# Patient Record
Sex: Female | Born: 1995
Health system: Southern US, Community
[De-identification: ages and names within clinical notes are randomized; demographics above are authoritative.]

## PROBLEM LIST (undated history)

## (undated) DIAGNOSIS — IMO0002 Reserved for concepts with insufficient information to code with codable children: Secondary | ICD-10-CM

## (undated) DIAGNOSIS — D649 Anemia, unspecified: Secondary | ICD-10-CM

## (undated) DIAGNOSIS — E669 Obesity, unspecified: Secondary | ICD-10-CM

## (undated) DIAGNOSIS — R569 Unspecified convulsions: Secondary | ICD-10-CM

## (undated) DIAGNOSIS — F909 Attention-deficit hyperactivity disorder, unspecified type: Secondary | ICD-10-CM

## (undated) DIAGNOSIS — R55 Syncope and collapse: Secondary | ICD-10-CM

## (undated) DIAGNOSIS — F79 Unspecified intellectual disabilities: Secondary | ICD-10-CM

## (undated) DIAGNOSIS — I10 Essential (primary) hypertension: Secondary | ICD-10-CM

## (undated) DIAGNOSIS — T7840XA Allergy, unspecified, initial encounter: Secondary | ICD-10-CM

## (undated) DIAGNOSIS — E119 Type 2 diabetes mellitus without complications: Secondary | ICD-10-CM

## (undated) HISTORY — DX: Obesity, unspecified: E66.9

## (undated) HISTORY — DX: Syncope and collapse: R55

## (undated) HISTORY — PX: NO PAST SURGERIES: SHX2092

## (undated) HISTORY — DX: Attention-deficit hyperactivity disorder, unspecified type: F90.9

## (undated) HISTORY — DX: Reserved for concepts with insufficient information to code with codable children: IMO0002

## (undated) HISTORY — DX: Anemia, unspecified: D64.9

## (undated) HISTORY — DX: Allergy, unspecified, initial encounter: T78.40XA

## (undated) HISTORY — DX: Type 2 diabetes mellitus without complications: E11.9

## (undated) HISTORY — DX: Unspecified convulsions: R56.9

## (undated) HISTORY — DX: Essential (primary) hypertension: I10

---
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
## 2013-01-11 ENCOUNTER — Telehealth: Payer: Self-pay | Admitting: Family Medicine

## 2013-01-11 NOTE — Telephone Encounter (Signed)
Received Request for prior approval of incontinence supplies. Form includes assessment requiring office visit. Please call and have appointment scheduled. Thank you - RBG

## 2013-01-11 NOTE — Telephone Encounter (Deleted)
Can you please call to schedule an appointment so I can accurately

## 2013-02-11 ENCOUNTER — Other Ambulatory Visit: Payer: Self-pay

## 2013-05-28 ENCOUNTER — Encounter: Payer: Self-pay | Admitting: Pediatrics

## 2013-05-28 ENCOUNTER — Ambulatory Visit (INDEPENDENT_AMBULATORY_CARE_PROVIDER_SITE_OTHER): Payer: Medicaid Other | Admitting: Pediatrics

## 2013-05-28 VITALS — Ht 61.0 in | Wt 252.0 lb

## 2013-05-28 DIAGNOSIS — E669 Obesity, unspecified: Secondary | ICD-10-CM

## 2013-05-28 DIAGNOSIS — F72 Severe intellectual disabilities: Secondary | ICD-10-CM

## 2013-05-28 DIAGNOSIS — B070 Plantar wart: Secondary | ICD-10-CM

## 2013-05-28 DIAGNOSIS — Z68.41 Body mass index (BMI) pediatric, greater than or equal to 95th percentile for age: Secondary | ICD-10-CM

## 2013-05-28 DIAGNOSIS — N912 Amenorrhea, unspecified: Secondary | ICD-10-CM

## 2013-05-28 DIAGNOSIS — Z00129 Encounter for routine child health examination without abnormal findings: Secondary | ICD-10-CM

## 2013-05-28 DIAGNOSIS — L83 Acanthosis nigricans: Secondary | ICD-10-CM

## 2013-05-28 DIAGNOSIS — E1169 Type 2 diabetes mellitus with other specified complication: Secondary | ICD-10-CM | POA: Insufficient documentation

## 2013-05-28 DIAGNOSIS — E119 Type 2 diabetes mellitus without complications: Secondary | ICD-10-CM | POA: Insufficient documentation

## 2013-05-28 LAB — HEMOGLOBIN A1C
HEMOGLOBIN A1C: 6.2 % — AB (ref <5.7)
Mean Plasma Glucose: 131 mg/dL — ABNORMAL HIGH (ref <117)

## 2013-05-28 MED ORDER — METFORMIN HCL 500 MG PO TABS: 500.0000 mg | ORAL_TABLET | Freq: Two times a day (BID) | ORAL | Status: DC

## 2013-05-28 MED ORDER — CETIRIZINE HCL 10 MG PO TABS: 10.0000 mg | ORAL_TABLET | Freq: Every day | ORAL | Status: DC

## 2013-05-28 NOTE — Progress Notes (Signed)
Routine Well-Adolescent Visit   History was provided by the mother.  Amanda Snyder is a 18 y.o. female who is here for Adolescent PE, New patient.  Past Medical History  Diagnosis Date  . Mental retardation 1997    Severe MR. did not walk until age 58-3 years, received Early Intervention.  . Seizures From age 39 month to 6 months    took Phenobarbital for a few years. weaned off after moving from [place] to [place]. Unknown etiology.  . Premature birth with gestation of 35-36 weeks     [redacted] weeks GA  . ADHD (attention deficit hyperactivity disorder) 2000    mom opted not to give medications (failed Adderall - 'zombielike', failed Strattera - 'no difference')  . Obesity 12  . Allergy     suspected allergy to pollen or dog(s)  . Syncope 2006    witnessed by sister, no workup   Current concerns:  (1) sore on bottom of right foot - For about a month, bottom of foot has had a 'sore' on it, as if she stepped on something, but it is not resolving. Not painful, not changing over time.  (2) dark skin on neck  - and "buffalo hump" increasing in size. This has worsened over the past 4-5 years, as patient's weight has "ballooned". She reportedly eats constantly, voracious appetite.  (3) no menstrual period x 6 months - Wears adult diapers at night, when out in public, and during menstrual periods. Mom did do home pregnancy test when patient missed her period (negative). No concerns for sexual abuse, but mom is not with Amanda Snyder 100% of the time (has [employer], attends school).  (4) needs DME ordered (adult diapers, new bed, etc.)  Alvis Lemmings is the CAP provider. Dezyrae has a [employer] 3:30-9:15 Mon-Fri. During school year (more during summer) plus 500-600 hours respite per year.  (5) Patient had a "swollen throat" once which improved with OTC oral benadryl. Since then, Mom treats with Benadryl once a day for suspected 'allergy' to dogs or pollen.   Family history:  Family History  Problem Relation Age of  Onset  . Stroke Mother 65  . Cancer Maternal Grandmother 48    breast ca  . Psoriasis Mother     Adolescent Assessment:  Confidentiality was discussed with the patient and if applicable, with caregiver as well.  Home and Environment:  Lives with: mother and younger sister. Parental relations: Mom is now retired Friends/Peers: limited Nutrition/Eating Behaviors: voracious appetite Sports/Exercise:  Relatively sedentary  Education and Employment:  School Status: Attends [employer] school on [employer] - will age out at 17 years. 5 students in classroom with one [facility] teacher and 2 aides. Has IEP.  Activities: requires assistance with all ADLs.  Sexuality:  -Menarche: post menarchal, onset ? Years ago - females:  last menses: > 6 months ago - Menstrual History: irregular now, previously regular, mom thinks  - Sexually active? no  - sexual partners in last year: 0 - contraception use: no method - Last STI Screening: never  - Violence/Abuse: mom denies  Screenings: PHQ-9 - patient unable to complete due to MR/developmental delays.  Review of Systems:  Constitutional:   Denies fever  Vision: Denies concerns about vision  HENT: Denies concerns about hearing, snoring  Lungs:   Denies difficulty breathing  Heart:   Denies chest pain  Gastrointestinal:   Denies abdominal pain, constipation, diarrhea  Genitourinary:   Denies dysuria  Neurologic:   Denies headaches  Physical Exam:    Filed Vitals:   05/28/13 1454  Height: 5' 1"  (1.549 m)  Weight: 252 lb (114.306 kg)    General Appearance:   alert, oriented, no acute distress, obese and pleasant demeanor, relatively cooperative  HENT: Normocephalic, no obvious abnormality, PERRL, EOM's intact, conjunctiva clear  Mouth:   Normal appearing teeth, no obvious discoloration, dental caries, or dental caps  Neck:   Supple; thyroid: no enlargement, symmetric, no tenderness/mass/nodules  Lungs:    Clear to auscultation bilaterally, normal work of breathing  Heart:   Regular rate and rhythm, S1 and S2 normal, no murmurs;   Abdomen:   Soft, non-tender, significant centripedal obesity  GU genitalia not examined     Lymphatic:   No cervical adenopathy  Skin/Hair/Nails:   Skin warm, dry and intact, no rashes, no bruises or petechiae, central right sole with <1 cm hyperpigmented, scaly round lesion with hard underlying layers of skin  Neurologic:   Strength, gait, and coordination normal and age-appropriate    Assessment/Plan:  1. Well adolescent visit - new patient, establishing care (desires Dr. Tamala Julian at [facility] until ages out of practice at 18 years of age). - GC/chlamydia probe amp, urine  2. Obesity 3. Acanthosis nigricans 4. Amenorrhea - Pregnancy, urine - start Metformin 535m BID - evaluate for possible PCOS - CONSIDER Cushing syndrome for 'buffalo hump', moon facies, weight gain  - labs ordered today: - Luteinizing hormone - DHEA-sulfate - Follicle stimulating hormone - Hemoglobin A1c - Prolactin - Testosterone - TSH - Lipid Profile  5. Plantar wart of right foot - recommended home treatment with duct tape/pumice stone and/or 'compound W'. - handout/information given; no signs of inflammation, low suspicion for foreign body  6. Mental Retardation, Severe - per mom, patient NOT Autistic - Mother maintains guardianship (beyond 160years of age).  Weight management:  The patient was counseled regarding nutrition and physical activity. [facility]  Immunizations today: unable to give recommended vaccines due to age [age]years. [facility], etc[facility] - counseled re: recommended shots.  - Follow-up visit in 3 months for next visit, or sooner as needed.

## 2013-05-28 NOTE — Patient Instructions (Signed)
Metformin tablets What is this medicine? METFORMIN (met FOR min) is used to treat type 2 diabetes. It helps to control blood sugar. Treatment is combined with diet and exercise. This medicine can be used alone or with other medicines for diabetes. This medicine may be used for other purposes; ask your health care provider or pharmacist if you have questions. COMMON BRAND NAME(S): Glucophage What should I tell my health care provider before I take this medicine? They need to know if you have any of these conditions: -anemia -frequently drink alcohol-containing beverages -become easily dehydrated -heart attack -heart failure that is treated with medications -kidney disease -liver disease -polycystic ovary syndrome -serious infection or injury -vomiting -an unusual or allergic reaction to metformin, other medicines, foods, dyes, or preservatives -pregnant or trying to get pregnant -breast-feeding How should I use this medicine? Take this medicine by mouth. Take it with meals. Swallow the tablets with a drink of water. Follow the directions on the prescription label. Take your medicine at regular intervals. Do not take your medicine more often than directed. Talk to your pediatrician regarding the use of this medicine in children. While this drug may be prescribed for children as young as 76 years of age for selected conditions, precautions do apply. Overdosage: If you think you have taken too much of this medicine contact a poison control center or emergency room at once. NOTE: This medicine is only for you. Do not share this medicine with others. What if I miss a dose? If you miss a dose, take it as soon as you can. If it is almost time for your next dose, take only that dose. Do not take double or extra doses. What should I watch for while using this medicine? Visit your doctor or health care professional for regular checks on your progress. A test called the HbA1C (A1C) will be  monitored. This is a simple blood test. It measures your blood sugar control over the last 2 to 3 months. You will receive this test every 3 to 6 months. Learn how to check your blood sugar. Learn the symptoms of low and high blood sugar and how to manage them. Always carry a quick-source of sugar with you in case you have symptoms of low blood sugar. Examples include hard sugar candy or glucose tablets. Make sure others know that you can choke if you eat or drink when you develop serious symptoms of low blood sugar, such as seizures or unconsciousness. They must get medical help at once. Tell your doctor or health care professional if you have high blood sugar. You might need to change the dose of your medicine. If you are sick or exercising more than usual, you might need to change the dose of your medicine. Do not skip meals. Ask your doctor or health care professional if you should avoid alcohol. Many nonprescription cough and cold products contain sugar or alcohol. These can affect blood sugar. This medicine may cause ovulation in premenopausal women who do not have regular monthly periods. This may increase your chances of becoming pregnant. You should not take this medicine if you become pregnant or think you may be pregnant. Talk with your doctor or health care professional about your birth control options while taking this medicine. Contact your doctor or health care professional right away if think you are pregnant. If you are going to need surgery, a MRI, CT scan, or other procedure, tell your doctor that you are taking this medicine. You may need  to stop taking this medicine before the procedure. Wear a medical ID bracelet or chain, and carry a card that describes your disease and details of your medicine and dosage times. What side effects may I notice from receiving this medicine? Side effects that you should report to your doctor or health care professional as soon as possible: -allergic  reactions like skin rash, itching or hives, swelling of the face, lips, or tongue -breathing problems -feeling faint or lightheaded, falls -muscle aches or pains -signs and symptoms of low blood sugar such as feeling anxious, confusion, dizziness, increased hunger, unusually weak or tired, sweating, shakiness, cold, irritable, headache, blurred vision, fast heartbeat, loss of consciousness -slow or irregular heartbeat -unusual stomach pain or discomfort -unusually tired or weak Side effects that usually do not require medical attention (report to your doctor or health care professional if they continue or are bothersome): -diarrhea -headache -heartburn -metallic taste in mouth -nausea -stomach gas, upset This list may not describe all possible side effects. Call your doctor for medical advice about side effects. You may report side effects to FDA at 1-800-FDA-1088. Where should I keep my medicine? Keep out of the reach of children. Store at room temperature between 15 and 30 degrees C (59 and 86 degrees F). Protect from moisture and light. Throw away any unused medicine after the expiration date. NOTE: This sheet is a summary. It may not cover all possible information. If you have questions about this medicine, talk to your doctor, pharmacist, or health care provider.  2014, Elsevier/Gold Standard. (2012-07-07 16:03:44) Plantar Wart Warts are benign (noncancerous) growths of the outer skin layer. They can occur at any time in life but are most common during childhood and the teen years. Warts can occur on many skin surfaces of the body. When they occur on the underside (sole) of your foot they are called plantar warts. They often emerge in groups with several small warts encircling a larger growth. CAUSES  Human papillomavirus (HPV) is the cause of plantar warts. HPV attacks a break in the skin of the foot. Walking barefoot can lead to exposure to the wart virus. Plantar warts tend to  develop over areas of pressure such as the heel and ball of the foot. Plantar warts often grow into the deeper layers of skin. They may spread to other areas of the sole but cannot spread to other areas of the body. SYMPTOMS  You may also notice a growth on the undersurface of your foot. The wart may grow directly into the sole of the foot, or rise above the surface of the skin on the sole of the foot, or both. They are most often flat from pressure. Warts generally do not cause itching but may cause pain in the area of the wart when you put weight on your foot. DIAGNOSIS  Diagnosis is made by physical examination. This means your caregiver discovers it while examining your foot.  TREATMENT  There are many ways to treat plantar warts. However, warts are very tough. Sometimes it is difficult to treat them so that they go away completely and do not grow back. Any treatment must be done regularly to work. If left untreated, most plantar warts will eventually disappear over a period of one to two years. Treatments you can do at home include:  Putting duct tape over the top of the wart (occlusion), has been found to be effective over several months. The duct tape should be removed each night and reapplied until the  wart has disappeared.  Placing over-the-counter medications on top of the wart to help kill the wart virus and remove the wart tissue (salicylic acid, cantharidin, and dichloroacetic acid ) are useful. These are called keratolytic agents. These medications make the skin soft and gradually layers will shed away. Theses compounds are usually placed on the wart each night and then covered with a band-aid. They are also available in pre-medicated band-aid form. Avoid surrounding skin when applying these liquids as these medications can burn healthy skin. The treatment may take several months of nightly use to be effective.  Cryotherapy to freeze the wart has recently become available over-the-counter  for children 4 years and older. This system makes use of a soft narrow applicator connected to a bottle of compressed cold liquid that is applied directly to the wart. This medication can burn health skin and should be used with caution.  As with all over-the-counter medications, read the directions carefully before use. Treatments generally done in your caregiver's office include:  Some aggressive treatments may cause discomfort, discoloration and scaring of the surrounding skin. The risks and benefits of treatment should be discussed with your caregiver.  Freezing the wart with liquid nitrogen (cryotherapy, see above).  Burning the wart with use of very high heat (cautery).  Injecting medication into the wart.  Surgically removing or laser treatment of the wart.  Your caregiver may refer you to a dermatologist for difficult to treat, large sized or large numbers of warts. HOME CARE INSTRUCTIONS   Soak the affected area in warm water. Dry the area completely when you are done. Remove the top layer of softened skin, then apply the chosen topical medication and reapply a bandage.  Remove the bandage daily and file excess wart tissue (pumice stone works well for this purpose). Repeat the entire process daily or every other day for weeks until the plantar wart disappears.  Several brands of salicylic acid pads are available as over-the-counter remedies.  Pain can be relieved by wearing a doughnut bandage. This is a bandage with a hole in it. The bandage is put on with the hole over the wart. This helps take the pressure off the wart and gives pain relief. To help prevent plantar warts:  Wear shoes and socks and change them daily.  Keep feet clean and dry.  Check your feet and your children's feet regularly.  Avoid direct contact with warts on other people.  Have growths, or changes on your skin checked by your caregiver. Document Released: 06/15/2003 Document Revised: 06/17/2011  Document Reviewed: 11/23/2008 [facility] Patient Information 2014 [place].

## 2013-05-28 NOTE — Progress Notes (Signed)
Concerns: Bottom of right foot has a knot, BS checked, menstrual cycle stopped 6 months ago. Unable to obtain BP due to patient moving.

## 2013-05-29 LAB — LIPID PANEL
CHOL/HDL RATIO: 3.7 ratio
CHOLESTEROL: 172 mg/dL (ref 0–200)
HDL: 47 mg/dL (ref >39)
LDL Cholesterol: 108 mg/dL — ABNORMAL HIGH (ref 0–99)
Triglycerides: 85 mg/dL (ref <150)
VLDL: 17 mg/dL (ref 0–40)

## 2013-05-29 LAB — FOLLICLE STIMULATING HORMONE: FSH: 8.3 m[IU]/mL

## 2013-05-29 LAB — LUTEINIZING HORMONE: LH: 15.6 m[IU]/mL

## 2013-05-29 LAB — PROLACTIN: Prolactin: 9.4 ng/mL

## 2013-05-29 LAB — TSH: TSH: 1.811 u[IU]/mL (ref 0.350–4.500)

## 2013-05-29 LAB — TESTOSTERONE: Testosterone: 94 ng/dL — ABNORMAL HIGH (ref 15–40)

## 2013-05-31 LAB — DHEA-SULFATE: DHEA SO4: 230 ug/dL (ref 35–430)

## 2013-06-18 ENCOUNTER — Telehealth: Payer: Self-pay | Admitting: Pediatrics

## 2013-06-18 NOTE — Telephone Encounter (Signed)
[facility] called and stated that they received PT eval and incontinence supply order but there was no amounts for supplies. Medicaid approves 200 pull ups monthly, 150 chux monthly, and 150 wipes every 6 months. Per Dr.McCormick the Medicaid approved amounts were given to [facility] for patient to receive.

## 2013-06-21 NOTE — Telephone Encounter (Signed)
I called this morning and they were going to have the correct person call me back.

## 2013-06-25 NOTE — Telephone Encounter (Signed)
An incontinence supply order was faxed 06/24/2013

## 2013-07-28 ENCOUNTER — Ambulatory Visit: Payer: Medicaid Other | Admitting: Dietician

## 2013-08-05 ENCOUNTER — Encounter: Payer: Self-pay | Admitting: Dietician

## 2013-08-05 ENCOUNTER — Encounter: Payer: Medicaid Other | Attending: Pediatrics | Admitting: Dietician

## 2013-08-05 VITALS — Ht 61.0 in | Wt 261.5 lb

## 2013-08-05 DIAGNOSIS — E669 Obesity, unspecified: Secondary | ICD-10-CM | POA: Insufficient documentation

## 2013-08-05 DIAGNOSIS — F79 Unspecified intellectual disabilities: Secondary | ICD-10-CM | POA: Insufficient documentation

## 2013-08-05 DIAGNOSIS — Z713 Dietary counseling and surveillance: Secondary | ICD-10-CM | POA: Insufficient documentation

## 2013-08-05 DIAGNOSIS — R4701 Aphasia: Secondary | ICD-10-CM | POA: Insufficient documentation

## 2013-08-05 DIAGNOSIS — R7309 Other abnormal glucose: Secondary | ICD-10-CM | POA: Insufficient documentation

## 2013-08-05 NOTE — Progress Notes (Signed)
  Medical Nutrition Therapy:  Appt start time: 1600 end time:  1630.   Assessment:  Primary concerns today: [place] is here today with her mom. She is mentally challenged and I spent most of our visit communicating with her mom. Her mom is concerned because [place] is obese and was recently diagnosed with prediabetes. Mom states that thinks she is getting too much food overall. [place] attends, [employer], a school for the [employer]. [place] receives food at school and canned food to take home.   Preferred Learning Style:   Patient has special needs and unable to communicate  Learning Readiness:   Ready  MEDICATIONS: see list; recently started Metformin   DIETARY INTAKE:  24-hr recall:  B ( AM): School breakfast  Snk ( AM):   L ( PM): school lunch  Snk ( PM): Oodles and Noodles D ( PM): regular family dinner Snk ( PM):   Beverages: soda but trying to drink less, fruit juice, sweet tea  Usual physical activity: none  Estimated energy needs: 2000 calories 225 g carbohydrates 150 g protein 56 g fat  Progress Towards Goal(s):  Some progress.   Nutritional Diagnosis:  [place]-2.2 Altered nutrition-related laboratory As related to obesity, excess energy and carbohydrate intake, and physical inactivity.  As evidenced by diagnosis of prediabetes.    Intervention:  Nutrition counseling provided.  Teaching Method Utilized:  Visual Auditory  Handouts given during visit include:  15g CHO + protein snacks  Barriers to learning/adherence to lifestyle change: mentally challenged  Demonstrated degree of understanding via:  Teach Back   Monitoring/Evaluation:  Dietary intake, exercise, and body weight in 3 month(s).

## 2013-08-05 NOTE — Patient Instructions (Addendum)
-  Reduce sugar in drinks  -Sparkling water, water with lemon or lime, sugarfree flavoring  -Have a small snack afterschool  -Increase physical activity as tolerated

## 2013-08-25 ENCOUNTER — Ambulatory Visit (INDEPENDENT_AMBULATORY_CARE_PROVIDER_SITE_OTHER): Payer: Medicaid Other | Admitting: Pediatrics

## 2013-08-25 ENCOUNTER — Encounter: Payer: Self-pay | Admitting: Pediatrics

## 2013-08-25 VITALS — BP 128/98 | Ht 61.5 in | Wt 259.6 lb

## 2013-08-25 DIAGNOSIS — R030 Elevated blood-pressure reading, without diagnosis of hypertension: Secondary | ICD-10-CM

## 2013-08-25 DIAGNOSIS — N912 Amenorrhea, unspecified: Secondary | ICD-10-CM

## 2013-08-25 DIAGNOSIS — F72 Severe intellectual disabilities: Secondary | ICD-10-CM

## 2013-08-25 DIAGNOSIS — L83 Acanthosis nigricans: Secondary | ICD-10-CM

## 2013-08-25 DIAGNOSIS — E669 Obesity, unspecified: Secondary | ICD-10-CM

## 2013-08-25 DIAGNOSIS — I10 Essential (primary) hypertension: Secondary | ICD-10-CM | POA: Insufficient documentation

## 2013-08-25 NOTE — Progress Notes (Signed)
History was provided by the patient and Paediatric nurse.  Amanda Snyder is a 18 y.o. female who is here for follow up of her Obesity, Acanthosis Nigricans, and Lab results (drawn last visit), amenorrhea.  HPI:  Amanda Snyder was last seen 3 months ago. Since then, she did start Metformin 547m, but mom has only been giving once daily instead of BID. She continues to not have a menstrual period (last one ~ a year ago?). She never collected a urine sample as requested at last visit. She has gained 7 lbs since last visit (over 3 months), but mom does report trying to eat more green leafy vegetables, and they have attended one Nutrition Counseling session, with plan to return again.  Reviewed lab results from February with mother. Answered questions, explained elevated testosterone, increasing HgbA1C compared to last lab value.  Patient Active Problem List   Diagnosis Date Noted  . Acanthosis nigricans 05/28/2013  . Amenorrhea 05/28/2013  . Hyperglycemia 12/26/2011  . Incontinence 09/06/2011  . Paronychia 08/22/2010  . Obesity 08/08/2010  . Kyphosis 08/08/2010  . ADHD 12/18/2006  . RETARDATION, MENTAL, SEVERE 12/18/2006    Current Outpatient Prescriptions on File Prior to Visit  Medication Sig Dispense Refill  . cetirizine (ZYRTEC) 10 MG tablet Take 1 tablet (10 mg total) by mouth daily.  30 tablet  2  . metFORMIN (GLUCOPHAGE) 500 MG tablet Take 1 tablet (500 mg total) by mouth 2 (two) times daily with a meal.  60 tablet  3   No current facility-administered medications on file prior to visit.    The following portions of the patient's history were reviewed and updated as appropriate: allergies, current medications, past medical history, past social history and problem list.  Physical Exam:    Filed Vitals:   08/25/13 1653  BP: 128/98  Height: 5' 1.5" (1.562 m)  Weight: 259 lb 9.6 oz (117.754 kg)   Growth parameters are noted and are not appropriate for age. Facility age limit for  growth percentiles is 20 years. No LMP recorded.    General:   cooperative, morbidly obese and slowed mentation     Skin:   hyperpigmented, thickened skin on posterior neck              Lungs:  clear to auscultation bilaterally  Heart:   regular rate and rhythm, S1, S2 normal, no murmur, click, rub or gallop                Assessment/Plan:  Amanda Snyder was seen today for follow-up.  Diagnoses and associated orders for this visit:  Obesity - Ambulatory referral to Adolescent Medicine  Amenorrhea - Ambulatory referral to Adolescent Medicine  Acanthosis nigricans - Ambulatory referral to Adolescent Medicine  Elevated blood pressure reading without diagnosis of hypertension - Ambulatory referral to Adolescent Medicine  RETARDATION, MENTAL, SEVERE - Ambulatory referral to Adolescent Medicine   - Immunizations today: none  - Follow-up visit in 3 months for recheck Weight, or sooner as needed.   Time spent: >35 minutes face to face, with >50% counseling on medication recommendations, explanations of lab results including medical significance of each, weight loss counseling including dietary goal-setting and exercise goal-setting, importance of blood pressure monitoring, and discussion around adolescent medicine services referral.

## 2013-08-25 NOTE — Patient Instructions (Signed)
Polycystic Ovarian Syndrome Polycystic ovarian syndrome (PCOS) is a common hormonal disorder among women of reproductive age. Most women with PCOS grow many small cysts on their ovaries. PCOS can cause problems with your periods and make it difficult to get pregnant. It can also cause an increased risk of miscarriage with pregnancy. If left untreated, PCOS can lead to serious health problems, such as diabetes and heart disease. CAUSES The cause of PCOS is not fully understood, but genetics may be a factor. SIGNS AND SYMPTOMS   Infrequent or no menstrual periods.   Inability to get pregnant (infertility) because of not ovulating.   Increased growth of hair on the face, chest, stomach, back, thumbs, thighs, or toes.   Acne, oily skin, or dandruff.   Pelvic pain.   Weight gain or obesity, usually carrying extra weight around the waist.   Type 2 diabetes.   High cholesterol.   High blood pressure.   Female-pattern baldness or thinning hair.   Patches of thickened and dark brown or black skin on the neck, arms, breasts, or thighs.   Tiny excess flaps of skin (skin tags) in the armpits or neck area.   Excessive snoring and having breathing stop at times while asleep (sleep apnea).   Deepening of the voice.   Gestational diabetes when pregnant.  DIAGNOSIS  There is no single test to diagnose PCOS.   Your health care provider will:   Take a medical history.   Perform a pelvic exam.   Have ultrasonography done.   Check your female and female hormone levels.   Measure glucose or sugar levels in the blood.   Do other blood tests.   If you are producing too many female hormones, your health care provider will make sure it is from PCOS. At the physical exam, your health care provider will want to evaluate the areas of increased hair growth. Try to allow natural hair growth for a few days before the visit.   During a pelvic exam, the ovaries may be enlarged  or swollen because of the increased number of small cysts. This can be seen more easily by using vaginal ultrasonography or screening to examine the ovaries and lining of the uterus (endometrium) for cysts. The uterine lining may become thicker if you have not been having a regular period.  TREATMENT  Because there is no cure for PCOS, it needs to be managed to prevent problems. Treatments are based on your symptoms. Treatment is also based on whether you want to have a baby or whether you need contraception.  Treatment may include:   Progesterone hormone to start a menstrual period.   Birth control pills to make you have regular menstrual periods.   Medicines to make you ovulate, if you want to get pregnant.   Medicines to control your insulin.   Medicine to control your blood pressure.   Medicine and diet to control your high cholesterol and triglycerides in your blood.  Medicine to reduce excessive hair growth.  Surgery, making small holes in the ovary, to decrease the amount of female hormone production. This is done through a long, lighted tube (laparoscope) placed into the pelvis through a tiny incision in the lower abdomen.  HOME CARE INSTRUCTIONS  Only take over-the-counter or prescription medicine as directed by your health care provider.  Pay attention to the foods you eat and your activity levels. This can help reduce the effects of PCOS.  Keep your weight under control.  Eat foods that are  low in carbohydrate and high in fiber.  Exercise regularly. SEEK MEDICAL CARE IF:  Your symptoms do not get better with medicine.  You have new symptoms. Document Released: 07/19/2004 Document Revised: 01/13/2013 Document Reviewed: 09/10/2012 [facility] Patient Information 2014 [place], [place]. Cholesterol Cholesterol is a type of fat. Your body needs a small amount of cholesterol, but too much can cause health problems. Certain problems include heart attacks, strokes, and not  enough blood flow to your heart, brain, kidneys, or feet. You get cholesterol in 2 ways:  Naturally.  By eating certain foods. HOME CARE  Eat a low-fat diet:  Eat less eggs, whole dairy products (whole milk, cheese, and butter), fatty meats, and fried foods.  Eat more fruits, vegetables, whole-wheat breads, lean chicken, and fish.  Follow your exercise program as told by your doctor.  Keep your weight at a healthy level. Talk to your doctor about what is right for you.  Only take medicine as told by your doctor.  Get your cholesterol checked once a year or as told by your doctor. MAKE SURE YOU:  Understand these instructions.  Will watch your condition.  Will get help right away if you are not doing well or get worse. Document Released: 06/21/2008 Document Revised: 06/17/2011 Document Reviewed: 01/06/2013 [facility] Patient Information 2014 [place], [place].

## 2013-09-21 ENCOUNTER — Other Ambulatory Visit: Payer: Self-pay | Admitting: Pediatrics

## 2013-09-21 DIAGNOSIS — T782XXA Anaphylactic shock, unspecified, initial encounter: Secondary | ICD-10-CM

## 2013-09-21 MED ORDER — EPINEPHRINE 0.3 MG/0.3ML IJ SOAJ
0.3000 mg | Freq: Once | INTRAMUSCULAR | Status: DC | PRN
Start: 2013-09-21 — End: 2014-11-02

## 2013-09-21 NOTE — Progress Notes (Signed)
Mom informed that last week patient had abrupt onset of shortness of breath, vomiting, and diarrhea immediately after ingestion of meal with spinache and crab meat.  Mom gave benadryl which partially relieved symptoms.   -Will make referral to allergy. -Rx provided for Epi Pen.       Janit Bern, MD [facility], PGY-2 09/21/2013 5:29 PM

## 2013-10-29 ENCOUNTER — Other Ambulatory Visit: Payer: Self-pay | Admitting: Pediatrics

## 2013-11-11 ENCOUNTER — Encounter: Payer: Self-pay | Admitting: Pediatrics

## 2013-11-11 ENCOUNTER — Ambulatory Visit (INDEPENDENT_AMBULATORY_CARE_PROVIDER_SITE_OTHER): Payer: Medicaid Other | Admitting: Pediatrics

## 2013-11-11 VITALS — BP 106/74 | Ht 62.56 in | Wt 253.0 lb

## 2013-11-11 DIAGNOSIS — Z789 Other specified health status: Secondary | ICD-10-CM | POA: Insufficient documentation

## 2013-11-11 DIAGNOSIS — E282 Polycystic ovarian syndrome: Secondary | ICD-10-CM

## 2013-11-11 DIAGNOSIS — T7800XA Anaphylactic reaction due to unspecified food, initial encounter: Secondary | ICD-10-CM | POA: Insufficient documentation

## 2013-11-11 DIAGNOSIS — F909 Attention-deficit hyperactivity disorder, unspecified type: Secondary | ICD-10-CM

## 2013-11-11 DIAGNOSIS — F72 Severe intellectual disabilities: Secondary | ICD-10-CM

## 2013-11-11 DIAGNOSIS — L83 Acanthosis nigricans: Secondary | ICD-10-CM

## 2013-11-11 LAB — HEMOGLOBIN A1C
Hgb A1c MFr Bld: 5.8 % — ABNORMAL HIGH (ref <5.7)
Mean Plasma Glucose: 120 mg/dL — ABNORMAL HIGH (ref <117)

## 2013-11-11 LAB — COMPREHENSIVE METABOLIC PANEL
ALK PHOS: 73 U/L (ref 39–117)
ALT: 80 U/L — ABNORMAL HIGH (ref 0–35)
AST: 39 U/L — ABNORMAL HIGH (ref 0–37)
Albumin: 4.3 g/dL (ref 3.5–5.2)
BILIRUBIN TOTAL: 0.3 mg/dL (ref 0.2–1.2)
BUN: 8 mg/dL (ref 6–23)
CO2: 27 mEq/L (ref 19–32)
Calcium: 10.3 mg/dL (ref 8.4–10.5)
Chloride: 100 mEq/L (ref 96–112)
Creat: 0.62 mg/dL (ref 0.50–1.10)
GLUCOSE: 93 mg/dL (ref 70–99)
Potassium: 4.1 mEq/L (ref 3.5–5.3)
SODIUM: 136 meq/L (ref 135–145)
Total Protein: 7.2 g/dL (ref 6.0–8.3)

## 2013-11-11 LAB — LIPID PANEL
CHOL/HDL RATIO: 3.7 ratio
Cholesterol: 154 mg/dL (ref 0–200)
HDL: 42 mg/dL (ref >39)
LDL CALC: 84 mg/dL (ref 0–99)
TRIGLYCERIDES: 140 mg/dL (ref <150)
VLDL: 28 mg/dL (ref 0–40)

## 2013-11-11 MED ORDER — METFORMIN HCL ER 500 MG PO TB24: 500.0000 mg | ORAL_TABLET | Freq: Every day | ORAL | Status: DC

## 2013-11-11 NOTE — Patient Instructions (Signed)
Take a multivitamin every day when you are on Metformin. Also take metformin with food.  Avoid starchy foods when you are taking Metformin.  Take Metformin XR 500 mg 1 pill at dinner once daily for 2 weeks  Then, take Metformin XR 500 mg 2 pills at dinner once daily for 2 weeks  Then, take Metformin XR 500 mg 3 pills at dinner once daily until you see the doctor for your next visit.  If you have too much nausea or diarrhea, decrease your dose for 2 weeks and then try to go back up again.

## 2013-11-11 NOTE — Progress Notes (Signed)
Adolescent Medicine Consultation Initial Visit Amanda Snyder  is a 18 y.o. female referred by Dr. Tamala Julian here today for evaluation of PCOS and to establish care.      PCP Confirmed?  yes  Omara Alcon, Victorino December, MD   History was provided by the patient, mother and CAP worker.  Chart review:  Seen for [facility] with Dr. Tamala Julian 05/28/2013.  She was start on metformin at that visit and evaluated for PCOS.  Labs are reviewed below.  She was seen by Dr. Drue Flirt on 08/25/13 and referred to [facility] for further evaluation.    Last STI screen: Not previously performed, difficulty with obtaining specimen. Pertinent Labs:  Component     Latest Ref Rng 05/28/2013  Cholesterol     0 - 200 mg/dL 172  Triglycerides     <150 mg/dL 85  HDL     >39 mg/dL 47  Total CHOL/HDL Ratio      3.7  VLDL     0 - 40 mg/dL 17  LDL (calc)     0 - 99 mg/dL 108 (H)  Hemoglobin A1C     <5.7 % 6.2 (H)  Mean Plasma Glucose     <117 mg/dL 131 (H)  LH      15.6  DHEA-SO4     35 - 430 ug/dL 230  FSH      8.3  Prolactin      9.4  Testosterone     15 - 40 ng/dL 94 (H)  TSH     0.350 - 4.500 uIU/mL 1.811   Previous Psych Screenings:  Not applicable due to severe intellectual disability Immunizations: Further review needed  Psych screenings completed for today's visit: None  HPI:  Pt reports she is here with concerned related to PCOS.  Report patient has mild acne.  No hirsutism.  Had a period recently, but had been amenorrheic prior to that for a long time. Had been giving once daily Metformin and then increased to twice daily but then recently stopped.  Mother felt unsure about giving it.  Noted a lot of GI upset with it.  They are working on dietary changes also.  They have decreased frequency of food intake.  Likes chik-filet and ice cream.  They are trying to limit that.  They are also thinking about joining a gym and having CAP workers take her there regularly.  Born with seizures, previously saw Dr.  Gaynell Face. Has previous diagnosis of ADHD, intellectual disability.  Tried Adderal and tried strattera.  Not on any medication for behavioral issues recently.  They are generally able to control her behaviors.  They are not sure whether she has autism.  Wt Readings from Last 3 Encounters:  11/11/13 253 lb (114.76 kg)  08/25/13 259 lb 9.6 oz (117.754 kg)  08/05/13 261 lb 8 oz (118.616 kg)   Patient's last menstrual period was 10/26/2013.  ROS per HPI  The following portions of the patient's history were reviewed and updated as appropriate: allergies, current medications, past family history, past medical history, past social history, past surgical history and problem list.  No Known Allergies  Physical Exam:  Filed Vitals:   11/11/13 1344  BP: 106/74  Height: 5' 2.56" (1.589 m)  Weight: 253 lb (114.76 kg)   BP 106/74  Ht 5' 2.56" (1.589 m)  Wt 253 lb (114.76 kg)  BMI 45.45 kg/m2  LMP 10/26/2013 Body mass index: body mass index is 45.45 kg/(m^2). Facility age limit for growth percentiles is 20  years.  Wt Readings from Last 3 Encounters:  11/11/13 253 lb (114.76 kg)  08/25/13 259 lb 9.6 oz (117.754 kg)  08/05/13 261 lb 8 oz (118.616 kg)   Physical Exam  Constitutional:  Alert, very active, some echolalia and intermittent agitation associated with uncertainty about the medical visit process  Neck: No thyromegaly present.  Cardiovascular: Normal rate and regular rhythm.   No murmur heard. Pulmonary/Chest: Breath sounds normal.  Abdominal: Soft. There is no tenderness. There is no guarding.  obese  Genitourinary:  External genital normal, brief view of clitorus showed mild enlargement  Musculoskeletal: She exhibits no edema.  ?buffalo hump vs. kyphosis  Lymphadenopathy:    She has no cervical adenopathy.  Skin: No rash noted.   Assessment/Plan: 1. PCOS (polycystic ovarian syndrome) 2. Acanthosis nigricans 3. Morbid obesity Labs are consistent with PCOS.  Dietary  changes already occuring and reinforced the importance of continued attention to diet and exercise.  Consider nutrition evaluation in future.  Advised that metformin is indicated given elevated hgba1c and PCOS.  Advised long-acting metformin may create fewer GI side effects.  Will start at 500 and increase slowly.  Advised to take with MVI.  Will evaluate further for comorbidities of PCOS as well as rule out any adrenal source of excess androgens. - Comprehensive metabolic panel - Vit D  25 hydroxy (rtn osteoporosis monitoring); Future - Lipid panel - Hemoglobin A1c - Cortisol - metFORMIN (GLUCOPHAGE XR) 500 MG 24 hr tablet; Take 1 tablet (500 mg total) by mouth daily with breakfast. And then increase as instructed by your doctor.  Dispense: 90 tablet; Refill: 1  4. RETARDATION, MENTAL, SEVERE 5.  ADHD Diagnosis unclear.  ?whether genetics eval indicated.  May not be at this point if not going to change any management decisions.  Behavior is intermittently challenging and want to ensure that family has clear understanding of diagnosis and long-term treatment needs. - Ambulatory referral to Development Ped  6. Anaphylaxis due to food, initial encounter Mother reported anaphylactic reaction to food.  Given limited verbal abilities of the patient, further testing and evaluation is indicated.  Family and caregivers have an epi-pen. - Ambulatory referral to Allergy  REQUESTS NO RESIDENTS IN FUTURE  Follow-up:  1 month  Medical decision-making:  > 45 minutes spent, more than 50% of appointment was spent discussing diagnosis and management of symptoms

## 2013-11-12 LAB — CORTISOL: Cortisol, Plasma: 13.2 ug/dL

## 2013-11-19 ENCOUNTER — Telehealth: Payer: Self-pay | Admitting: Pediatrics

## 2013-11-19 NOTE — Telephone Encounter (Signed)
Mom would like to know if you can please write her a letter stating that this pt is handicap so she can mail it to the city of [place]. Mom stated that she need this letter because they are going to accommodate her bathroom for the pt with a bigger toilet & bathtub, but they need the letter first.

## 2013-11-22 ENCOUNTER — Encounter: Payer: Medicare Other | Attending: Pediatrics | Admitting: Dietician

## 2013-11-22 DIAGNOSIS — Z713 Dietary counseling and surveillance: Secondary | ICD-10-CM | POA: Diagnosis not present

## 2013-11-22 NOTE — Progress Notes (Signed)
  Medical Nutrition Therapy:  Appt start time: 200 end time:  230.   Follow-up: Amanda Snyder returns with her sister, mom, and CAP worker. Has fluctuated in weight a little but overall has lost 1 pound since last visit in April. Her caregivers report feeding her 3 meals a day instead of 5. CAP worker is doing things to cause her to walk more, like parking father away from entrances. Menstrual cycle has come back and blood pressure has improved. Drinking more green tea and less soda. Having fruit snacks and chickfila ice cream occasionally afterschool.   Preferred Learning Style:   Patient has special needs and unable to communicate  Learning Readiness:   Ready  MEDICATIONS: see list; recently started Metformin   DIETARY INTAKE:  24-hr recall:  B ( AM): School breakfast  Snk ( AM):   L ( PM): school lunch  Snk ( PM): Oodles and Noodles D ( PM): regular family dinner Snk ( PM):   Beverages: soda but trying to drink less, fruit juice, sweet tea  Usual physical activity: none  Estimated energy needs: 2000 calories 225 g carbohydrates 150 g protein 56 g fat  Progress Towards Goal(s):  Some progress.   Nutritional Diagnosis:  [place]-2.2 Altered nutrition-related laboratory As related to obesity, excess energy and carbohydrate intake, and physical inactivity.  As evidenced by diagnosis of prediabetes.    Intervention:  Nutrition counseling provided.  Teaching Method Utilized:  Visual Auditory   Barriers to learning/adherence to lifestyle change: mentally challenged  Demonstrated degree of understanding via:  Teach Back   Monitoring/Evaluation:  Dietary intake, exercise, and body weight in 3 month(s).

## 2013-11-22 NOTE — Patient Instructions (Addendum)
-  Reduce sugar in drinks  -Sparkling water, water with lemon or lime, sugarfree flavoring  -Have a small snack afterschool  -Increase physical activity as tolerated  -Try some other forms of physical activity  -Water aerobics!  -Increase vegetable intake  -Unlimited amounts of non-starchy vegetables

## 2013-11-26 NOTE — Telephone Encounter (Signed)
Called and left a VM for mom that the letter is ready for pick up or to fax/mail.  Asked mom to call with clear directions on where and how to send.

## 2013-11-30 DIAGNOSIS — T7800XA Anaphylactic reaction due to unspecified food, initial encounter: Secondary | ICD-10-CM | POA: Diagnosis not present

## 2013-11-30 DIAGNOSIS — T7840XA Allergy, unspecified, initial encounter: Secondary | ICD-10-CM | POA: Diagnosis not present

## 2013-11-30 DIAGNOSIS — J309 Allergic rhinitis, unspecified: Secondary | ICD-10-CM | POA: Diagnosis not present

## 2013-12-16 ENCOUNTER — Encounter: Payer: Self-pay | Admitting: Pediatrics

## 2013-12-16 ENCOUNTER — Ambulatory Visit (INDEPENDENT_AMBULATORY_CARE_PROVIDER_SITE_OTHER): Payer: Medicaid Other | Admitting: Pediatrics

## 2013-12-16 VITALS — BP 124/86 | Wt 257.0 lb

## 2013-12-16 DIAGNOSIS — T7800XA Anaphylactic reaction due to unspecified food, initial encounter: Secondary | ICD-10-CM

## 2013-12-16 DIAGNOSIS — E282 Polycystic ovarian syndrome: Secondary | ICD-10-CM

## 2013-12-16 DIAGNOSIS — T7800XD Anaphylactic reaction due to unspecified food, subsequent encounter: Secondary | ICD-10-CM

## 2013-12-16 DIAGNOSIS — Z5189 Encounter for other specified aftercare: Secondary | ICD-10-CM

## 2013-12-16 DIAGNOSIS — R7309 Other abnormal glucose: Secondary | ICD-10-CM

## 2013-12-16 DIAGNOSIS — R7303 Prediabetes: Secondary | ICD-10-CM

## 2013-12-16 MED ORDER — METFORMIN HCL ER 500 MG PO TB24: 1500.0000 mg | ORAL_TABLET | Freq: Every day | ORAL | Status: DC

## 2013-12-16 NOTE — Progress Notes (Signed)
Adolescent Medicine Consultation Follow-Up Visit Amanda Snyder  is a 18 y.o. female here today for follow-up of PCOS.   PCP Confirmed?  yes  PERRY, Amanda December, MD   History was provided by the mother.  Chart review:  Last seen by Dr. Henrene Snyder on 11/11/13.  Treatment plan at last visit included checking labs, start Metformin, referral for Dev-Beh eval, referral for allergy testing.   Last STI screen: Needs to be collected Pertinent Labs:  Component     Latest Ref Rng 11/11/2013  Sodium     135 - 145 mEq/L 136  Potassium     3.5 - 5.3 mEq/L 4.1  Chloride     96 - 112 mEq/L 100  CO2     19 - 32 mEq/L 27  Glucose     70 - 99 mg/dL 93  BUN     6 - 23 mg/dL 8  Creatinine     0.50 - 1.10 mg/dL 0.62  Total Bilirubin     0.2 - 1.2 mg/dL 0.3  Alkaline Phosphatase     39 - 117 U/L 73  AST     0 - 37 U/L 39 (H)  ALT     0 - 35 U/L 80 (H)  Total Protein     6.0 - 8.3 g/dL 7.2  Albumin     3.5 - 5.2 g/dL 4.3  Calcium     8.4 - 10.5 mg/dL 10.3  Cholesterol     0 - 200 mg/dL 154  Triglycerides     <150 mg/dL 140  HDL     >39 mg/dL 42  Total CHOL/HDL Ratio      3.7  VLDL     0 - 40 mg/dL 28  LDL (calc)     0 - 99 mg/dL 84  Hemoglobin A1C     <5.7 % 5.8 (H)  Mean Plasma Glucose     <117 mg/dL 120 (H)  Cortisol, Plasma      13.2   Previous Pysch Screenings: None Immunizations: Record incomplete  Psych Screenings completed for today's visit: None  HPI:  Pt has limited verbal ability.  Mother reports patient has been doing well.  They are working on exercise and eating changes.  They feel they have sufficient information from nutrition and are not planning a f/u visit.  They say allergist and patient has allergy to clams and shrimp.  They have an epi-pen.  They do not need any follow-up with allergy unless new symptoms occur.  Is up to 3 tabs per day of metformin.  When giving at night she has worse bowel control.  They will try giving with breakfast daily.  They  have an appt with Dr. Quentin Snyder for diagnosis review and long-term planning.  [name] does not have a specific diagnosis and thus future needs cannot be as easily identified.  Mother refers to her as "special needs."  Would like to ensure patient has clear diagnoses established for future interventions as an adult.  Patient's last menstrual period was 11/26/2013.  ROS per HPI  The following portions of the patient's history were reviewed and updated as appropriate: allergies, current medications and problem list.  No Known Allergies  Physical Exam:  Filed Vitals:   12/16/13 1652  BP: 124/86  Weight: 257 lb (116.574 kg)   BP 124/86  Wt 257 lb (116.574 kg)  LMP 11/26/2013 Body mass index: body mass index is 46.23 kg/(m^2). Facility age limit for growth percentiles is 20 years.  Wt Readings from Last 3 Encounters:  12/16/13 257 lb (116.574 kg)  11/22/13 252 lb 6.4 oz (114.488 kg)  11/11/13 253 lb (114.76 kg)   Physical Exam  Constitutional: No distress.  Neck: No thyromegaly present.  Cardiovascular: Normal rate and regular rhythm.   No murmur heard. Pulmonary/Chest: Breath sounds normal.  Abdominal: Soft. There is no tenderness.  Lymphadenopathy:    She has no cervical adenopathy.    Assessment/Plan: 1. PCOS (polycystic ovarian syndrome) 2. Morbid obesity 3. Pre-diabetes Continue lifestyle changes.  Reviewed labs and significance of pre-diabetes - metFORMIN (GLUCOPHAGE XR) 500 MG 24 hr tablet; Take 3 tablets (1,500 mg total) by mouth daily with breakfast.  Dispense: 90 tablet; Refill: 1  PCOS Labs & Referrals:   - CMP annually:  Due next visit because of elevated liver enzymes - CBC annually if normal, as needed if abnormal:  Due next visit - Vit D once and then as needed if abnormal:  Due next visit as was not drawn by [facility] the first time it was ordered - Lipid annually if abnormal, every 2 years if normal:  Due 11/2015 - Hgba1c every 3 months if abnormal, every year if  normal:  Due next visit - Nutrition referral: completed and patient declined further referral  4. Anaphylaxis due to food, subsequent encounter Identified clams and shrimp as specific allergy, has epi-pen  Follow-up:  3 months after visit with Dr. Quentin Snyder, will review Dr. Fara Snyder findings and will recheck labs  Medical decision-making:  > 25 minutes spent, more than 50% of appointment was spent discussing diagnosis and management of symptoms

## 2014-01-21 ENCOUNTER — Other Ambulatory Visit: Payer: Self-pay

## 2014-02-04 ENCOUNTER — Encounter: Payer: Self-pay | Admitting: Licensed Clinical Social Worker

## 2014-03-07 ENCOUNTER — Other Ambulatory Visit: Payer: Self-pay | Admitting: Pediatrics

## 2014-03-17 ENCOUNTER — Encounter: Payer: Self-pay | Admitting: Developmental - Behavioral Pediatrics

## 2014-03-17 ENCOUNTER — Ambulatory Visit (INDEPENDENT_AMBULATORY_CARE_PROVIDER_SITE_OTHER): Payer: Self-pay | Admitting: Developmental - Behavioral Pediatrics

## 2014-03-17 VITALS — Ht 61.0 in | Wt 255.8 lb

## 2014-03-17 DIAGNOSIS — H5010 Unspecified exotropia: Secondary | ICD-10-CM

## 2014-03-17 DIAGNOSIS — IMO0001 Reserved for inherently not codable concepts without codable children: Secondary | ICD-10-CM

## 2014-03-17 DIAGNOSIS — F79 Unspecified intellectual disabilities: Secondary | ICD-10-CM

## 2014-03-17 DIAGNOSIS — F72 Severe intellectual disabilities: Secondary | ICD-10-CM

## 2014-03-17 DIAGNOSIS — E663 Overweight: Secondary | ICD-10-CM

## 2014-03-17 NOTE — Patient Instructions (Addendum)
Please ask [employer] for a copy of the most recent psychoeducational evaluation and language testing  Ask teacher about cussing- how to decrease behavior  Call dentist to ask about dental procedure  Ask teacher to complete vanderbilt rating scale and fax back to Dr. Quentin Cornwall  Will talk to Dr. Henrene Pastor about reflux

## 2014-03-17 NOTE — Progress Notes (Addendum)
Amanda Snyder was referred by Dr. Tamala Snyder for evaluation of Intellectual Disability  She likes to be called Amanda Snyder. She came to the appointment with her mother.  Primary language at home is English  The primary problem is intellectual Disability/ History of seizures in newborn Notes on problem:  Amanda Snyder has been in a self contained classroom for school since preK.  She does well in the classroom and at home according to her mother.  Her mother is concerned because she cusses when she is upset and cannot get her way.  She does not do this at school.  Occasionally she will have an outburst and become stubborn when told to do something.  She has a Teacher, English as a foreign language that stays with her daily after school until 9pm.  Mon-Fri. And  6 hours Saturday and Sunday.  Her mother wonders about the cause of her intellectual disability.  She stated that Amanda Snyder has not been diagnosed with Autism.  She has been a good sleeper until this past year when she wakes at 4am very agitated.  Her mother wonders if she is in pain.  She also has a habit of pushing her finger in the out [place] part of right eye and squint.  She has not had eye exam.  She has not had seizures since 69 months of age.  She has started recently using more words in daily living to communicate her needs and occasionally answers or responds in complete sentences.  She does use the toilet, but she is unable to dress herself or do other self care.    Rating scales Have not been completed  Medications and therapies She is on metformin Therapies tried include none  Academics She is in school self contained class IEP in place? yes  Family history -no info on dad's family Family mental illness: none known Family school failure:  None know  History Now living with mom, 42yo  patient This living situation has not changed Main caregiver is mother and is not employed. Mother is on disability Main caregiver's health status - stroke in 2011 and now has  HTN  Early history Mother's age at pregnancy was 57 years old. Father's age at time of mother's pregnancy was 47 years old. Exposures:  Mom had pneumonia and had xrays and erythromycin Prenatal care:  Yes, had multiple u/s preterm labor at 20 months had cervix closed and took demerol for pain Gestational age at birth: 81 weeks Delivery: vaginal, no problems Home from hospital with mother?   yes 59 eating pattern was poor, she did not maintain weight.  She was having seizures constantly but her mom did not know until 34 month old Early language development was delayed Motor development was delayed Early interventions and services - yes Hospitalized? No, seizures realized at 19 month old and she continued to have seizures after initial treatment Surgery(ies)? no Seizures? Birth to 6 month.  Continued taking phenobarbital until 18yo. - EEG negative Saw Dr. Gaynell Snyder later who discontinued the phenobarbital Head injury? no Loss of consciousness? no  Sleep  Bedtime is usually at 10pm    She falls asleep quickly and wakes at 4am many nights She is using nothing  to help sleep. OSA is not a concern. Caffeine intake: no  Nightmares? yes Night terrors? no Sleepwalking? No  Eating Eating sufficient protein? yes Pica? no Current BMI percentile: greater than 99th Is caregiver content with current weight? no  Toileting Toilet trained? no Constipation? Occasionally--uses apple sauce Enuresis? no Nocturnal Any UTIs?  no Any concerns about abuse? no  Discipline Method of discipline: stern voice Is discipline consistent? yes  Mood What is general mood? good Happy? yes Sad? no Irritable? no  Self-injury Self-injury? no  Anxiety  Anxiety or fears? no  Other history During the day, the child is at school  And then is with [employer] worker Last PE: 05-28-13 Hearing screen was passed at audiology in the past Vision screen was not done Cardiac evaluation: no Headaches: not  sure Stomach aches: not sure Tic(s): no  Review of systems Constitutional  Denies:  fever, abnormal weight change Eyes concerns about vision  Denies: HENT, snoring, withdrawing when someone brushes back teeth on left side--will be scheduled for sedated dental exam  Denies: concerns about hearing Cardiovascular  Denies:   irregular heart beats, rapid heart rate, syncope Gastrointestinal--possible GI reflux ? abdominal pain  Denies: , loss of appetite, constipation Genitourinary  Occasional bedwetting  Denies:   Integument  Denies:  changes in existing skin lesions or moles Neurologic speech difficulties  Denies:  seizures, tremors, headaches, loss of balance, staring spells Psychiatric  Denies:  poor social interaction,compulsive behaviors, obsessions Allergic-Immunologic  Denies:  seasonal allergies  Physical Examination BP   Ht 5' 1"  (1.549 m)  Wt 255 lb 12.8 oz (116.03 kg)  BMI 48.36 kg/m2  LMP  (LMP Unknown)   Constitutional  Appearance:  Overweight, happy, with occasional vocal outbursts and speech Head  Inspection/palpation:  normocephalic, symmetric  Stability:  cervical stability normal Ears, nose, mouth and throat  Eyes- intermittent exotropia of left eye   Ears        External ears:  auricles symmetric and normal size, external auditory canals normal appearance        Hearing:   intact both ears to conversational voice  Nose/sinuses        External nose:  symmetric appearance and normal size        Intranasal exam:   no nasal discharge Neurologic  Mental status exam        Orientation: oriented to time, place and person        Speech/language:  speech development abnormal for age, says occasional  short sentences and will use single words for self help        Attention:  attention span and concentration inappropriate        Naming/repeating:   Follows some commands    Assessment Intellectual disability - Plan: Ambulatory referral to Genetics, Amb  referral to Pediatric Ophthalmology  Overweight, pediatric, BMI (body mass index) > 99% for age  Exotropia of left eye  Plan Instructions  -  Call the clinic at [phone] with any further questions or concerns. -  Follow up with Dr. Quentin Cornwall in 3 weeks. -  Help your child to exercise more every day and to eat healthy snacks between meals. -  Ensure parental well-being with therapy, self-care, and medication as needed for HTN. -  Communicate regularly with teachers to monitor school progress. -  Reviewed old records and/or current chart. -  >50% of visit spent on counseling/coordination of care: 70 minutes out of total 80 minutes -  Please ask [facility] for a copy of the most recent psychoeducational evaluation and language testing  For Dr. Quentin Cornwall to review.  Consent sent with pt's mother. -  Ask teacher about cussing-suggestions on how to decrease behavior -  Call dentist to ask about dental procedure -  Ask teacher and parent to complete vanderbilt rating scale and fax  back to Dr. Quentin Cornwall -  Will talk to Dr. Henrene Pastor about possible GI reflux -  Referral to Genetics--mother has many questions about causation of ID -  Referral to ophthalmology -check vision and exotropia -  IEP meeting this month to discuss planning for end of [place] 2016 -  Discuss birth control further with Dr. Henrene Pastor since [facility] has many PMS symptoms that make her irritable   Psychoeducational Evaluation results [facility] 01-2003  Stanford Binet Intelligence Scale  Verbal Reasoning:  36   Abstract/visual Reasoning:  36  Quantitative Reasoning:  40   Test composite:  74 [place] concept:  Less than 2 yr 28 month age 4  (age at testing 48 yr 97 month) [place] composite SW at school:  [place] [address] [place], [place] [address]  [phone]  Office [phone]   Fax  [email or web address]

## 2014-03-24 ENCOUNTER — Encounter: Payer: Self-pay | Admitting: Pediatrics

## 2014-03-24 ENCOUNTER — Ambulatory Visit (INDEPENDENT_AMBULATORY_CARE_PROVIDER_SITE_OTHER): Payer: Self-pay | Admitting: Pediatrics

## 2014-03-24 VITALS — BP 122/80 | Wt 253.6 lb

## 2014-03-24 DIAGNOSIS — R7303 Prediabetes: Secondary | ICD-10-CM

## 2014-03-24 DIAGNOSIS — R7309 Other abnormal glucose: Secondary | ICD-10-CM

## 2014-03-24 DIAGNOSIS — F72 Severe intellectual disabilities: Secondary | ICD-10-CM

## 2014-03-24 DIAGNOSIS — H5010 Unspecified exotropia: Secondary | ICD-10-CM

## 2014-03-24 DIAGNOSIS — F514 Sleep terrors [night terrors]: Secondary | ICD-10-CM

## 2014-03-24 DIAGNOSIS — E282 Polycystic ovarian syndrome: Secondary | ICD-10-CM

## 2014-03-24 MED ORDER — OMEPRAZOLE 40 MG PO CPDR
40.0000 mg | DELAYED_RELEASE_CAPSULE | Freq: Every day | ORAL | Status: DC
Start: 2014-03-24 — End: 2014-08-03

## 2014-03-24 MED ORDER — METFORMIN HCL ER 500 MG PO TB24: ORAL_TABLET | ORAL | Status: DC

## 2014-03-24 NOTE — Progress Notes (Signed)
Pre-Visit Planning  Previous Psych Screenings:  NA Psych Screenings Due: NA  Review of previous notes:  Last seen in [facility] on 12/16/13.  Treatment plan at last visit included continue metformin 1500 mg, work on lifestyle changes, allergy referral.  Pt was seen by Dr. Quentin Cornwall 03/17/14 and the following recommendations were made:  - Follow up with Dr. Quentin Cornwall in 3 weeks. - Help your child to exercise more every day and to eat healthy snacks between meals. - Ensure parental well-being with therapy, self-care, and medication as needed for HTN. - Communicate regularly with teachers to monitor school progress. - Please ask [facility] for a copy of the most recent psychoeducational evaluation and language testing For Dr. Quentin Cornwall to review. Consent sent with pt's mother. - Ask teacher about cussing-suggestions on how to decrease behavior - Call dentist to ask about dental procedure - Ask teacher and parent to complete vanderbilt rating scale and fax back to Dr. Quentin Cornwall - Will talk to Dr. Henrene Pastor about possible GI reflux - Referral to Genetics--mother has many questions about causation of ID - Referral to ophthalmology -check vision and exotropia - IEP meeting this month to discuss planning for end of [place] 2016 - Discuss birth control further with Dr. Henrene Pastor since Chauncey Fischer has many PMS symptoms that make her irritable   Last CPE: 05/28/13  Last STI screen: Due Pertinent Labs: None  Immunizations Due: ?records incomplete  To Do at visit:   - Urine GC/CT - Review recommendations from Dr. Quentin Cornwall - Discuss possible birth control to control PMS - Discuss possible reflux treatment   PCOS Labs & Referrals:  - CMP annually: Due - CBC annually if normal, as needed if abnormal: Due - Vit D once and then as needed if abnormal: Due - Lipid annually if abnormal, every 2 years if normal: Due 11/2015 - Hgba1c every 3 months if abnormal, every year if normal: Due -  Nutrition referral: completed and patient declined further referral

## 2014-03-24 NOTE — Progress Notes (Signed)
Pre-Visit Planning  Previous Psych Screenings:  NA Psych Screenings Due: NA  Review of previous notes:  Last seen in [facility] on 12/16/13.  Treatment plan at last visit included continue metformin 1500 mg, work on lifestyle changes, allergy referral.  Pt was seen by Dr. Quentin Cornwall 03/17/14 and the following recommendations were made:  - Follow up with Dr. Quentin Cornwall in 3 weeks. - Help your child to exercise more every day and to eat healthy snacks between meals. - Ensure parental well-being with therapy, self-care, and medication as needed for HTN. - Communicate regularly with teachers to monitor school progress. - Please ask [facility] for a copy of the most recent psychoeducational evaluation and language testing For Dr. Quentin Cornwall to review. Consent sent with pt's mother. - Ask teacher about cussing-suggestions on how to decrease behavior - Call dentist to ask about dental procedure - Ask teacher and parent to complete vanderbilt rating scale and fax back to Dr. Quentin Cornwall - Will talk to Dr. Henrene Pastor about possible GI reflux - Referral to Genetics--mother has many questions about causation of ID - Referral to ophthalmology -check vision and exotropia - IEP meeting this month to discuss planning for end of [place] 2016 - Discuss birth control further with Dr. Henrene Pastor since Chauncey Fischer has many PMS symptoms that make her irritable   Last CPE: 05/28/13  Last STI screen: Due Pertinent Labs: None  Immunizations Due: ?records incomplete  To Do at visit:   - Urine GC/CT - Review recommendations from Dr. Quentin Cornwall - Discuss possible birth control to control PMS - Discuss possible reflux treatment   PCOS Labs & Referrals:  - CMP annually: Due - CBC annually if normal, as needed if abnormal: Due - Vit D once and then as needed if abnormal: Due - Lipid annually if abnormal, every 2 years if normal: Due 11/2015 - Hgba1c every 3 months if abnormal, every year if normal: Due -  Nutrition referral: completed and patient declined further referral  Adolescent Medicine Consultation Follow-Up Visit Amanda Snyder  is a 18 y.o. female  here today for follow-up of ID and PCOS.   PCP Confirmed?  Need to transition to adult care  No primary care provider on file.   History was provided by the mother and patient has limited verbal skills.  Growth Chart Viewed? not applicable  HPI:  Mother reports they went to ophthalmology and the patient does need glasses, they are working on getting those for her.  Mother provided testing and IEP to Dr. Quentin Cornwall who will review.    Mom declines birth control  Gets up early morning and is cussing around 4 am.  Mom is wondering if she is in pain. Present about 1 year.  Mom with reflux.  Careful about what might irritate her stomach.    Wt Readings from Last 3 Encounters:  03/24/14 253 lb 9.6 oz (115.032 kg)  03/17/14 255 lb 12.8 oz (116.03 kg)  12/16/13 257 lb (116.574 kg)    Patient's last menstrual period was 03/02/2014.  The following portions of the patient's history were reviewed and updated as appropriate: allergies, current medications and problem list.  Allergies  Allergen Reactions  . Clams [Shellfish Allergy]     And shrimp   Physical Exam:  Filed Vitals:   03/24/14 1647  BP: 122/80  Weight: 253 lb 9.6 oz (115.032 kg)   BP 122/80 mmHg  Wt 253 lb 9.6 oz (115.032 kg)  LMP 03/02/2014 Body mass index: body mass index is 47.94  kg/(m^2). Facility age limit for growth percentiles is 20 years.  Physical Exam Pt was alert, somewhat agitated.  No exam necessary today  Assessment/Plan: 1. PCOS (polycystic ovarian syndrome) 2. Morbid obesity 3. Pre-diabetes - metFORMIN (GLUCOPHAGE-XR) 500 MG 24 hr tablet; TAKE 3 TABLETS (1,500 MG TOTAL) BY MOUTH DAILY WITH BREAKFAST.  Dispense: 90 tablet; Refill: 1 - CBC with Differential - Comprehensive metabolic panel - Vit D  25 hydroxy (rtn osteoporosis monitoring) - Hemoglobin  A1c  4. Exotropia of left eye F/u with ophthalmology for glasses  5. Severe intellectual disabilities F/u with Dr. Quentin Cornwall for further recommendations for future plans.  6. Night terror Discussed may be nightmares due to variety of issues such as anxiety but will consider possible reflux and treat to see if that helps decrease these difficult night wakings. - omeprazole (PRILOSEC) 40 MG capsule; Take 1 capsule (40 mg total) by mouth daily.  Dispense: 30 capsule; Refill: 2   Follow-up:  TBD based on lab results  Medical decision-making:  > 15 minutes spent, more than 50% of appointment was spent discussing diagnosis and management of symptoms

## 2014-03-29 ENCOUNTER — Telehealth: Payer: Self-pay | Admitting: *Deleted

## 2014-03-29 NOTE — Telephone Encounter (Addendum)
[facility] Vanderbilt Assessment Scale, Teacher Informant Completed by: Deidre Ala McEachern/ 12th Grade/Self-contained/ 8:05-2:25/ No MEDS/ Date Completed: [id number]  Results Total number of questions score 2 or 3 in questions #1-9 (Inattention):  9 Total number of questions score 2 or 3 in questions #10-18 (Hyperactive/Impulsive): 0 Total Symptom Score for questions #1-18: 9 Total number of questions scored 2 or 3 in questions #19-28 (Oppositional/Conduct):   1 Total number of questions scored 2 or 3 in questions #29-31 (Anxiety Symptoms):  0 Total number of questions scored 2 or 3 in questions #32-35 (Depressive Symptoms): 0  Academics (1 is excellent, 2 is above average, 3 is average, 4 is somewhat of a problem, 5 is problematic) Reading: 5 Mathematics:  5 Written Expression: 5  Classroom Behavioral Performance (1 is excellent, 2 is above average, 3 is average, 4 is somewhat of a problem, 5 is problematic) Relationship with peers:  3 Following directions:  5 Disrupting class:  3 Assignment completion:  5 Organizational skills:  5  **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

## 2014-04-11 ENCOUNTER — Ambulatory Visit: Payer: Medicaid Other | Admitting: Developmental - Behavioral Pediatrics

## 2014-04-13 ENCOUNTER — Telehealth: Payer: Self-pay | Admitting: Pediatrics

## 2014-04-13 DIAGNOSIS — F514 Sleep terrors [night terrors]: Secondary | ICD-10-CM | POA: Insufficient documentation

## 2014-04-13 NOTE — Telephone Encounter (Signed)
Please call mother to remind her that Loray needs her labs drawn and that we are waiting for those results to determine when her next appt will be.

## 2014-04-14 ENCOUNTER — Telehealth: Payer: Self-pay

## 2014-04-14 NOTE — Telephone Encounter (Signed)
Called and spoke to mom.  She has been having car trouble and the contractors are in the home remodeling the bathroom.  She has not forgot and plans to have the labs drawn tomorrow or early next week.  I advised her that [facility] is open on Saturday mornings if that is more convenient.

## 2014-04-14 NOTE — Telephone Encounter (Signed)
done

## 2014-04-16 LAB — CBC WITH DIFFERENTIAL/PLATELET
BASOS ABS: 0 10*3/uL (ref 0.0–0.1)
BASOS PCT: 0 % (ref 0–1)
EOS ABS: 0.3 10*3/uL (ref 0.0–0.7)
Eosinophils Relative: 3 % (ref 0–5)
HEMATOCRIT: 36.7 % (ref 36.0–46.0)
Hemoglobin: 11.1 g/dL — ABNORMAL LOW (ref 12.0–15.0)
LYMPHS ABS: 3.4 10*3/uL (ref 0.7–4.0)
Lymphocytes Relative: 40 % (ref 12–46)
MCH: 21.7 pg — AB (ref 26.0–34.0)
MCHC: 30.2 g/dL (ref 30.0–36.0)
MCV: 71.7 fL — AB (ref 78.0–100.0)
MPV: 8.6 fL — AB (ref 9.4–12.4)
Monocytes Absolute: 0.6 10*3/uL (ref 0.1–1.0)
Monocytes Relative: 7 % (ref 3–12)
NEUTROS ABS: 4.2 10*3/uL (ref 1.7–7.7)
Neutrophils Relative %: 50 % (ref 43–77)
PLATELETS: 436 10*3/uL — AB (ref 150–400)
RBC: 5.12 MIL/uL — ABNORMAL HIGH (ref 3.87–5.11)
RDW: 16.5 % — ABNORMAL HIGH (ref 11.5–15.5)
WBC: 8.4 10*3/uL (ref 4.0–10.5)

## 2014-04-16 LAB — HEMOGLOBIN A1C
Hgb A1c MFr Bld: 5.6 %
Mean Plasma Glucose: 114 mg/dL

## 2014-04-16 LAB — COMPREHENSIVE METABOLIC PANEL WITH GFR
ALT: 65 U/L — ABNORMAL HIGH (ref 0–35)
AST: 40 U/L — ABNORMAL HIGH (ref 0–37)
Albumin: 4.1 g/dL (ref 3.5–5.2)
Alkaline Phosphatase: 63 U/L (ref 39–117)
BUN: 10 mg/dL (ref 6–23)
CO2: 25 meq/L (ref 19–32)
Calcium: 9.8 mg/dL (ref 8.4–10.5)
Chloride: 103 meq/L (ref 96–112)
Creat: 0.5 mg/dL (ref 0.50–1.10)
Glucose, Bld: 88 mg/dL (ref 70–99)
Potassium: 4 meq/L (ref 3.5–5.3)
Sodium: 139 meq/L (ref 135–145)
Total Bilirubin: 0.2 mg/dL (ref 0.2–1.2)
Total Protein: 7.1 g/dL (ref 6.0–8.3)

## 2014-04-16 LAB — VITAMIN D 25 HYDROXY (VIT D DEFICIENCY, FRACTURES): Vit D, 25-Hydroxy: 13 ng/mL — ABNORMAL LOW (ref 30–100)

## 2014-04-23 NOTE — Addendum Note (Signed)
Addended by: Gwynne Edinger on: 04/23/2014 09:44 PM   Modules accepted: Level of Service

## 2014-04-25 ENCOUNTER — Telehealth: Payer: Self-pay | Admitting: Licensed Clinical Social Worker

## 2014-04-25 NOTE — Telephone Encounter (Signed)
Left VM for mother to call back re: rating scale & appointment.

## 2014-04-25 NOTE — Telephone Encounter (Signed)
-----   Message from Gwynne Edinger, MD sent at 04/24/2014 12:26 PM EST ----- Please call this parent and tell her that teacher is reporting significant inattention on the rating scale that she completed on [name].  She will need to re-set her appt with me if she wants to discuss treatment.  Thanks.

## 2014-05-03 ENCOUNTER — Telehealth: Payer: Self-pay | Admitting: Pediatrics

## 2014-05-03 MED ORDER — FERROUS SULFATE 325 (65 FE) MG PO TABS: 325.0000 mg | ORAL_TABLET | Freq: Every day | ORAL | Status: DC

## 2014-05-03 MED ORDER — VITAMIN D (ERGOCALCIFEROL) 1.25 MG (50000 UNIT) PO CAPS: 50000.0000 [IU] | ORAL_CAPSULE | ORAL | Status: DC

## 2014-05-03 NOTE — Telephone Encounter (Signed)
Spoke with mother and reviewed recent labs.  Still with slightly elevated LFTs, concern for NASH. Advised to continue to focus on healthy lifestyles.  Reviewed low hgb and recommended once daily iron supps as ordered.  Reviewed low vitamin D and recommended once weekly high dose replacement as ordered.  Advised that hgba1c is better since starting metformin and again reinforced healthy lifestyle.  Plan to f/u in 2-3 months.  Advised we will call to schedule that appt.

## 2014-05-04 NOTE — Telephone Encounter (Signed)
Pt's mom called and appt scheduled for 4/19 at 3:00pm.

## 2014-05-20 ENCOUNTER — Encounter: Payer: Self-pay | Admitting: Pediatrics

## 2014-05-20 DIAGNOSIS — E559 Vitamin D deficiency, unspecified: Secondary | ICD-10-CM | POA: Insufficient documentation

## 2014-06-18 ENCOUNTER — Other Ambulatory Visit: Payer: Self-pay | Admitting: Pediatrics

## 2014-06-18 NOTE — Telephone Encounter (Signed)
Sent to me by mistake

## 2014-07-26 ENCOUNTER — Ambulatory Visit: Payer: Self-pay | Admitting: Pediatrics

## 2014-07-27 ENCOUNTER — Ambulatory Visit: Payer: Self-pay | Admitting: Pediatrics

## 2014-08-03 ENCOUNTER — Ambulatory Visit (INDEPENDENT_AMBULATORY_CARE_PROVIDER_SITE_OTHER): Payer: Medicaid Other | Admitting: Pediatrics

## 2014-08-03 ENCOUNTER — Encounter: Payer: Self-pay | Admitting: Pediatrics

## 2014-08-03 VITALS — BP 124/80 | HR 88 | Ht 62.21 in | Wt 253.0 lb

## 2014-08-03 DIAGNOSIS — E559 Vitamin D deficiency, unspecified: Secondary | ICD-10-CM

## 2014-08-03 DIAGNOSIS — R7309 Other abnormal glucose: Secondary | ICD-10-CM

## 2014-08-03 DIAGNOSIS — R7303 Prediabetes: Secondary | ICD-10-CM

## 2014-08-03 DIAGNOSIS — D509 Iron deficiency anemia, unspecified: Secondary | ICD-10-CM | POA: Insufficient documentation

## 2014-08-03 DIAGNOSIS — E282 Polycystic ovarian syndrome: Secondary | ICD-10-CM

## 2014-08-03 MED ORDER — METFORMIN HCL ER 500 MG PO TB24: ORAL_TABLET | ORAL | Status: DC

## 2014-08-03 NOTE — Progress Notes (Signed)
Adolescent Medicine Consultation Follow-Up Visit Amanda Snyder  is a 19 y.o. female referred by No ref. provider found here today for follow-up of PCOS.   PCP Confirmed?  Patient does not currently have a PCP due to Medicare coverage difficulty finding an PCP accepting new patients.  Previsit planning completed:  no  Growth Chart Viewed? not applicable   History was provided by the mother.  HPI:   Last visit discussed taking Metformin XR 1500 mg daily Getting more skin tags on her neck, mother wondering if that is related to PCOS Referral to ophthalmology - saw ophthalmologist, needs glasses but they are not covered by Medicaid - has Medicare as well do encouraged mother to look into whether there could be coverage through Medicare Night-time difficulties - not yelling as much at night.  She has been sleeping much better.  Trouble getting her up in the morning.  Component     Latest Ref Rng 04/15/2014  WBC     4.0 - 10.5 K/uL 8.4  RBC     3.87 - 5.11 MIL/uL 5.12 (H)  Hemoglobin     12.0 - 15.0 g/dL 11.1 (L)  HCT     36.0 - 46.0 % 36.7  MCV     78.0 - 100.0 fL 71.7 (L)  MCH     26.0 - 34.0 pg 21.7 (L)  MCHC     30.0 - 36.0 g/dL 30.2  RDW     11.5 - 15.5 % 16.5 (H)  Platelets     150 - 400 K/uL 436 (H)  MPV     9.4 - 12.4 fL 8.6 (L)  Neutrophils     43 - 77 % 50  NEUT#     1.7 - 7.7 K/uL 4.2  Lymphocytes     12 - 46 % 40  Lymphocyte #     0.7 - 4.0 K/uL 3.4  Monocytes Relative     3 - 12 % 7  Monocyte #     0.1 - 1.0 K/uL 0.6  Eosinophil     0 - 5 % 3  Eosinophils Absolute     0.0 - 0.7 K/uL 0.3  Basophil     0 - 1 % 0  Basophils Absolute     0.0 - 0.1 K/uL 0.0  Smear Review      Criteria for review not met  Sodium     135 - 145 mEq/L 139  Potassium     3.5 - 5.3 mEq/L 4.0  Chloride     96 - 112 mEq/L 103  CO2     19 - 32 mEq/L 25  Glucose     70 - 99 mg/dL 88  BUN     6 - 23 mg/dL 10  Creatinine     0.50 - 1.10 mg/dL 0.50  Total Bilirubin  0.2 - 1.2 mg/dL 0.2  Alkaline Phosphatase     39 - 117 U/L 63  AST     0 - 37 U/L 40 (H)  ALT     0 - 35 U/L 65 (H)  Total Protein     6.0 - 8.3 g/dL 7.1  Albumin     3.5 - 5.2 g/dL 4.1  Calcium     8.4 - 10.5 mg/dL 9.8  Hemoglobin A1C     <5.7 % 5.6  Mean Plasma Glucose     <117 mg/dL 114  Vit D, 25-Hydroxy     30 - 100 ng/mL 13 (L)  PCOS Labs & Referrals:   - Hgba1c annually if normal, every 3 months if abnormal:  Due NOW - CMP annually if normal, as needed if abnormal:  Due 04/2015 - CBC annually if normal, as needed if abnormal:  Due 04/2015 (but hgb FS sooner) - Lipid every 2 years if normal, annually if abnormal:  Due 11/2015 - Vitamin D annually if normal, as needed if abnormal: Due NOW if taking vitamin D - Nutrition referral: made previously, mother declines further referrals   Wt Readings from Last 3 Encounters:  08/03/14 253 lb (114.76 kg)  03/24/14 253 lb 9.6 oz (115.032 kg)  03/17/14 255 lb 12.8 oz (116.03 kg)    No LMP recorded.  The following portions of the patient's history were reviewed and updated as appropriate: allergies, current medications and problem list.  Allergies  Allergen Reactions  . Clams [Shellfish Allergy]     And shrimp    Physical Exam:  Filed Vitals:   08/03/14 1624  BP: 124/80  Pulse: 88  Height: 5' 2.21" (1.58 m)  Weight: 253 lb (114.76 kg)   BP 124/80 mmHg  Pulse 88  Ht 5' 2.21" (1.58 m)  Wt 253 lb (114.76 kg)  BMI 45.97 kg/m2 Body mass index: body mass index is 45.97 kg/(m^2). Facility age limit for growth percentiles is 20 years.  Physical Exam  Constitutional: No distress.  Neck: No thyromegaly present.  Cardiovascular: Normal rate and regular rhythm.   No murmur heard. Pulmonary/Chest: Breath sounds normal.  Abdominal: Soft. There is no tenderness. There is no guarding.  Musculoskeletal: She exhibits no edema.  Lymphadenopathy:    She has no cervical adenopathy.  Skin:  Multiple skin tags on neck,  acanthosis nigricans  Nursing note and vitals reviewed.  Wt Readings from Last 3 Encounters:  08/03/14 253 lb (114.76 kg)  03/24/14 253 lb 9.6 oz (115.032 kg)  03/17/14 255 lb 12.8 oz (116.03 kg)     Assessment/Plan: 1. PCOS (polycystic ovarian syndrome) 2. Morbid obesity 3. Pre-diabetes Discussed importance of intensive dietary management and increasing physical activity.  Pleased that weight is stable and praised mother for that. - Hemoglobin A1c - metFORMIN (GLUCOPHAGE-XR) 500 MG 24 hr tablet; TAKE 3 TABLETS (1,500 MG TOTAL) BY MOUTH DAILY WITH BREAKFAST.  Dispense: 90 tablet; Refill: 1  4. Vitamin D deficiency - Vit D  25 hydroxy (rtn osteoporosis monitoring)  Will work on identifying adult provider who can manage patients multiple health issues.  Follow-up:  Return in about 3 months (around 11/02/2014) for PCOS, with Dr. Henrene Pastor only.   Medical decision-making:  > 25 minutes spent, more than 50% of appointment was spent discussing diagnosis and management of symptoms

## 2014-08-04 LAB — VITAMIN D 25 HYDROXY (VIT D DEFICIENCY, FRACTURES): VIT D 25 HYDROXY: 18 ng/mL — AB (ref 30–100)

## 2014-08-04 LAB — HEMOGLOBIN A1C
HEMOGLOBIN A1C: 6 % — AB (ref <5.7)
Mean Plasma Glucose: 126 mg/dL — ABNORMAL HIGH (ref <117)

## 2014-08-17 ENCOUNTER — Encounter: Payer: Self-pay | Admitting: Pediatrics

## 2014-08-17 NOTE — Progress Notes (Signed)
Quick Note:  Letter sent with results and recommendations. ______

## 2014-10-04 ENCOUNTER — Ambulatory Visit: Payer: Medicare Other | Admitting: Pediatrics

## 2014-10-31 ENCOUNTER — Other Ambulatory Visit: Payer: Self-pay | Admitting: Pediatrics

## 2014-11-02 ENCOUNTER — Ambulatory Visit (INDEPENDENT_AMBULATORY_CARE_PROVIDER_SITE_OTHER): Payer: Medicaid Other | Admitting: Pediatrics

## 2014-11-02 ENCOUNTER — Encounter: Payer: Self-pay | Admitting: Pediatrics

## 2014-11-02 VITALS — BP 135/86 | Ht 60.83 in | Wt 250.2 lb

## 2014-11-02 DIAGNOSIS — E282 Polycystic ovarian syndrome: Secondary | ICD-10-CM

## 2014-11-02 DIAGNOSIS — R7303 Prediabetes: Secondary | ICD-10-CM

## 2014-11-02 DIAGNOSIS — R7309 Other abnormal glucose: Secondary | ICD-10-CM

## 2014-11-02 DIAGNOSIS — E559 Vitamin D deficiency, unspecified: Secondary | ICD-10-CM

## 2014-11-02 DIAGNOSIS — Z130 Encounter for screening for diseases of the blood and blood-forming organs and certain disorders involving the immune mechanism: Secondary | ICD-10-CM

## 2014-11-02 DIAGNOSIS — T782XXD Anaphylactic shock, unspecified, subsequent encounter: Secondary | ICD-10-CM

## 2014-11-02 LAB — POCT HEMOGLOBIN: Hemoglobin: 12.3 g/dL (ref 12.2–16.2)

## 2014-11-02 MED ORDER — EPINEPHRINE 0.3 MG/0.3ML IJ SOAJ: 0.3000 mg | Freq: Once | INTRAMUSCULAR | Status: DC | PRN

## 2014-11-02 MED ORDER — METFORMIN HCL ER 500 MG PO TB24: ORAL_TABLET | ORAL | Status: DC

## 2014-11-02 MED ORDER — CETIRIZINE HCL 10 MG PO TABS: 10.0000 mg | ORAL_TABLET | Freq: Every day | ORAL | Status: DC

## 2014-11-02 MED ORDER — VITAMIN D (ERGOCALCIFEROL) 1.25 MG (50000 UNIT) PO CAPS: 50000.0000 [IU] | ORAL_CAPSULE | ORAL | Status: DC

## 2014-11-02 NOTE — Progress Notes (Signed)
Pre-Visit Planning  Ricci Mull  is a 19 y.o. female referred by No primary care provider on file..   Last seen in [facility] on 08/03/2014 for PCOS, morbid obesity, pre-diabetes, vitamin D deficiency.   Previous Psych Screenings?  n/a  Treatment plan at last visit included continue metformin, vitamin D monitoring.   Clinical Staff Visit Tasks:   - Urine GC/CT due? yes, but not able to get reimbursement by Medicare - Psych Screenings Due? n/a - FS Hgb  Provider Visit Tasks: - Asess PCOS symptoms - Assess medication benefits and side effects - Review Vitamin D and prediabetes - Review other primary care provider - Pertinent Labs? Yes Component     Latest Ref Rng 08/03/2014  Hemoglobin A1C     <5.7 % 6.0 (H)  Mean Plasma Glucose     <117 mg/dL 126 (H)  Vit D, 25-Hydroxy     30 - 100 ng/mL 18 (L)    PCOS Labs & Referrals:  - Hgba1c annually if normal, every 3 months if abnormal: Due NOW - CMP annually if normal, as needed if abnormal: Due 04/2015 - CBC annually if normal, as needed if abnormal: Due 04/2015 (but hgb FS sooner) - Lipid every 2 years if normal, annually if abnormal: Due 11/2015 - Vitamin D annually if normal, as needed if abnormal: Due NOW if taking vitamin D - Nutrition referral: made previously, mother declines further referrals

## 2014-11-02 NOTE — Patient Instructions (Signed)
Call Misti [phone] to schedule an appointment for [facility] at [facility]

## 2014-11-02 NOTE — Progress Notes (Signed)
THIS RECORD MAY CONTAIN CONFIDENTIAL INFORMATION THAT SHOULD NOT BE RELEASED WITHOUT REVIEW OF THE SERVICE PROVIDER.  Adolescent Medicine Consultation Follow-Up Visit Amanda Snyder  is a 19 y.o. female referred by No ref. provider found here today for follow-up of PCOS.    Previsit planning completed:  yes Pre-Visit Planning  Amanda Snyder  is a 19 y.o. female referred by No primary care provider on file..   Last seen in [facility] on 08/03/2014 for PCOS, morbid obesity, pre-diabetes, vitamin D deficiency.   Previous Psych Screenings?  n/a  Treatment plan at last visit included continue metformin, vitamin D monitoring.   Clinical Staff Visit Tasks:   - Urine GC/CT due? yes, but not able to get reimbursement by Medicare - Psych Screenings Due? n/a - FS Hgb  Provider Visit Tasks: - Asess PCOS symptoms - Assess medication benefits and side effects - Review Vitamin D and prediabetes - Review other primary care provider - Pertinent Labs? Yes Component     Latest Ref Rng 08/03/2014  Hemoglobin A1C     <5.7 % 6.0 (H)  Mean Plasma Glucose     <117 mg/dL 126 (H)  Vit D, 25-Hydroxy     30 - 100 ng/mL 18 (L)    PCOS Labs & Referrals:  - Hgba1c annually if normal, every 3 months if abnormal: Due NOW - CMP annually if normal, as needed if abnormal: Due 04/2015 - CBC annually if normal, as needed if abnormal: Due 04/2015 (but hgb FS sooner) - Lipid every 2 years if normal, annually if abnormal: Due 11/2015 - Vitamin D annually if normal, as needed if abnormal: Due NOW if taking vitamin D - Nutrition referral: made previously, mother declines further referrals   Growth Chart Viewed? not applicable   History was provided by the mother.  PCP Confirmed?  yes  HPI:    Has become a bit more active Wanting to spend more time outside but trouble allowing her out safely, wondering about possible options Looking for adult daycare programs for next year  Patient's  last menstrual period was 11/02/2014 (exact date). Allergies  Allergen Reactions  . Clams [Shellfish Allergy]     And shrimp     The following portions of the patient's history were reviewed and updated as appropriate: allergies, current medications and problem list.  Physical Exam:  Filed Vitals:   11/02/14 1556  BP: 135/86  Height: 5' 0.83" (1.545 m)  Weight: 250 lb 4 oz (113.513 kg)   BP 135/86 mmHg  Ht 5' 0.83" (1.545 m)  Wt 250 lb 4 oz (113.513 kg)  BMI 47.55 kg/m2  LMP 11/02/2014 (Exact Date) Body mass index: body mass index is 47.55 kg/(m^2). Facility age limit for growth percentiles is 20 years.  Physical Exam   Assessment/Plan: 1. PCOS (polycystic ovarian syndrome) 2. Pre-diabetes Discussed importance of continued attention to healthy eating and exercise.  Would like to check hgba1c but not covered by insurance given I am not a Medicare credentialed provider.  Reviewed with mother importance of transitioning to a provider that is credentialed to see patients with Medicare. - metFORMIN (GLUCOPHAGE-XR) 500 MG 24 hr tablet; TAKE 3 TABLETS (1,500 MG TOTAL) BY MOUTH DAILY WITH BREAKFAST.  Dispense: 90 tablet; Refill: 1  3. Vitamin D deficiency - Vitamin D, Ergocalciferol, (DRISDOL) 50000 UNITS CAPS capsule; Take 1 capsule (50,000 Units total) by mouth every 7 (seven) days.  Dispense: 8 capsule; Refill: 0  4. Screening for iron deficiency anemia - POCT hemoglobin  5. Anaphylaxis, subsequent encounter - EPINEPHrine (EPIPEN 2-PAK) 0.3 mg/0.3 mL IJ SOAJ injection; Inject 0.3 mLs (0.3 mg total) into the muscle once as needed (anaphylaxis).  Dispense: 1 Device; Refill: 0   Reviewed transition to adult care   Follow-up:  Return in about 3 months (around 02/02/2015) for PCOS, with Dr. Henrene Pastor.   Medical decision-making:  > 15 minutes spent, more than 50% of appointment was spent discussing diagnosis and management of symptoms

## 2014-11-15 ENCOUNTER — Ambulatory Visit: Payer: Medicare Other | Admitting: Pediatrics

## 2014-11-22 ENCOUNTER — Ambulatory Visit (INDEPENDENT_AMBULATORY_CARE_PROVIDER_SITE_OTHER): Payer: Medicare Other | Admitting: Pediatrics

## 2014-11-22 VITALS — Ht 62.5 in | Wt 245.0 lb

## 2014-11-22 DIAGNOSIS — F72 Severe intellectual disabilities: Secondary | ICD-10-CM

## 2014-11-22 DIAGNOSIS — E282 Polycystic ovarian syndrome: Secondary | ICD-10-CM

## 2014-12-09 NOTE — Progress Notes (Signed)
Pediatric Teaching Program [place] [id number] [phone] FAX [phone]  MARGARUITE TOP DOB: 01/24/1996 Date of Evaluation: November 22, 2014  [facility] Oravec is a 19 year old female referred by Dr. Stann Mainland of [facility].  Kaylee was brought to clinic by her mother, Jeyla Bulger.  The CAP worker, Genia Hotter was also present.   Trinna was first evaluated in the [facility] clinic in January 2001 when she was 3 years 3 months of age.  At that time, Yarah was referred for consideration of a diagnosis of Angelman syndrome by pediatric neurologist, Dr. Wyline Copas.   A peripheral blood chromosome study as well as studies for the chromosome 15q12 microdeletion were normal.  The methylation study for Angelman syndrome (SNRP1 gene) was also normal.  A previous study for Fragile X syndrome was normal.  These studies were performed by the [facility] medical genetics laboratory.   Primary care has been provided in the past by [facility] and now Dr. Willaim Rayas.  Adolescent specialist, Dr. Lenore Cordia also sees [facility].   Lilias has global developmental delays that were noted early. Armina did not sit by herself until [age] year of age and walked at 1 years.  At [age] years of age, Delorice was not toilet trained. Her head circumference att the time of the genetics evaluation at nearly 19 years of age plotted at the 2nd centile.  Denym has had delays, but there is not perceived regression.    Tynlee has a history of seizures that began at one month of age and first treated with phenobarbital. An EEG at two months of age after a break-through seizure was normal.  A review of labs shows that the calcium was normal, lactate normal,  A brain MRI was normal. The sepsis work-up was negative.  Urine amino acids, urine organic acids, pyruvate and ammonia were  recommended, but the notes available suggest that the samples were incorrectly collected and not repeated.  There have not been seizures since 11 months of age.   Carreen was subsequently followed by pediatric neurologist, Dr. Wyline Copas.  Phenobarbital was stopped at 19 years of age.   Pediatric ophthalmologist, Dr. Lenox Ahr follows Stephonie. There is left exotropia. Daje has nearsightedness and astigmatism.  Dawsyn has passed audiology exams per her mother.   Growth data a 33 months of age shows weigh at the 50th centile, length at the 25th centile and head circumference at the 50th centile.  Dr. Henrene Pastor has diagnosed Serenitee with pre-diabetes, PCOS and vitamin D deficiency.  Kharisma takes Metformin. Menstrual periods started at 19 years of age. Other endocrine tests requested by Dr. Henrene Pastor were unremarkable.   There was a foot fracture in the past 3 years that required use of a walking boot.   BEHAVIOR:  There are frequent uncontrolled outbursts and cursing. Keshawn is unable to dress herself or completely provide self care. She sleeps relatively well.      BIRTH HISTORY: after the first evaluation in 2001, we requested copies of medical records from the birth hospital [facility] [place]).  This information is summarized: The infant was delivered vaginally with vertex presentation at 36 3/[redacted] weeks gestation.  The APGAR scores were 9 at one minute and 9 at five minutes. The birth weight was 4lb 9oz and length 19 inches.  The [place] state newborn metabolic screen was normal (PHE, LEU, MET,  GALT, Biotinidase, Thyroxine and Sickle cell Hemoglobin).  The prenatal course was complicated by cervical incompetence with cerclage placement at 5 months gestation an with removal of cerclage. The mother is blood type A positive. The mother was 69 years of age and had been treated for pneumonia (erythromycin)  3 months prior to delivery. The mother also had psoriasis. There was serological immunity to  [place]. The HbSAg study was negative, group B strep negative.  The maternal notes indicate that the mother did not use alcohol or cigarettes.  The mother reports good fetal movement. The placental pathology was unremarkable except for a marginal hematoma.   FAMILY HISTORY: Melena has two maternal half-sisters and a maternal half-brother. Her half-brother has a 24 year old son with speech delays.   The mother had a stroke in 2011 and now has a diagnosis of hypertension. There is a family history of breast and colon cancer.  Dedra's father is deceased and reportedly "talked late."own consanguinity.   Physical Examination: some tactile defensiveness Ht 5' 2.5" (1.588 m)  Wt 111.131 kg (245 lb)  BMI 44.07 kg/m2  HC 52.6 cm (20.71")  LMP 11/02/2014 Height 10th centile, weight 99th centile; BMI 47.61 Z=2.35)   Head/facies    Somewhat round facies with no unusual features.   Eyes Normal pupillary responses, no scleral icterus  Ears Normally formed and places.   Mouth Mild hyperpigmentation of tongue.   Neck Prominent hump on back; no thyromegaly;   Chest  no murmur  Abdomen Nondistended, no umbilical hernia  Genitourinary TANNER stage V  Musculoskeletal No contractures, slightly tapered fingers.   Neuro No tremor, no ataxia  Skin/Integument No unusual skin lesions   ASSESSMENT:  Janitza is a 19 year old female with global developmental delays and relative microcephaly.  There were seizures as an infant that have not recurred. Liyanna has obesity, relative short stature and behavioral problems.  The prenatal and newborn record from the [place] birth hospital have been extensively reviewed. There were no clues to a diagnosis from that information.   At the initial genetics evaluation at nearly 19 years of age, one diagnostic consideration was Angelman syndrome as had been also suggested by Dr. Gaynell Face.  However studies that detect up to 85% of individuals with Angelman syndrome were normal. I do not  recommend any further testing for single gene associations with Angelman syndrome given that Nina does not have striking features of that condition now.  She has more language development than expected, for example.  However, it would be important to determine if there is a subtle genomic microdeletion or microduplicaiton that would not have been detected on the karyotype.    Genetic counselor, Delon Sacramento and I have reviewed the rationale for additional genetic testing with Lashanna's mother and CAP worker. They are doing a wonderful job Air cabin crew for [employer].   RECOMMENDATIONS: Blood was collected for a whole genomic microarray to be performed by [facility] We encourage the developmental interventions for [facility] and follow-up with specialists.     York Grice, M.D., Ph.D. Clinical Professor, Pediatrics and Medical Genetics

## 2014-12-14 ENCOUNTER — Encounter: Payer: Self-pay | Admitting: Pediatrics

## 2015-01-28 ENCOUNTER — Other Ambulatory Visit: Payer: Self-pay | Admitting: Pediatrics

## 2015-01-28 NOTE — Telephone Encounter (Signed)
refill 

## 2015-01-30 ENCOUNTER — Other Ambulatory Visit: Payer: Self-pay | Admitting: Family

## 2015-01-30 DIAGNOSIS — E282 Polycystic ovarian syndrome: Secondary | ICD-10-CM

## 2015-01-30 MED ORDER — METFORMIN HCL ER 500 MG PO TB24: ORAL_TABLET | ORAL | Status: DC

## 2015-01-30 NOTE — Telephone Encounter (Signed)
I have sent in Metformin refill. Thanks - cm

## 2015-02-02 ENCOUNTER — Ambulatory Visit: Payer: Medicaid Other | Admitting: Pediatrics

## 2015-02-22 ENCOUNTER — Encounter: Payer: Self-pay | Admitting: Pediatrics

## 2015-02-22 NOTE — Progress Notes (Signed)
Pre-Visit Planning  Kla Langer  is a 19 y.o. female referred by No primary care provider on file..   Last seen in [facility] on 11/02/2014 for PCOS, prediabetes, vit D def.   Previous Psych Screenings?  No  Treatment plan at last visit included continue metformin, take high dose vitamin D.   Clinical Staff Visit Tasks:   - Urine GC/CT due? no - Psych Screenings Due? No  Provider Visit Tasks: - Assess PCOS symptoms - Discuss transition of care - Pertinent Labs? No  PCOS Labs & Referrals:  - Hgba1c annually if normal, every 3 months if abnormal: Due NOW - CMP annually if normal, as needed if abnormal: Due 04/2015 - CBC annually if normal, as needed if abnormal: Due 04/2015 (but hgb FS sooner) - Lipid every 2 years if normal, annually if abnormal: Due 11/2015 - Vitamin D annually if normal, as needed if abnormal: Due NOW if taking vitamin D - Nutrition referral: made previously, mother declines further referrals

## 2015-02-23 ENCOUNTER — Encounter: Payer: Self-pay | Admitting: Pediatrics

## 2015-02-23 ENCOUNTER — Encounter: Payer: Self-pay | Admitting: *Deleted

## 2015-02-23 ENCOUNTER — Ambulatory Visit (INDEPENDENT_AMBULATORY_CARE_PROVIDER_SITE_OTHER): Payer: Self-pay | Admitting: Pediatrics

## 2015-02-23 VITALS — BP 134/90 | HR 98 | Ht 62.0 in | Wt 251.0 lb

## 2015-02-23 DIAGNOSIS — E559 Vitamin D deficiency, unspecified: Secondary | ICD-10-CM

## 2015-02-23 DIAGNOSIS — E282 Polycystic ovarian syndrome: Secondary | ICD-10-CM

## 2015-02-23 DIAGNOSIS — R1084 Generalized abdominal pain: Secondary | ICD-10-CM

## 2015-02-23 DIAGNOSIS — R7303 Prediabetes: Secondary | ICD-10-CM

## 2015-02-23 MED ORDER — METFORMIN HCL ER 500 MG PO TB24: ORAL_TABLET | ORAL | Status: DC

## 2015-02-23 NOTE — Progress Notes (Signed)
THIS RECORD MAY CONTAIN CONFIDENTIAL INFORMATION THAT SHOULD NOT BE RELEASED WITHOUT REVIEW OF THE SERVICE PROVIDER.  Adolescent Medicine Consultation Follow-Up Visit Amanda Snyder  is a 19 y.o. female referred by No ref. provider found here today for follow-up.    My Chart Activated?   yes   Previsit planning completed:  yes Pre-Visit Planning  Amanda Snyder  is a 19 y.o. female referred by No primary care provider on file..   Last seen in [facility] on 11/02/2014 for PCOS, prediabetes, vit D def.   Previous Psych Screenings?  No  Treatment plan at last visit included continue metformin, take high dose vitamin D.   Clinical Staff Visit Tasks:   - Urine GC/CT due? no - Psych Screenings Due? No  Provider Visit Tasks: - Assess PCOS symptoms - Discuss transition of care - Pertinent Labs? No  PCOS Labs & Referrals:  - Hgba1c annually if normal, every 3 months if abnormal: Due NOW - CMP annually if normal, as needed if abnormal: Due 04/2015 - CBC annually if normal, as needed if abnormal: Due 04/2015 (but hgb FS sooner) - Lipid every 2 years if normal, annually if abnormal: Due 11/2015 - Vitamin D annually if normal, as needed if abnormal: Due NOW if taking vitamin D - Nutrition referral: made previously, mother declines further referrals   Growth Chart Viewed? not applicable   History was provided by the patient, mother and sister.  PCP Confirmed?  Needs adult PCP  HPI:   Mom reports no concerns today.  She is aware that Amanda Snyder's weight has increased since last visit.  She is concerned about that but has not been able to find easy ways for her to increase her activity.  She has declined nutrition referral.  Food is often a reward for Amanda Snyder.  She likes sweets and junk food.  Discussed her prediabetes and borderline hypertension.  Discussed importance of transitioning to adult provider.  Mom has H. Pylori.  Mom is s/p gallbladder surgery.  Has high  cholesterol and multiple stones. Concerned patient is having stomach issues and wondering if patient has h pylori as well.  Patient's last menstrual period was 02/05/2015 (approximate). Allergies  Allergen Reactions  . Clams [Shellfish Allergy]     And shrimp   Current Outpatient Prescriptions on File Prior to Visit  Medication Sig Dispense Refill  . cetirizine (ZYRTEC) 10 MG tablet Take 1 tablet (10 mg total) by mouth daily. 30 tablet 11  . EPINEPHrine (EPIPEN 2-PAK) 0.3 mg/0.3 mL IJ SOAJ injection Inject 0.3 mLs (0.3 mg total) into the muscle once as needed (anaphylaxis). 1 Device 0   No current facility-administered medications on file prior to visit.    Social History: Patient's sister is present today at the visit.  Both sisters have been assisting with their sister's care while mother was in hospital for cholecystectomy  The following portions of the patient's history were reviewed and updated as appropriate: allergies, current medications and problem list.  Physical Exam:  Filed Vitals:   02/23/15 1456  BP: 134/90  Pulse: 98  Height: 5' 2"  (1.575 m)  Weight: 251 lb (113.853 kg)   BP 134/90 mmHg  Pulse 98  Ht 5' 2"  (1.575 m)  Wt 251 lb (113.853 kg)  BMI 45.90 kg/m2  LMP 02/05/2015 (Approximate) Body mass index: body mass index is 45.9 kg/(m^2). Facility age limit for growth percentiles is 20 years.  Physical Exam  Constitutional: No distress.  HENT:  Right Ear: External ear normal.  Left Ear: External ear normal.  Mouth/Throat: No oropharyngeal exudate.  Neck: No thyromegaly present.  Cardiovascular: Normal rate and regular rhythm.   No murmur heard. Pulmonary/Chest: Breath sounds normal.  Abdominal: Soft. There is no tenderness. There is no guarding.  Musculoskeletal: She exhibits no edema.  Lymphadenopathy:    She has no cervical adenopathy.  Neurological: She is alert.  Nursing note and vitals reviewed.    Assessment/Plan: 1. PCOS (polycystic ovarian  syndrome) Pt needs more intensive dietary and activity intervention but did not push the issues extensively today given mother is recovering from surgery and was still very uncomfortable.  Advised of importance of continuing metformin. - metFORMIN (GLUCOPHAGE-XR) 500 MG 24 hr tablet; TAKE 3 TABLETS (1,500 MG TOTAL) BY MOUTH DAILY WITH BREAKFAST.  Dispense: 90 tablet; Refill: 2 - Hemoglobin A1c - Comprehensive metabolic panel  2. Vitamin D deficiency Will recheck level now that mother reports she has taken consistently. - VITAMIN D 25 Hydroxy (Vit-D Deficiency, Fractures)  3. Generalized abdominal pain Mother notes patient intermittently complains of abdominal pain, sometimes during the night.  Previously tried PPI with unclear response.   - Helicobacter pylori abs-IgG+IgA, bld  4. Pre-diabetes - Continue metformin.  More intensive attention needed to diet and exercise.   Follow-up:  Return in about 3 months (around 05/26/2015).   DIscussed care transition.  No need to f/u if PCP identified before next due visit.  Medical decision-making:  > 25 minutes spent, more than 50% of appointment was spent discussing diagnosis and management of symptoms

## 2015-03-09 ENCOUNTER — Telehealth: Payer: Self-pay | Admitting: *Deleted

## 2015-03-09 NOTE — Telephone Encounter (Signed)
TC from [facility]. Clarified with lab tech that ordered lab H pylori may be taken by a breath test, as they are no longer offering the blood draw. Encouraged cooperation from pt, requested callback if unsuccessful.

## 2015-03-10 ENCOUNTER — Telehealth: Payer: Self-pay | Admitting: *Deleted

## 2015-03-10 NOTE — Telephone Encounter (Signed)
-----   Message from Gaspar Skeeters, MD sent at 03/10/2015  3:10 PM EST ----- Please call mother to remind her that patient needs blood drawn.  They had to leave quickly last visit as mom was still recovering from her gallbladder surgery. Thanks  ----- Message -----    From: SYSTEM    Sent: 02/28/2015  12:05 AM      To: Gaspar Skeeters, MD

## 2015-03-10 NOTE — Telephone Encounter (Signed)
TC to mom. Mom confirmed that pt is having labs done now, as she was unable to have them done yesterday, d/t eating prior to breath test.

## 2015-03-11 LAB — COMPREHENSIVE METABOLIC PANEL
ALK PHOS: 64 U/L (ref 33–115)
ALT: 43 U/L — AB (ref 6–29)
AST: 24 U/L (ref 10–30)
Albumin: 3.7 g/dL (ref 3.6–5.1)
BUN: 12 mg/dL (ref 7–25)
CALCIUM: 9.2 mg/dL (ref 8.6–10.2)
CHLORIDE: 102 mmol/L (ref 98–110)
CO2: 29 mmol/L (ref 20–31)
Creat: 0.58 mg/dL (ref 0.50–1.10)
Glucose, Bld: 81 mg/dL (ref 65–99)
POTASSIUM: 4.4 mmol/L (ref 3.5–5.3)
Sodium: 139 mmol/L (ref 135–146)
TOTAL PROTEIN: 7.1 g/dL (ref 6.1–8.1)
Total Bilirubin: 0.2 mg/dL (ref 0.2–1.2)

## 2015-03-11 LAB — HEMOGLOBIN A1C
Hgb A1c MFr Bld: 5.8 % — ABNORMAL HIGH (ref <5.7)
Mean Plasma Glucose: 120 mg/dL — ABNORMAL HIGH (ref <117)

## 2015-03-11 LAB — VITAMIN D 25 HYDROXY (VIT D DEFICIENCY, FRACTURES): Vit D, 25-Hydroxy: 16 ng/mL — ABNORMAL LOW (ref 30–100)

## 2015-03-13 LAB — H. PYLORI BREATH TEST

## 2015-03-22 ENCOUNTER — Other Ambulatory Visit: Payer: Self-pay | Admitting: Pediatrics

## 2015-03-22 DIAGNOSIS — E559 Vitamin D deficiency, unspecified: Secondary | ICD-10-CM

## 2015-03-22 MED ORDER — VITAMIN D (ERGOCALCIFEROL) 1.25 MG (50000 UNIT) PO CAPS: 50000.0000 [IU] | ORAL_CAPSULE | ORAL | Status: DC

## 2015-03-22 NOTE — Progress Notes (Signed)
Quick Note:  Reviewed results with mother. Advised will call in prescription for Vitamin D. Advised continue metformin. Will continue to monitor hgba1c. We are also continuing to look for an adult primary care provider for the patient. Pt to f/u in 3 months as planned unless a new PCP is identified. ______

## 2015-05-25 ENCOUNTER — Telehealth: Payer: Self-pay | Admitting: *Deleted

## 2015-05-25 ENCOUNTER — Encounter: Payer: Self-pay | Admitting: Pediatrics

## 2015-05-25 NOTE — Telephone Encounter (Signed)
Letter written

## 2015-05-25 NOTE — Telephone Encounter (Signed)
Vm from pt's mom. States that they are going through cap review, as she needs day support. Mom is requesting a letter from Dr. Henrene Pastor explaining pt's hx, medical issues as to why pt needs day support. Mom states they need letter asap d/t upcoming review and budgeting. [phone].

## 2015-05-26 NOTE — Telephone Encounter (Signed)
Vm from mom requesting refill be sent to worker - to [facility] [phone]. Letter to be faxed.

## 2015-05-26 NOTE — Telephone Encounter (Signed)
Letter addressed, placed in outgoing mail.  LVM with mom that we will mail letter to pt. Provided call back phone number.

## 2015-05-31 ENCOUNTER — Ambulatory Visit: Payer: Medicare Other | Admitting: Pediatrics

## 2015-07-02 ENCOUNTER — Other Ambulatory Visit: Payer: Self-pay | Admitting: Pediatrics

## 2015-07-05 ENCOUNTER — Encounter: Payer: Self-pay | Admitting: Pediatrics

## 2015-07-05 ENCOUNTER — Ambulatory Visit (INDEPENDENT_AMBULATORY_CARE_PROVIDER_SITE_OTHER): Payer: Medicare Other | Admitting: Pediatrics

## 2015-07-05 VITALS — BP 139/91 | HR 123 | Ht 61.0 in | Wt 253.0 lb

## 2015-07-05 DIAGNOSIS — E559 Vitamin D deficiency, unspecified: Secondary | ICD-10-CM | POA: Diagnosis not present

## 2015-07-05 DIAGNOSIS — D509 Iron deficiency anemia, unspecified: Secondary | ICD-10-CM

## 2015-07-05 DIAGNOSIS — Z130 Encounter for screening for diseases of the blood and blood-forming organs and certain disorders involving the immune mechanism: Secondary | ICD-10-CM | POA: Diagnosis not present

## 2015-07-05 DIAGNOSIS — R030 Elevated blood-pressure reading, without diagnosis of hypertension: Secondary | ICD-10-CM | POA: Diagnosis not present

## 2015-07-05 DIAGNOSIS — R7303 Prediabetes: Secondary | ICD-10-CM | POA: Diagnosis not present

## 2015-07-05 DIAGNOSIS — F72 Severe intellectual disabilities: Secondary | ICD-10-CM | POA: Diagnosis not present

## 2015-07-05 DIAGNOSIS — E282 Polycystic ovarian syndrome: Secondary | ICD-10-CM | POA: Diagnosis not present

## 2015-07-05 NOTE — Progress Notes (Signed)
THIS RECORD MAY CONTAIN CONFIDENTIAL INFORMATION THAT SHOULD NOT BE RELEASED WITHOUT REVIEW OF THE SERVICE PROVIDER.  Adolescent Medicine Consultation Follow-Up Visit Amanda Snyder  is a 20 y.o. female referred by No ref. provider found here today for follow-up.    Previsit planning completed:  yes Pre-Visit Planning  Amanda Snyder  is a 20 y.o. female referred by No primary care provider on file..   Last seen in [facility] on 02/2015 for PCOS, vit D deficiency, adbn pain, prediabetes.   Previous Psych Screenings? NA  Treatment plan at last visit included discussion of more careful dietary intervention and exercise.  Continue metformin.  Vitamin D level rechecked.  Mother was concerned about possible h pylori because mother had h pylori.  Pt was unable to complete h pylori breath test.   Clinical Staff Visit Tasks:   - Urine GC/CT due? yes,  But unable to order due to insurance - Psych Screenings Due? NA - FS Hgb and HgbA1cC  Provider Visit Tasks: - Review PCOS symptoms - Refill any medications needed - Advise this is the last visit at [facility].  Advised of lab testing indicated but cannot perform at this time due to billing issues as patient has medicare.  - Advise to discuss h pylori testing with next MD - Assist with scheduling appt with PCP for patient - Advised that visit with new PCP can include other labs due included CBC and vitamin D - [facility] Involvement? Yes - Pertinent Labs? Yes,  Component     Latest Ref Rng 03/10/2015  Sodium     135 - 146 mmol/L 139  Potassium     3.5 - 5.3 mmol/L 4.4  Chloride     98 - 110 mmol/L 102  CO2     20 - 31 mmol/L 29  Glucose     65 - 99 mg/dL 81  BUN     7 - 25 mg/dL 12  Creatinine     0.50 - 1.10 mg/dL 0.58  Total Bilirubin     0.2 - 1.2 mg/dL 0.2  Alkaline Phosphatase     33 - 115 U/L 64  AST     10 - 30 U/L 24  ALT     6 - 29 U/L 43 (H)  Total Protein     6.1 - 8.1 g/dL 7.1  Albumin     3.6 - 5.1 g/dL  3.7  Calcium     8.6 - 10.2 mg/dL 9.2  Hemoglobin A1C     <5.7 % 5.8 (H)  Mean Plasma Glucose     <117 mg/dL 120 (H)  Vitamin D, 25-Hydroxy     30 - 100 ng/mL 16 (L)    PCOS Labs & Referrals:  - Hgba1c annually if normal, every 3 months if abnormal: Due NOW - CMP annually if normal, as needed if abnormal: Due 03/2016 - CBC annually if normal, as needed if abnormal: Due NOW - Lipid every 2 years if normal, annually if abnormal: Due 11/2015 - Vitamin D annually if normal, as needed if abnormal: Due NOW if taking vitamin D - Nutrition referral: made previously, mother declines further referrals   >8 minutes spent reviewing records and planning for patient's visit. Growth Chart Viewed? not applicable   History was provided by the mother.  PCP Confirmed?  no  My Chart Activated?   yes   HPI:    Periods are more regular.  Minimal acne or hair growth.  Watching what she eats and  having good results with that in that [facility] eats only what she is given.  Working on increasing her activity still.  One concern today:   Changed her reaction to wiping her buttocks and private area Swearing at mother for wiping her, wants to ensure nothing has happened to her Does attend school and so has some times when she is not as intensely supervised Discussed differential of possible rectal or vaginal pain or irritation.  Mother to monitor her stooling patterns more closely, avoid any irritants in vaginal area.  Discussed obtaining genetics results from [facility]  No LMP recorded. Allergies  Allergen Reactions  . Clams [Shellfish Allergy]     And shrimp   Outpatient Encounter Prescriptions as of 07/05/2015  Medication Sig  . Biotin 2500 MCG CAPS Take by mouth.  . cetirizine (ZYRTEC) 10 MG tablet Take 1 tablet (10 mg total) by mouth daily.  Marland Kitchen EPINEPHrine (EPIPEN 2-PAK) 0.3 mg/0.3 mL IJ SOAJ injection Inject 0.3 mLs (0.3 mg total) into the muscle once as needed (anaphylaxis).  .  metFORMIN (GLUCOPHAGE-XR) 500 MG 24 hr tablet TAKE 3 TABLETS (1,500 MG TOTAL) BY MOUTH DAILY WITH BREAKFAST.  Marland Kitchen Vitamin D, Ergocalciferol, (DRISDOL) 50000 UNITS CAPS capsule Take 1 capsule (50,000 Units total) by mouth every 7 (seven) days.   No facility-administered encounter medications on file as of 07/05/2015.     Patient Active Problem List   Diagnosis Date Noted  . Iron deficiency anemia 08/03/2014  . Vitamin D deficiency 05/20/2014  . Exotropia of left eye 03/17/2014  . Anaphylaxis due to food 11/11/2013  . Morbid obesity ([place]) 11/11/2013  . PCOS (polycystic ovarian syndrome) 11/11/2013  . Patient requests no residents 11/11/2013  . Elevated blood pressure reading without diagnosis of hypertension 08/25/2013  . Pre-diabetes 05/28/2013  . Incontinence 09/06/2011  . Kyphosis 08/08/2010  . Severe intellectual disabilities 12/18/2006    The following portions of the patient's history were reviewed and updated as appropriate: allergies, current medications and problem list.  Physical Exam:  Filed Vitals:   07/05/15 1347  BP: 139/91  Pulse: 123  Height: 5' 1"  (1.549 m)  Weight: 253 lb (114.76 kg)   BP 139/91 mmHg  Pulse 123  Ht 5' 1"  (1.549 m)  Wt 253 lb (114.76 kg)  BMI 47.83 kg/m2 Body mass index: body mass index is 47.83 kg/(m^2). Facility age limit for growth percentiles is 20 years.  Physical Exam  Constitutional: No distress.  HENT:  Mouth/Throat: Oropharynx is clear and moist. No oropharyngeal exudate.  Neck: No thyromegaly present.  Cardiovascular: Normal rate and regular rhythm.   No murmur heard. Pulmonary/Chest: Breath sounds normal.  Abdominal: Soft. There is no tenderness.  Musculoskeletal: She exhibits no edema.  Lymphadenopathy:    She has no cervical adenopathy.    Wt Readings from Last 3 Encounters:  07/05/15 253 lb (114.76 kg)  02/23/15 251 lb (113.853 kg)  11/22/14 245 lb (111.131 kg)    POCT today Hgb = 12 HgbA1C =  5.6  Assessment/Plan: 1. PCOS (polycystic ovarian syndrome) Pt diagnosed based on oligomenorrhea and elevated testosterone.  Pt on Metformin XR 1500 mg daily for treatment and is having menses at least q 3 months.  Pt could benefit from OCPs or from LARC in future to protect her given severe intellectual disability.  2. Prediabetes Continue q 3 month hgba1c and continue metformin XR 1500 mg daily.  Continue to work on diet and exercise changes.  Has seen dietitian previously.  3. Elevated blood pressure  reading without diagnosis of hypertension Discussed with mother that home BP monitoring may be indicated to determine if BP elevation is persistent or due to increased agitation when in doctor's office.  4. Severe intellectual disabilities Pt is in mother's custody and continues to receive daily services through [facility].  5. Iron deficiency anemia Pt was on iron but normal hgb today.  Advised can d/c iron.  6. Vitamin D deficiency Advise of need to continue vitamin D consistently.  Advised level needs to be rechecked with future PCP.  Due to patient having medicare and this provider not being credentialed with medicare I cannot order indicated laboratory studies or procedures.  Pt will be transitioning to an adult care provider.    Labs indicated:  Hgba1c, Vitamin D, Urine GC/CT, HIV, lipid panel, CMP, CBC.  Pt also needs EKG.  Discussed list of active medical problems with mother today.  Discussed future labs indicated with mother and appt was scheduled with new PCP, Dr. Gilford Rile.  Follow-up:  Return for appt with new PCP, Dr. Gilford Rile.   Medical decision-making:  > 40 minutes spent, more than 50% of appointment was spent discussing diagnosis and management of symptoms

## 2015-07-05 NOTE — Progress Notes (Signed)
Pre-Visit Planning  Amanda Snyder  is a 20 y.o. female referred by No primary care provider on file..   Last seen in [facility] on 02/2015 for PCOS, vit D deficiency, adbn pain, prediabetes.   Previous Psych Screenings? NA  Treatment plan at last visit included discussion of more careful dietary intervention and exercise.  Continue metformin.  Vitamin D level rechecked.  Mother was concerned about possible h pylori because mother had h pylori.  Pt was unable to complete h pylori breath test.   Clinical Staff Visit Tasks:   - Urine GC/CT due? yes,  But unable to order due to insurance - Psych Screenings Due? NA - FS Hgb and HgbA1cC  Provider Visit Tasks: - Review PCOS symptoms - Refill any medications needed - Advise this is the last visit at [facility].  Advised of lab testing indicated but cannot perform at this time due to billing issues as patient has medicare.  - Advise to discuss h pylori testing with next MD - Assist with scheduling appt with PCP for patient - Advised that visit with new PCP can include other labs due included CBC and vitamin D - [place] Involvement? Yes - Pertinent Labs? Yes,  Component     Latest Ref Rng 03/10/2015  Sodium     135 - 146 mmol/L 139  Potassium     3.5 - 5.3 mmol/L 4.4  Chloride     98 - 110 mmol/L 102  CO2     20 - 31 mmol/L 29  Glucose     65 - 99 mg/dL 81  BUN     7 - 25 mg/dL 12  Creatinine     0.50 - 1.10 mg/dL 0.58  Total Bilirubin     0.2 - 1.2 mg/dL 0.2  Alkaline Phosphatase     33 - 115 U/L 64  AST     10 - 30 U/L 24  ALT     6 - 29 U/L 43 (H)  Total Protein     6.1 - 8.1 g/dL 7.1  Albumin     3.6 - 5.1 g/dL 3.7  Calcium     8.6 - 10.2 mg/dL 9.2  Hemoglobin A1C     <5.7 % 5.8 (H)  Mean Plasma Glucose     <117 mg/dL 120 (H)  Vitamin D, 25-Hydroxy     30 - 100 ng/mL 16 (L)    PCOS Labs & Referrals:  - Hgba1c annually if normal, every 3 months if abnormal: Due NOW - CMP annually if normal, as needed  if abnormal: Due 03/2016 - CBC annually if normal, as needed if abnormal: Due NOW - Lipid every 2 years if normal, annually if abnormal: Due 11/2015 - Vitamin D annually if normal, as needed if abnormal: Due NOW if taking vitamin D - Nutrition referral: made previously, mother declines further referrals   >8 minutes spent reviewing records and planning for patient's visit.

## 2015-07-05 NOTE — Patient Instructions (Addendum)
Things to discuss with Amanda Snyder's new doctor:  - recheck her CBC and vitamin D - needs STD testing - review her blood pressure - review that she has PCOS, on metformin - discuss need for GYN exam (under sedation) including internal exam with PAP smear - evaluate for stomach pain, how to test for h. Pylori or consider other possible   [facility] - Dr. Ronette Deter Aug 16, 2015 2:30 PM [phone]  Patient Active Problem List   Diagnosis Date Noted  . Iron deficiency anemia 08/03/2014  . Vitamin D deficiency 05/20/2014  . Exotropia of left eye 03/17/2014  . Anaphylaxis due to food 11/11/2013  . Morbid obesity ([place]) 11/11/2013  . PCOS (polycystic ovarian syndrome) 11/11/2013  . Patient requests no residents 11/11/2013  . Elevated blood pressure reading without diagnosis of hypertension 08/25/2013  . Pre-diabetes 05/28/2013  . Incontinence 09/06/2011  . Kyphosis 08/08/2010  . Severe intellectual disabilities 12/18/2006

## 2015-07-06 ENCOUNTER — Encounter: Payer: Self-pay | Admitting: *Deleted

## 2015-07-06 NOTE — Telephone Encounter (Signed)
Please void previous message pertaining to cancellation due to insurance issues, medicare can be acknowledged. Thanks

## 2015-08-16 ENCOUNTER — Ambulatory Visit (INDEPENDENT_AMBULATORY_CARE_PROVIDER_SITE_OTHER): Payer: Medicare Other | Admitting: Internal Medicine

## 2015-08-16 ENCOUNTER — Ambulatory Visit: Payer: Medicare Other | Admitting: Internal Medicine

## 2015-08-16 ENCOUNTER — Encounter: Payer: Self-pay | Admitting: Internal Medicine

## 2015-08-16 VITALS — BP 122/74 | HR 103 | Temp 98.4°F | Ht 61.5 in | Wt 253.0 lb

## 2015-08-16 DIAGNOSIS — N920 Excessive and frequent menstruation with regular cycle: Secondary | ICD-10-CM | POA: Diagnosis not present

## 2015-08-16 DIAGNOSIS — E282 Polycystic ovarian syndrome: Secondary | ICD-10-CM | POA: Diagnosis not present

## 2015-08-16 DIAGNOSIS — K009 Disorder of tooth development, unspecified: Secondary | ICD-10-CM

## 2015-08-16 DIAGNOSIS — R1084 Generalized abdominal pain: Secondary | ICD-10-CM | POA: Diagnosis not present

## 2015-08-16 DIAGNOSIS — E559 Vitamin D deficiency, unspecified: Secondary | ICD-10-CM

## 2015-08-16 DIAGNOSIS — D509 Iron deficiency anemia, unspecified: Secondary | ICD-10-CM | POA: Diagnosis not present

## 2015-08-16 DIAGNOSIS — F72 Severe intellectual disabilities: Secondary | ICD-10-CM

## 2015-08-16 DIAGNOSIS — R1011 Right upper quadrant pain: Secondary | ICD-10-CM | POA: Insufficient documentation

## 2015-08-16 LAB — CBC WITH DIFFERENTIAL/PLATELET
BASOS ABS: 0 10*3/uL (ref 0.0–0.1)
Basophils Relative: 0.4 % (ref 0.0–3.0)
EOS ABS: 0.2 10*3/uL (ref 0.0–0.7)
Eosinophils Relative: 2.4 % (ref 0.0–5.0)
HCT: 37 % (ref 36.0–46.0)
HEMOGLOBIN: 11.6 g/dL — AB (ref 12.0–15.0)
LYMPHS PCT: 36.3 % (ref 12.0–46.0)
Lymphs Abs: 3.2 10*3/uL (ref 0.7–4.0)
MCHC: 31.4 g/dL (ref 30.0–36.0)
MCV: 70.2 fl — ABNORMAL LOW (ref 78.0–100.0)
Monocytes Absolute: 0.7 10*3/uL (ref 0.1–1.0)
Monocytes Relative: 7.4 % (ref 3.0–12.0)
NEUTROS PCT: 53.5 % (ref 43.0–77.0)
Neutro Abs: 4.7 10*3/uL (ref 1.4–7.7)
PLATELETS: 495 10*3/uL — AB (ref 150.0–400.0)
RBC: 5.26 Mil/uL — AB (ref 3.87–5.11)
RDW: 16.5 % — AB (ref 11.5–15.5)
WBC: 8.8 10*3/uL (ref 4.0–10.5)

## 2015-08-16 LAB — TSH: TSH: 1.86 u[IU]/mL (ref 0.35–4.50)

## 2015-08-16 LAB — COMPREHENSIVE METABOLIC PANEL
ALK PHOS: 59 U/L (ref 39–117)
ALT: 36 U/L — AB (ref 0–35)
AST: 20 U/L (ref 0–37)
Albumin: 4 g/dL (ref 3.5–5.2)
BILIRUBIN TOTAL: 0.2 mg/dL (ref 0.2–1.2)
BUN: 10 mg/dL (ref 6–23)
CALCIUM: 9.2 mg/dL (ref 8.4–10.5)
CO2: 27 meq/L (ref 19–32)
Chloride: 103 mEq/L (ref 96–112)
Creatinine, Ser: 0.53 mg/dL (ref 0.40–1.20)
GFR: 185.26 mL/min (ref >60.00)
GLUCOSE: 127 mg/dL — AB (ref 70–99)
POTASSIUM: 3.6 meq/L (ref 3.5–5.1)
Sodium: 138 mEq/L (ref 135–145)
TOTAL PROTEIN: 7.4 g/dL (ref 6.0–8.3)

## 2015-08-16 LAB — HEMOGLOBIN A1C: Hgb A1c MFr Bld: 5.9 % (ref 4.6–6.5)

## 2015-08-16 LAB — VITAMIN D 25 HYDROXY (VIT D DEFICIENCY, FRACTURES): VITD: 27.78 ng/mL — AB (ref 30.00–100.00)

## 2015-08-16 LAB — FERRITIN: Ferritin: 14.9 ng/mL (ref 10.0–291.0)

## 2015-08-16 MED ORDER — METFORMIN HCL ER 500 MG PO TB24: ORAL_TABLET | ORAL | Status: DC

## 2015-08-16 NOTE — Assessment & Plan Note (Signed)
IDA likely secondary to menstrual blood losses. Will check ferritin and CBC. Encouraged pt mother to consider interventions to help with menstrual blood losses.

## 2015-08-16 NOTE — Patient Instructions (Signed)
It was nice to meet you.  Labs today.  We will set up an evaluation with Dr. Garwin Brothers.  Follow up in 4 weeks.

## 2015-08-16 NOTE — Assessment & Plan Note (Signed)
Heavy menstrual bleeding. Discussed options of endometrial ablation with mother and encouraged evaluation with GYN. Referral placed. Mother reports she still has "hope" that her daughter will have children and live normally. Discussed realistic expectations and potential long term risks of menorrhagia.

## 2015-08-16 NOTE — Assessment & Plan Note (Signed)
Discussed PCOS and importance of healthy diet, and continued use of metformin. Will recheck A1c today.

## 2015-08-16 NOTE — Assessment & Plan Note (Signed)
Mother is worried about dental caries. Very difficult to examine, as pt will not open her mouth. Will set up dental evaluation at [facility] with hope for dental exam and any necessary intervention under anesthesia.

## 2015-08-16 NOTE — Assessment & Plan Note (Signed)
Wt Readings from Last 3 Encounters:  08/16/15 253 lb (114.76 kg)  07/05/15 253 lb (114.76 kg)  02/23/15 251 lb (113.853 kg)   Encouraged efforts at healthy diet and physical activity.

## 2015-08-16 NOTE — Assessment & Plan Note (Signed)
Severe intellectual disability. Her mother seems to have unrealistic expectations about her ability to function independently in the long term, for example, saying that she hopes her daughter will be able to have children and live normally in the future. Will look into resources for long term care developmental specialist.

## 2015-08-16 NOTE — Progress Notes (Signed)
Pre visit review using our clinic review tool, if applicable. No additional management support is needed unless otherwise documented below in the visit note. 
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
## 2015-08-16 NOTE — Assessment & Plan Note (Signed)
Repeat Vit D level with labs today.

## 2015-08-16 NOTE — Assessment & Plan Note (Signed)
Abdominal pain reported by mother. Limited exam allowed by pt normal today. Discussed referral to GYN for pelvic exam under anesthesia. Could potentially get [place] at that time. Will check for H. Pylori per mother's request, however suspicion low.

## 2015-08-17 ENCOUNTER — Telehealth: Payer: Self-pay | Admitting: Internal Medicine

## 2015-08-17 NOTE — Telephone Encounter (Signed)
Follow up.

## 2015-08-21 LAB — H PYLORI, IGM, IGG, IGA AB: H. pylori, IgA Abs: <9 units (ref 0.0–8.9)

## 2015-09-13 DIAGNOSIS — Z124 Encounter for screening for malignant neoplasm of cervix: Secondary | ICD-10-CM | POA: Diagnosis not present

## 2015-09-13 DIAGNOSIS — N920 Excessive and frequent menstruation with regular cycle: Secondary | ICD-10-CM | POA: Diagnosis not present

## 2015-09-15 ENCOUNTER — Ambulatory Visit: Payer: Medicare Other | Admitting: Internal Medicine

## 2015-10-12 DIAGNOSIS — N920 Excessive and frequent menstruation with regular cycle: Secondary | ICD-10-CM | POA: Diagnosis not present

## 2015-10-18 ENCOUNTER — Other Ambulatory Visit: Payer: Self-pay

## 2015-10-18 MED ORDER — METFORMIN HCL ER 500 MG PO TB24: ORAL_TABLET | ORAL | Status: DC

## 2015-10-27 ENCOUNTER — Ambulatory Visit: Payer: Medicare Other | Admitting: Internal Medicine

## 2015-11-08 ENCOUNTER — Ambulatory Visit (INDEPENDENT_AMBULATORY_CARE_PROVIDER_SITE_OTHER): Payer: Medicare Other | Admitting: Internal Medicine

## 2015-11-08 VITALS — BP 124/88 | HR 97 | Ht 61.5 in | Wt 248.0 lb

## 2015-11-08 DIAGNOSIS — R1011 Right upper quadrant pain: Secondary | ICD-10-CM | POA: Diagnosis not present

## 2015-11-08 DIAGNOSIS — F72 Severe intellectual disabilities: Secondary | ICD-10-CM

## 2015-11-08 MED ORDER — CETIRIZINE HCL 10 MG PO TABS: 10.0000 mg | ORAL_TABLET | Freq: Every day | ORAL | 11 refills | Status: DC

## 2015-11-08 MED ORDER — FERROUS SULFATE 325 (65 FE) MG PO TABS: 325.0000 mg | ORAL_TABLET | Freq: Every day | ORAL | 3 refills | Status: DC

## 2015-11-08 NOTE — Assessment & Plan Note (Signed)
Some persistent symptoms of right upper abdominal pain. Exam today normal, but limited by body habitus. Will get [place] RUQ for evaluation.

## 2015-11-08 NOTE — Patient Instructions (Addendum)
Start Vit D 2000units daily.  We will set up ultrasound of right upper abdomen.  Follow up with Dr. Caryl Bis in 4 weeks.

## 2015-11-08 NOTE — Assessment & Plan Note (Signed)
Doing well. [facility] worker here today with her. No needs identified in home. Will continue current care.

## 2015-11-08 NOTE — Progress Notes (Signed)
Subjective:    Patient ID: Amanda Snyder, female    DOB: Feb 12, 1996, 20 y.o.   MRN: [id number]  HPI  20YO female with severe intellectual disabilities presents for follow up.  She is with her mother and her CAP worker. Her mother states she is doing well generally. She continues to intermittently complain of right upper abdominal pain. Her abdomen feels hard and distended at times. She occasionally has constipation, but no diarrhea. No NV.  She is compliant with medications.  She is scheduled for dental evaluation at [facility].  She recently had PAP and pelvic US with Dr. Garwin Brothers.   Wt Readings from Last 3 Encounters:  11/08/15 248 lb (112.5 kg)  08/16/15 253 lb (114.8 kg)  07/05/15 253 lb (114.8 kg)   BP Readings from Last 3 Encounters:  11/08/15 124/88  08/16/15 122/74  07/05/15 (!) 139/91    Past Medical History:  Diagnosis Date  . ADHD (attention deficit hyperactivity disorder) 2000   mom opted not to give medications (failed Adderall - 'zombielike', failed Strattera - 'no difference')  . Allergy    suspected allergy to pollen or dog(s)  . Hypertension   . Mental retardation 1997   Severe MR. did not walk until age 27-3 years, received Early Intervention.  . Obesity 12  . Premature birth with gestation of 35-36 weeks    [redacted] weeks GA  . Seizures ([place]) From age 11 month to 6 months   took Phenobarbital for a few years. weaned off after moving from [place] to [place]. Unknown etiology.  . Syncope 2006   witnessed by sister, no workup   Family History  Problem Relation Age of Onset  . Stroke Mother 70  . Psoriasis Mother   . Alcohol abuse Mother   . Hyperlipidemia Mother   . Mental retardation Mother   . Hypertension Mother   . Cancer Maternal Grandmother 48    breast ca  . Breast cancer Maternal Grandmother   . Alcohol abuse Brother   . Alcohol abuse Maternal Grandfather   . Diabetes Paternal Grandmother    Past Surgical History:  Procedure Laterality Date  . NO PAST  SURGERIES     Social History   Social History  . Marital status: Single    Spouse name: N/A  . Number of children: N/A  . Years of education: N/A   Social History Main Topics  . Smoking status: Never Smoker  . Smokeless tobacco: Never Used  . Alcohol use No  . Drug use: No  . Sexual activity: Not on file   Other Topics Concern  . Not on file   Social History Narrative   Lives at home with mom and youngest sister (2 years younger).      Attends [employer] on [employer] - will age out at 29 years. 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 Has IEP. She will transition into a day program in June.      Mother maintains guardianship (beyond 25 years of age).             Review of Systems  Unable to perform ROS: Psychiatric disorder  Severe intellectual disabilities     Objective:    BP 124/88   Pulse 97   Ht 5' 1.5" (1.562 m)   Wt 248 lb (112.5 kg)   SpO2 97%   BMI 46.10 kg/m  Physical Exam  Constitutional: She appears well-developed and well-nourished. No distress.  HENT:  Head: Normocephalic and atraumatic.  Right Ear: External ear normal.  Left Ear: External ear normal.  Nose: Nose normal.  Mouth/Throat: Oropharynx is clear and moist. No oropharyngeal exudate.  Eyes: Conjunctivae are normal. Pupils are equal, round, and reactive to light. Right eye exhibits no discharge. Left eye exhibits no discharge. No scleral icterus.  Neck: Normal range of motion. Neck supple. No tracheal deviation present. No thyromegaly present.  Cardiovascular: Normal rate, regular rhythm, normal heart sounds and intact distal pulses.  Exam reveals no gallop and no friction rub.   No murmur heard. Pulmonary/Chest: Effort normal and breath sounds normal. No respiratory distress. She has no wheezes. She has no rales. She exhibits no tenderness.  Abdominal: Soft. Bowel sounds are normal. She exhibits no distension. There is no  tenderness. There is no guarding.  Musculoskeletal: Normal range of motion. She exhibits no edema or tenderness.  Lymphadenopathy:    She has no cervical adenopathy.  Neurological: She is alert. No cranial nerve deficit. She exhibits normal muscle tone. Coordination normal.  Skin: Skin is warm and dry. No rash noted. She is not diaphoretic. No erythema. No pallor.  Psychiatric: Her affect is labile. Her speech is delayed. She is agitated. Cognition and memory are impaired. She expresses impulsivity and inappropriate judgment.  Hollin will follow some simple commands but will not answer questions. She frequently blurts out profanities and intermittently hits self.          Assessment & Plan:   Problem List Items Addressed This Visit      Unprioritized   Abdominal pain, right upper quadrant    Some persistent symptoms of right upper abdominal pain. Exam today normal, but limited by body habitus. Will get [place] RUQ for evaluation.       Relevant Orders   US Abdomen Limited RUQ   Severe intellectual disabilities - Primary    Doing well. CAP worker here today with her. No needs identified in home. Will continue current care.       Other Visit Diagnoses   None.      Return in about 4 weeks (around 12/06/2015) for New Patient.  Ronette Deter, MD Internal Medicine [facility]

## 2015-11-16 ENCOUNTER — Ambulatory Visit
Admission: RE | Admit: 2015-11-16 | Discharge: 2015-11-16 | Disposition: A | Discharge status: Home / Self Care | Payer: Medicare Other | Source: Ambulatory Visit | Attending: Internal Medicine | Admitting: Internal Medicine

## 2015-11-16 ENCOUNTER — Ambulatory Visit: Payer: Medicare Other

## 2015-11-16 DIAGNOSIS — R1011 Right upper quadrant pain: Secondary | ICD-10-CM | POA: Insufficient documentation

## 2015-11-16 DIAGNOSIS — R932 Abnormal findings on diagnostic imaging of liver and biliary tract: Secondary | ICD-10-CM | POA: Diagnosis not present

## 2015-11-16 DIAGNOSIS — R109 Unspecified abdominal pain: Secondary | ICD-10-CM | POA: Diagnosis not present

## 2015-12-27 ENCOUNTER — Ambulatory Visit: Payer: Medicare Other | Admitting: Family Medicine

## 2016-01-24 ENCOUNTER — Ambulatory Visit (INDEPENDENT_AMBULATORY_CARE_PROVIDER_SITE_OTHER): Payer: Medicare Other | Admitting: Family Medicine

## 2016-01-24 ENCOUNTER — Encounter: Payer: Self-pay | Admitting: Family Medicine

## 2016-01-24 VITALS — BP 118/78 | HR 105 | Temp 97.8°F | Wt 255.4 lb

## 2016-01-24 DIAGNOSIS — D170 Benign lipomatous neoplasm of skin and subcutaneous tissue of head, face and neck: Secondary | ICD-10-CM

## 2016-01-24 DIAGNOSIS — W57XXXA Bitten or stung by nonvenomous insect and other nonvenomous arthropods, initial encounter: Secondary | ICD-10-CM | POA: Insufficient documentation

## 2016-01-24 DIAGNOSIS — F72 Severe intellectual disabilities: Secondary | ICD-10-CM

## 2016-01-24 DIAGNOSIS — D509 Iron deficiency anemia, unspecified: Secondary | ICD-10-CM

## 2016-01-24 DIAGNOSIS — Z111 Encounter for screening for respiratory tuberculosis: Secondary | ICD-10-CM

## 2016-01-24 DIAGNOSIS — E559 Vitamin D deficiency, unspecified: Secondary | ICD-10-CM

## 2016-01-24 DIAGNOSIS — D179 Benign lipomatous neoplasm, unspecified: Secondary | ICD-10-CM | POA: Insufficient documentation

## 2016-01-24 DIAGNOSIS — K760 Fatty (change of) liver, not elsewhere classified: Secondary | ICD-10-CM | POA: Diagnosis not present

## 2016-01-24 DIAGNOSIS — Z87898 Personal history of other specified conditions: Secondary | ICD-10-CM | POA: Insufficient documentation

## 2016-01-24 DIAGNOSIS — R7303 Prediabetes: Secondary | ICD-10-CM

## 2016-01-24 LAB — CBC
HCT: 36.8 % (ref 36.0–46.0)
Hemoglobin: 11.9 g/dL — ABNORMAL LOW (ref 12.0–15.0)
MCHC: 32.3 g/dL (ref 30.0–36.0)
MCV: 69.7 fl — ABNORMAL LOW (ref 78.0–100.0)
PLATELETS: 461 10*3/uL — AB (ref 150.0–400.0)
RBC: 5.28 Mil/uL — ABNORMAL HIGH (ref 3.87–5.11)
RDW: 15.8 % — AB (ref 11.5–15.5)
WBC: 7.9 10*3/uL (ref 4.0–10.5)

## 2016-01-24 LAB — COMPREHENSIVE METABOLIC PANEL
ALBUMIN: 4.1 g/dL (ref 3.5–5.2)
ALK PHOS: 55 U/L (ref 39–117)
ALT: 51 U/L — AB (ref 0–35)
AST: 29 U/L (ref 0–37)
BILIRUBIN TOTAL: 0.3 mg/dL (ref 0.2–1.2)
BUN: 10 mg/dL (ref 6–23)
CO2: 25 mEq/L (ref 19–32)
Calcium: 9.7 mg/dL (ref 8.4–10.5)
Chloride: 101 mEq/L (ref 96–112)
Creatinine, Ser: 0.54 mg/dL (ref 0.40–1.20)
GFR: 180.58 mL/min (ref >60.00)
GLUCOSE: 97 mg/dL (ref 70–99)
Potassium: 3.7 mEq/L (ref 3.5–5.1)
Sodium: 134 mEq/L — ABNORMAL LOW (ref 135–145)
TOTAL PROTEIN: 7.7 g/dL (ref 6.0–8.3)

## 2016-01-24 LAB — VITAMIN D 25 HYDROXY (VIT D DEFICIENCY, FRACTURES): VITD: 24.55 ng/mL — AB (ref 30.00–100.00)

## 2016-01-24 LAB — FERRITIN: Ferritin: 23.5 ng/mL (ref 10.0–291.0)

## 2016-01-24 NOTE — Assessment & Plan Note (Signed)
History of seizures as a child. None since she was 68 months old. Continue to monitor.

## 2016-01-24 NOTE — Progress Notes (Signed)
  [name], MD Phone: [phone]  Amanda Snyder is a 20 y.o. female who presents today for follow-up.  Patient's mother reports things are going well. She is here with paperwork for daycare and scat transportation.  Found to have fatty liver on recent ultrasound. Patient's mother notes that patient pretty much eats anything and everything. Lots of sugary things. No nausea, vomiting, diarrhea. No abdominal pain. Has received her hepatitis B vaccinations.  prediabetes: Patient's mother notes she is on metformin to help with this. Sugar has been good. They want to continue metformin.  Patient's mother notes several bug bites on her left forearm. They have been there several days. No spreading redness. They do appear excoriated.  Lipoma: Patient's mother reports that she has a hump on the back of her neck that has been present for some time. Notes they saw another physician recently who advised that it was a fatty tumor. It does not swell. There is no pain. There is no erythema.  History of seizures: No recent seizures. None since she was 72 months old. She has not been on medications since he was 20 years old. Does not see neurology anymore.  PMH: nonsmoker.   ROS see history of present illness  Objective  Physical Exam Vitals:   01/24/16 1109 01/24/16 1243  BP: 118/78   Pulse: (!) 111 (!) 105  Temp: 97.8 F (36.6 C)     BP Readings from Last 3 Encounters:  01/24/16 118/78  11/08/15 124/88  08/16/15 122/74   Wt Readings from Last 3 Encounters:  01/24/16 255 lb 6.4 oz (115.8 kg)  11/08/15 248 lb (112.5 kg)  08/16/15 253 lb (114.8 kg)    Physical Exam  Constitutional: No distress.  Cardiovascular: Normal heart sounds.   Mildly tachycardic  Pulmonary/Chest: Effort normal and breath sounds normal.  Abdominal: Soft. She exhibits no distension. There is no tenderness. There is no rebound and no guarding.  Musculoskeletal: She exhibits no edema.  Likely lipoma over  or cervical neck region  Neurological: She is alert. Gait normal.  Skin: Skin is warm and dry. She is not diaphoretic.  3 excoriated papules on left forearm that are nontender with no surrounding erythema  Psychiatric:  Patient does not directly answer any questions, intermittently curses, intermittently hits herself     Assessment/Plan: Please see individual problem list.  Pre-diabetes Continue metformin. Check blood sugar today.  Severe intellectual disabilities Stable. Paperwork will be filled out.  Iron deficiency anemia Check CBC and iron studies.  Vitamin D deficiency Check vitamin D level.  Fatty liver Ultrasound most recently revealed likely fatty liver. Discussed need to evaluate for hepatitis serologies. Also discussed diet and exercise with mother. She'll work on diet. We will continue to monitor liver function tests.  Bug bites Lesion on left forearm most consistent with excoriated bug bites. They will continue to monitor. Given return precautions.  Lipoma Area over distal neck posteriorly likely lipoma based on exam. We'll continue to monitor this area.  History of seizures History of seizures as a child. None since she was 2 months old. Continue to monitor.   Orders Placed This Encounter  Procedures  . Quantiferon tb gold assay  . Hepatitis C Antibody  . Hepatitis A Ab, Total  . Comp Met (CMET)  . CBC  . Iron and TIBC  . Ferritin  . VITAMIN D 25 Hydroxy (Vit-D Deficiency, Fractures)    [name], MD [place]

## 2016-01-24 NOTE — Assessment & Plan Note (Signed)
Check vitamin D level 

## 2016-01-24 NOTE — Assessment & Plan Note (Addendum)
Check CBC and iron studies.

## 2016-01-24 NOTE — Assessment & Plan Note (Signed)
Lesion on left forearm most consistent with excoriated bug bites. They will continue to monitor. Given return precautions.

## 2016-01-24 NOTE — Patient Instructions (Signed)
Nice to see you. We are going to check some lab work and call with the results. Please monitor the blood bites on her arm and if they have spreading redness, pain, drainage, or tenderness these let us know immediately. Please work on diet. I have included some diet instructions.  Diet Recommendations  Starchy (carb) foods: Bread, rice, pasta, potatoes, corn, cereal, grits, crackers, bagels, muffins, all baked goods.  (Fruits, milk, and yogurt also have carbohydrate, but most of these foods will not spike your blood sugar as the starchy foods will.)  A few fruits do cause high blood sugars; use small portions of bananas (limit to 1/2 at a time), grapes, watermelon, oranges, and most tropical fruits.    Protein foods: Meat, fish, poultry, eggs, dairy foods, and beans such as pinto and kidney beans (beans also provide carbohydrate).   1. Eat at least 3 meals and 1-2 snacks per day. Never go more than 4-5 hours while awake without eating. Eat breakfast within the first hour of getting up.   2. Limit starchy foods to TWO per meal and ONE per snack. ONE portion of a starchy  food is equal to the following:   - ONE slice of bread (or its equivalent, such as half of a hamburger bun).   - 1/2 cup of a "scoopable" starchy food such as potatoes or rice.   - 15 grams of carbohydrate as shown on food label.  3. Include at every meal: a protein food, a carb food, and vegetables and/or fruit.   - Obtain twice the volume of veg's as protein or carbohydrate foods for both lunch and dinner.   - Fresh or frozen veg's are best.   - Keep frozen veg's on hand for a quick vegetable serving.

## 2016-01-24 NOTE — Assessment & Plan Note (Signed)
Area over distal neck posteriorly likely lipoma based on exam. We'll continue to monitor this area.

## 2016-01-24 NOTE — Assessment & Plan Note (Signed)
Stable. Paperwork will be filled out.

## 2016-01-24 NOTE — Assessment & Plan Note (Signed)
Ultrasound most recently revealed likely fatty liver. Discussed need to evaluate for hepatitis serologies. Also discussed diet and exercise with mother. She'll work on diet. We will continue to monitor liver function tests.

## 2016-01-24 NOTE — Assessment & Plan Note (Signed)
Continue metformin. Check blood sugar today.

## 2016-01-24 NOTE — Progress Notes (Signed)
Pre visit review using our clinic review tool, if applicable. No additional management support is needed unless otherwise documented below in the visit note. 

## 2016-01-25 LAB — HEPATITIS C ANTIBODY: HCV Ab: NEGATIVE

## 2016-01-25 LAB — IRON AND TIBC
%SAT: 23 % (ref 11–50)
IRON: 74 ug/dL (ref 40–190)
TIBC: 315 ug/dL (ref 250–450)
UIBC: 241 ug/dL (ref 125–400)

## 2016-01-25 LAB — HEPATITIS A ANTIBODY, TOTAL: Hep A Total Ab: NONREACTIVE

## 2016-01-26 LAB — QUANTIFERON TB GOLD ASSAY (BLOOD)
INTERFERON GAMMA RELEASE ASSAY: NEGATIVE
QUANTIFERON NIL VALUE: 0.02 [IU]/mL
QUANTIFERON TB AG MINUS NIL: 0.01 [IU]/mL

## 2016-02-20 ENCOUNTER — Telehealth: Payer: Self-pay | Admitting: Family Medicine

## 2016-02-20 DIAGNOSIS — D509 Iron deficiency anemia, unspecified: Secondary | ICD-10-CM | POA: Diagnosis not present

## 2016-02-20 DIAGNOSIS — G40909 Epilepsy, unspecified, not intractable, without status epilepticus: Secondary | ICD-10-CM | POA: Diagnosis not present

## 2016-02-20 DIAGNOSIS — Z01818 Encounter for other preprocedural examination: Secondary | ICD-10-CM | POA: Diagnosis not present

## 2016-02-20 DIAGNOSIS — E118 Type 2 diabetes mellitus with unspecified complications: Secondary | ICD-10-CM | POA: Diagnosis not present

## 2016-02-20 DIAGNOSIS — F79 Unspecified intellectual disabilities: Secondary | ICD-10-CM | POA: Diagnosis not present

## 2016-02-20 NOTE — Telephone Encounter (Signed)
PT's mother stopped by to get a hard copy of the [place] paper work. It was faxed to her but she did not receive it all and her printer ran out of ink. Please call her at [phone].

## 2016-02-22 NOTE — Telephone Encounter (Signed)
LM for patients mother to return call

## 2016-02-23 NOTE — Telephone Encounter (Signed)
Please sign the forms and I will mail to patient. I have filled in all the questions you had ask about. She does not need diet order. Forms on your desk

## 2016-02-23 NOTE — Telephone Encounter (Signed)
LM for patients mother to return call

## 2016-02-23 NOTE — Telephone Encounter (Signed)
Pt's mother returned office phone call. She can be reach at [phone].

## 2016-02-26 NOTE — Telephone Encounter (Signed)
Forms copied and put up front to mail

## 2016-02-26 NOTE — Telephone Encounter (Signed)
Forms completed

## 2016-03-07 DIAGNOSIS — K029 Dental caries, unspecified: Secondary | ICD-10-CM | POA: Diagnosis not present

## 2016-04-26 ENCOUNTER — Ambulatory Visit: Payer: Medicare Other | Admitting: Family Medicine

## 2016-06-18 ENCOUNTER — Ambulatory Visit (INDEPENDENT_AMBULATORY_CARE_PROVIDER_SITE_OTHER): Payer: Medicare Other | Admitting: Family Medicine

## 2016-06-18 ENCOUNTER — Encounter: Payer: Self-pay | Admitting: Family Medicine

## 2016-06-18 VITALS — BP 118/80 | HR 106 | Temp 98.4°F | Wt 263.4 lb

## 2016-06-18 DIAGNOSIS — F72 Severe intellectual disabilities: Secondary | ICD-10-CM

## 2016-06-18 DIAGNOSIS — R7303 Prediabetes: Secondary | ICD-10-CM

## 2016-06-18 DIAGNOSIS — R1011 Right upper quadrant pain: Secondary | ICD-10-CM | POA: Diagnosis not present

## 2016-06-18 DIAGNOSIS — N920 Excessive and frequent menstruation with regular cycle: Secondary | ICD-10-CM

## 2016-06-18 NOTE — Assessment & Plan Note (Signed)
Check A1c and CMP.

## 2016-06-18 NOTE — Assessment & Plan Note (Signed)
Refer to neurology for further evaluation of patient's intellectual disability and for patient's mother's concern for Tourette's.

## 2016-06-18 NOTE — Assessment & Plan Note (Signed)
Mom reports heavy menstrual cycles. Notes cycles are regular. Takes ibuprofen with them. Previously evaluated by gynecology. Continue to monitor. Check CBC.

## 2016-06-18 NOTE — Progress Notes (Signed)
  [name], MD Phone: [phone]  Amanda Snyder is a 21 y.o. female who presents today for follow-up.  Mental retardation: Patient continues to have verbal outbursts. The mom has been unable to get her in to a day program or onto transportation. Rarely she is completely appropriate though this occurs very briefly and not that frequently. Has not seen neurology since she was a child. Has not been violent.  Prediabetes: Mom reports she is always thirsty and sometimes has increased urination. She's currently on metformin.  Mother does note that the patient complains of abdominal discomfort at times. This is been a chronic issue and is unchanged from previously. She has normal bowel movements. She has a menstrual cycle every month that has been heavy at times though occurs on a regular cycle. Does take ibuprofen for menstrual cramps. Has been evaluated by gynecology for this. Also had a right upper quadrant ultrasound. No blood in her stool. No diarrhea.  PMH: Nonsmoker.   ROS see history of present illness  Objective  Physical Exam Vitals:   06/18/16 1610  BP: 118/80  Pulse: (!) 106  Temp: 98.4 F (36.9 C)    BP Readings from Last 3 Encounters:  06/18/16 118/80  01/24/16 118/78  11/08/15 124/88   Wt Readings from Last 3 Encounters:  06/18/16 263 lb 6.4 oz (119.5 kg)  01/24/16 255 lb 6.4 oz (115.8 kg)  11/08/15 248 lb (112.5 kg)    Physical Exam  Constitutional: No distress.  Cardiovascular: Normal heart sounds.   Mildly tachycardic  Pulmonary/Chest: Effort normal and breath sounds normal.  Abdominal: Soft. Bowel sounds are normal. She exhibits no distension. There is no tenderness. There is no guarding.  Musculoskeletal: She exhibits no edema.  Neurological: She is alert.  Skin: Skin is warm and dry. She is not diaphoretic.     Assessment/Plan: Please see individual problem list.  Abdominal pain, right upper quadrant Patient with chronic intermittent  abdominal discomfort. It has been mostly in the right upper quadrant. Benign exam. Prior evaluation unremarkable. Difficult to elicit history from the patient. No alarm symptoms. They will continue to monitor. Given return precautions.  Menorrhagia with regular cycle Mom reports heavy menstrual cycles. Notes cycles are regular. Takes ibuprofen with them. Previously evaluated by gynecology. Continue to monitor. Check CBC.  Pre-diabetes Check A1c and CMP.  Severe intellectual disabilities Refer to neurology for further evaluation of patient's intellectual disability and for patient's mother's concern for Tourette's.   Orders Placed This Encounter  Procedures  . Comp Met (CMET)  . CBC  . HgB A1c  . Ambulatory referral to Neurology    Referral Priority:   Routine    Referral Type:   Consultation    Referral Reason:   Specialty Services Required    Requested Specialty:   Neurology    Number of Visits Requested:   [place], MD [place]

## 2016-06-18 NOTE — Assessment & Plan Note (Signed)
Patient with chronic intermittent abdominal discomfort. It has been mostly in the right upper quadrant. Benign exam. Prior evaluation unremarkable. Difficult to elicit history from the patient. No alarm symptoms. They will continue to monitor. Given return precautions.

## 2016-06-18 NOTE — Patient Instructions (Signed)
Nice to see you. We will get you referred to neurology. We'll get some lab work today and contact you with the results.

## 2016-06-19 LAB — CBC
HEMATOCRIT: 36.9 % (ref 36.0–46.0)
HEMOGLOBIN: 11.6 g/dL — AB (ref 12.0–15.0)
MCHC: 31.4 g/dL (ref 30.0–36.0)
Platelets: 489 10*3/uL — ABNORMAL HIGH (ref 150.0–400.0)
RBC: 5.29 Mil/uL — AB (ref 3.87–5.11)
RDW: 15.2 % (ref 11.5–15.5)
WBC: 8.8 10*3/uL (ref 4.0–10.5)

## 2016-06-19 LAB — COMPREHENSIVE METABOLIC PANEL
ALT: 41 U/L — ABNORMAL HIGH (ref 0–35)
AST: 26 U/L (ref 0–37)
Albumin: 3.8 g/dL (ref 3.5–5.2)
Alkaline Phosphatase: 54 U/L (ref 39–117)
BUN: 8 mg/dL (ref 6–23)
CALCIUM: 9.5 mg/dL (ref 8.4–10.5)
CHLORIDE: 102 meq/L (ref 96–112)
CO2: 25 meq/L (ref 19–32)
CREATININE: 0.56 mg/dL (ref 0.40–1.20)
GFR: 172.54 mL/min (ref >60.00)
Glucose, Bld: 120 mg/dL — ABNORMAL HIGH (ref 70–99)
POTASSIUM: 3.6 meq/L (ref 3.5–5.1)
Sodium: 135 mEq/L (ref 135–145)
Total Bilirubin: 0.2 mg/dL (ref 0.2–1.2)
Total Protein: 7.3 g/dL (ref 6.0–8.3)

## 2016-06-19 LAB — HEMOGLOBIN A1C: Hgb A1c MFr Bld: 6.1 % (ref 4.6–6.5)

## 2016-06-21 ENCOUNTER — Other Ambulatory Visit: Payer: Self-pay | Admitting: Family Medicine

## 2016-06-21 DIAGNOSIS — D75839 Thrombocytosis, unspecified: Secondary | ICD-10-CM

## 2016-06-21 DIAGNOSIS — D473 Essential (hemorrhagic) thrombocythemia: Secondary | ICD-10-CM

## 2016-06-26 ENCOUNTER — Ambulatory Visit (INDEPENDENT_AMBULATORY_CARE_PROVIDER_SITE_OTHER): Payer: Medicare Other | Admitting: Neurology

## 2016-06-26 ENCOUNTER — Encounter: Payer: Self-pay | Admitting: Neurology

## 2016-06-26 VITALS — BP 136/94 | HR 106 | Ht 61.5 in | Wt 266.0 lb

## 2016-06-26 DIAGNOSIS — Z87898 Personal history of other specified conditions: Secondary | ICD-10-CM | POA: Diagnosis not present

## 2016-06-26 DIAGNOSIS — F72 Severe intellectual disabilities: Secondary | ICD-10-CM | POA: Insufficient documentation

## 2016-06-26 NOTE — Progress Notes (Signed)
PATIENT: Amanda Snyder DOB: 26-May-1995  Chief Complaint  Patient presents with  . Mental Retardation    She is here with her mother, Amanda Snyder.  The patient has not been evaluated by a neurologist since early childhood.  Mother states patient will often have verbal outburst (often shouting profanities) but does not exhibit violent behavior.  These problems have caused her difficulty staying in a daytime adult program or using [employer] for transportation.  Marland Kitchen PCP    Amanda Haven, MD     HISTORICAL  Amanda Snyder is a 21 years old right-handed female, accompanied by her mother, seen in refer by  her primary care physician Dr. Leone Haven, MD for evaluation of emotional outbursts, initial evaluation was on June 26 2016.  She is #3 of 4 siblings, there was no family history of mental retardation no epilepsy, she was brought premature at 55 weeks, mother had difficulty during pregnancy recalls cerclage, prolonged vaginal delivery require induction.  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  She has significant mental retardation, able to walk at age 88, only able to perform simple sentence after taking years old, she went through special education, has stereotypical behavior, like to suck her thumb, she could not carry on normal conversation, but seems to understand conversation better than she could express herself, over the years she developed this outburst of cursing episode, usually when she is in physical pain, upset, frustrated.  She woke up almost every morning around 4-5 AM, Outburst of emotional angry cursing, she will be soothed if she was given milk and orange juice.  She has no significant worsening of her mental capacity or behavior issues.  I reviewed laboratory evaluation March 2018, A1c was  6.1, CBC showed mild anemia hemoglobin 11 point 6, CMP showed elevated glucose 120, vitamin D was 24, ferritin was 23.5, normal TSH  REVIEW OF SYSTEMS: Full 14 system review of systems performed and notable only for anxiety  ALLERGIES: Allergies  Allergen Reactions  . Clams [Shellfish Allergy]     And shrimp  . Other Other (See Comments)    Eyes and nose running    HOME MEDICATIONS: Current Outpatient Prescriptions  Medication Sig Dispense Refill  . cetirizine (ZYRTEC) 10 MG tablet Take 1 tablet (10 mg total) by mouth daily. (Patient taking differently: Take 10 mg by mouth as needed. ) 30 tablet 11  . EPINEPHrine (EPIPEN 2-PAK) 0.3 mg/0.3 mL IJ SOAJ injection Inject 0.3 mLs (0.3 mg total) into the muscle once as needed (anaphylaxis). 1 Device 0  . ferrous sulfate 325 (65 FE) MG tablet Take 1 tablet (325 mg total) by mouth daily with breakfast. (Patient taking differently: Take 325 mg by mouth as needed. ) 90 tablet 3  . metFORMIN (GLUCOPHAGE-XR) 500 MG 24 hr tablet TAKE 3 TABLETS (1,500 MG TOTAL) BY MOUTH DAILY WITH BREAKFAST. 270 tablet 1   No current facility-administered medications for this visit.     PAST MEDICAL HISTORY: Past Medical History:  Diagnosis Date  . ADHD (attention deficit hyperactivity disorder) 2000   mom opted not to give medications (failed Adderall - 'zombielike', failed Strattera - 'no difference')  . Allergy    suspected allergy to pollen or dog(s)  . Hypertension   . Mental retardation 1997   Severe MR. did not walk until age 44-3 years, received Early Intervention.  Marland Kitchen  Obesity 12  . Premature birth with gestation of 35-36 weeks    [redacted] weeks GA  . Seizures ([place]) From age 50 month to 6 months   took Phenobarbital for a few years. weaned off after moving from [place] to [place]. Unknown etiology.  . Syncope 2006   witnessed by sister, no workup    PAST SURGICAL HISTORY: Past Surgical History:  Procedure Laterality Date  . NO PAST SURGERIES      FAMILY  HISTORY: Family History  Problem Relation Age of Onset  . Stroke Mother 38  . Psoriasis Mother   . Alcohol abuse Mother   . Hyperlipidemia Mother   . Mental retardation Mother   . Hypertension Mother   . Alcohol abuse Brother   . Other Father     Died from gunshot wound.  . Cancer Maternal Grandmother 48    breast ca  . Breast cancer Maternal Grandmother   . Alcohol abuse Maternal Grandfather   . Diabetes Paternal Grandmother     SOCIAL HISTORY:  Social History   Social History  . Marital status: Single    Spouse name: N/A  . Number of children: 0  . Years of education: N/A   Occupational History  . Unemployed    Social History Main Topics  . Smoking status: Never Smoker  . Smokeless tobacco: Never Used  . Alcohol use No  . Drug use: No  . Sexual activity: Not on file   Other Topics Concern  . Not on file   Social History Narrative   Lives at home with mom and youngest sister (2 years younger).      Attends [employer] on [employer] - will age out at 53 years. 5 students in classroom with one [employer] teacher and 2 aides. Has IEP. She will transition into a day program in June.      Mother maintains guardianship (beyond 81 years of age).      Right-handed.      1-2 caffeine drinks per week.           PHYSICAL EXAM   Vitals:   06/26/16 1055  BP: (!) 136/94  Pulse: (!) 106  Weight: 266 lb (120.7 kg)  Height: 5' 1.5" (1.562 m)    Not recorded      Body mass index is 49.45 kg/m.  PHYSICAL EXAMNIATION:  Gen: NAD, conversant, well nourised, obese, well groomed                     Cardiovascular: Regular rate rhythm, no peripheral edema, warm, nontender. Eyes: Conjunctivae clear without exudates or hemorrhage Neck: Supple, no carotid bruits. Pulmonary: Clear to auscultation bilaterally   NEUROLOGICAL EXAM:  MENTAL STATUS: Does not follow commands,or carry on a conversation, smiling, beating in rhythm  spontaneously,    CRANIAL NERVES: CN II:  Pupils are round equal and briskly reactive to light. CN III, IV, VI: extraocular movement are normal. No ptosis. CN V: Facial sensation is intact to pinprick in all 3 divisions bilaterally. Corneal responses are intact.  CN VII: Face is symmetric with normal eye closure and smile. CN VIII: Hearing is normal to rubbing fingers CN IX, X: Palate elevates symmetrically. Phonation is normal. CN XI: Head turning and shoulder shrug are intact CN XII: Tongue is midline with normal movements and no atrophy.  MOTOR: There is no pronator drift of out-stretched arms. Muscle bulk and tone are normal. Muscle strength is normal.  REFLEXES:  Reflexes are 2+ and symmetric at the biceps, triceps, knees, and ankles. Plantar responses are flexor.  SENSORY: Intact to light touch, pinprick, positional sensation and vibratory sensation are intact in fingers and toes.  COORDINATION: Rapid alternating movements and fine finger movements are intact. There is no dysmetria on finger-to-nose and heel-knee-shin.    GAIT/STANCE: Unsteady  DIAGNOSTIC DATA (LABS, IMAGING, TESTING) - I reviewed patient records, labs, notes, testing and imaging myself where available.   ASSESSMENT AND PLAN  Amanda Snyder is a 21 y.o. female   Severe mental retardation Outburst of anger,  No acute neurological condition identified,  I have advised mother continue to work with the patient for behavior modification  Marcial Pacas, M.D. Ph.D.  [facility] [address] [place], [place] [address] Ph: [phone] Fax: [phone]  CC: Amanda Haven, MD

## 2016-07-05 ENCOUNTER — Inpatient Hospital Stay: Payer: Medicare Other

## 2016-07-05 ENCOUNTER — Encounter: Payer: Self-pay | Admitting: Oncology

## 2016-07-05 ENCOUNTER — Inpatient Hospital Stay: Payer: Medicare Other | Attending: Oncology | Admitting: Oncology

## 2016-07-05 VITALS — BP 112/80 | HR 98 | Temp 97.2°F | Resp 20 | Ht 63.78 in | Wt 264.0 lb

## 2016-07-05 DIAGNOSIS — D473 Essential (hemorrhagic) thrombocythemia: Secondary | ICD-10-CM | POA: Diagnosis not present

## 2016-07-05 DIAGNOSIS — F909 Attention-deficit hyperactivity disorder, unspecified type: Secondary | ICD-10-CM | POA: Diagnosis not present

## 2016-07-05 DIAGNOSIS — E282 Polycystic ovarian syndrome: Secondary | ICD-10-CM

## 2016-07-05 DIAGNOSIS — H50112 Monocular exotropia, left eye: Secondary | ICD-10-CM

## 2016-07-05 DIAGNOSIS — E119 Type 2 diabetes mellitus without complications: Secondary | ICD-10-CM | POA: Diagnosis not present

## 2016-07-05 DIAGNOSIS — K760 Fatty (change of) liver, not elsewhere classified: Secondary | ICD-10-CM | POA: Diagnosis not present

## 2016-07-05 DIAGNOSIS — Z79899 Other long term (current) drug therapy: Secondary | ICD-10-CM | POA: Diagnosis not present

## 2016-07-05 DIAGNOSIS — I10 Essential (primary) hypertension: Secondary | ICD-10-CM

## 2016-07-05 DIAGNOSIS — D509 Iron deficiency anemia, unspecified: Secondary | ICD-10-CM | POA: Diagnosis not present

## 2016-07-05 DIAGNOSIS — Z7984 Long term (current) use of oral hypoglycemic drugs: Secondary | ICD-10-CM

## 2016-07-05 DIAGNOSIS — M40209 Unspecified kyphosis, site unspecified: Secondary | ICD-10-CM

## 2016-07-05 DIAGNOSIS — F72 Severe intellectual disabilities: Secondary | ICD-10-CM | POA: Diagnosis not present

## 2016-07-05 DIAGNOSIS — G40909 Epilepsy, unspecified, not intractable, without status epilepticus: Secondary | ICD-10-CM | POA: Diagnosis not present

## 2016-07-05 DIAGNOSIS — N920 Excessive and frequent menstruation with regular cycle: Secondary | ICD-10-CM | POA: Diagnosis not present

## 2016-07-05 DIAGNOSIS — E559 Vitamin D deficiency, unspecified: Secondary | ICD-10-CM

## 2016-07-05 LAB — IRON AND TIBC
IRON: 63 ug/dL (ref 28–170)
SATURATION RATIOS: 18 % (ref 10.4–31.8)
TIBC: 347 ug/dL (ref 250–450)
UIBC: 284 ug/dL

## 2016-07-05 LAB — CBC WITH DIFFERENTIAL/PLATELET
BASOS ABS: 0.1 10*3/uL (ref 0–0.1)
Basophils Relative: 1 %
EOS ABS: 0.2 10*3/uL (ref 0–0.7)
EOS PCT: 3 %
HCT: 35.2 % (ref 35.0–47.0)
Hemoglobin: 11.4 g/dL — ABNORMAL LOW (ref 12.0–16.0)
Lymphocytes Relative: 38 %
Lymphs Abs: 2.7 10*3/uL (ref 1.0–3.6)
MCH: 22.1 pg — ABNORMAL LOW (ref 26.0–34.0)
MCHC: 32.3 g/dL (ref 32.0–36.0)
MCV: 68.4 fL — ABNORMAL LOW (ref 80.0–100.0)
Monocytes Absolute: 0.7 10*3/uL (ref 0.2–0.9)
Monocytes Relative: 10 %
Neutro Abs: 3.3 10*3/uL (ref 1.4–6.5)
Neutrophils Relative %: 48 %
PLATELETS: 454 10*3/uL — AB (ref 150–440)
RBC: 5.14 MIL/uL (ref 3.80–5.20)
RDW: 15.3 % — ABNORMAL HIGH (ref 11.5–14.5)
WBC: 6.9 10*3/uL (ref 3.6–11.0)

## 2016-07-05 LAB — PATHOLOGIST SMEAR REVIEW

## 2016-07-05 LAB — FERRITIN: Ferritin: 14 ng/mL (ref 11–307)

## 2016-07-05 LAB — FOLATE: Folate: 12.3 ng/mL (ref >5.9)

## 2016-07-05 LAB — SEDIMENTATION RATE: Sed Rate: 22 mm/hr — ABNORMAL HIGH (ref 0–20)

## 2016-07-05 NOTE — Progress Notes (Signed)
No complaints at this time.

## 2016-07-06 LAB — C-REACTIVE PROTEIN: CRP: 0.9 mg/dL (ref <1.0)

## 2016-07-06 LAB — VITAMIN B12: VITAMIN B 12: 375 pg/mL (ref 180–914)

## 2016-07-06 NOTE — Progress Notes (Signed)
Hematology/Oncology Consult note [facility] [phone] [phone]) [phone]  Patient Care Team: Leone Haven, MD as PCP - General (Family Medicine) Jodi Geralds, MD as Consulting Physician (Pediatrics)   Name of the patient: Amanda Snyder  [id number]  Dec 26, 1995    Reason for referral- thrombocytosis   Referring physician- Dr. Caryl Bis  Date of visit: 07/06/16   History of presenting illness- patient is a 21 year old female with a history of mental retardation who was been sent was for evaluation of thrombocytosis. She is here with her mother today and is unable to verbalize much and most of the history is obtained through her mother. Review of her CBC is as follows:  Component     Latest Ref Rng & Units 09/09/2012 08/16/2015 01/24/2016  WBC     3.6 - 11.0 K/uL 8.1 8.8 7.9  RBC     3.80 - 5.20 MIL/uL 5.33 (H) 5.26 (H) 5.28 (H)  Hemoglobin     12.0 - 16.0 g/dL 11.8 (L) 11.6 (L) 11.9 (L)  HCT     35.0 - 47.0 % 37.6 37.0 36.8  MCV     80.0 - 100.0 fL 70.5 (L) 70.2 (L) 69.7 Repeated and verified X2. (L)  MCH     26.0 - 34.0 pg 22.1 (L)    MCHC     32.0 - 36.0 g/dL 31.4 31.4 32.3  RDW     11.5 - 14.5 % 16.5 (H) 16.5 (H) 15.8 (H)  Platelets     150 - 440 K/uL 414 (H) 495.0 (H) 461.0 (H)   Component     Latest Ref Rng & Units 06/18/2016 07/05/2016  WBC     3.6 - 11.0 K/uL 8.8 6.9  RBC     3.80 - 5.20 MIL/uL 5.29 (H) 5.14  Hemoglobin     12.0 - 16.0 g/dL 11.6 (L) 11.4 (L)  HCT     35.0 - 47.0 % 36.9 35.2  MCV     80.0 - 100.0 fL 69.8 Repeated and verified X2. (L) 68.4 (L)  MCH     26.0 - 34.0 pg  22.1 (L)  MCHC     32.0 - 36.0 g/dL 31.4 32.3  RDW     11.5 - 14.5 % 15.2 15.3 (H)  Platelets     150 - 440 K/uL 489.0 (H) 454 (H)    Patient's mother says that overall patient is doing well and she has a good appetite and no unintentional weight loss. She has not had any prior episodes of blood clots. No known family  history of any blood disorders. Patient's periods are somewhat heavy but patient's mother has not noticed any blood loss in her urine or stool. Currently patient does not take any oral iron supplements.   ECOG PS- 0  Pain scale- 0   Review of systems- ROS limited due to mental retardation in patient. She verbalizes very little   Review of Systems  Constitutional: Negative for chills, fever, malaise/fatigue and weight loss.  HENT: Negative for congestion, ear discharge and nosebleeds.   Eyes: Negative for blurred vision.  Respiratory: Negative for cough, hemoptysis, sputum production, shortness of breath and wheezing.   Cardiovascular: Negative for chest pain, palpitations, orthopnea and claudication.  Gastrointestinal: Negative for abdominal pain, blood in stool, constipation, diarrhea, heartburn, melena, nausea and vomiting.  Genitourinary: Negative for dysuria, flank pain, frequency, hematuria and urgency.  Musculoskeletal: Negative for back pain, joint pain and myalgias.  Skin: Negative for rash.  Neurological: Negative for dizziness, tingling, focal weakness, seizures, weakness and headaches.  Endo/Heme/Allergies: Does not bruise/bleed easily.  Psychiatric/Behavioral: Negative for depression and suicidal ideas. The patient does not have insomnia.     Allergies  Allergen Reactions  . Clams [Shellfish Allergy]     And shrimp  . Other Other (See Comments)    Eyes and nose running    Patient Active Problem List   Diagnosis Date Noted  . Severe mental retardation 06/26/2016  . Fatty liver 01/24/2016  . Bug bites 01/24/2016  . Lipoma 01/24/2016  . History of seizures 01/24/2016  . Menorrhagia with regular cycle 08/16/2015  . Abdominal pain, right upper quadrant 08/16/2015  . Dental anomaly 08/16/2015  . Iron deficiency anemia 08/03/2014  . Vitamin D deficiency 05/20/2014  . Exotropia of left eye 03/17/2014  . Anaphylaxis due to food 11/11/2013  . Morbid obesity ([place])  11/11/2013  . PCOS (polycystic ovarian syndrome) 11/11/2013  . Elevated blood pressure reading without diagnosis of hypertension 08/25/2013  . Pre-diabetes 05/28/2013  . Incontinence 09/06/2011  . Kyphosis 08/08/2010  . Severe intellectual disabilities 12/18/2006     Past Medical History:  Diagnosis Date  . ADHD (attention deficit hyperactivity disorder) 2000   mom opted not to give medications (failed Adderall - 'zombielike', failed Strattera - 'no difference')  . Allergy    suspected allergy to pollen or dog(s)  . Anemia   . Diabetes mellitus without complication ([place])   . Hypertension   . Mental retardation 1997   Severe MR. did not walk until age 39-3 years, received Early Intervention.  . Obesity 12  . Premature birth with gestation of 35-36 weeks    [redacted] weeks GA  . Seizures ([place]) From age 7 month to 6 months   took Phenobarbital for a few years. weaned off after moving from [place] to [place]. Unknown etiology.  . Syncope 2006   witnessed by sister, no workup     Past Surgical History:  Procedure Laterality Date  . NO PAST SURGERIES      Social History   Social History  . Marital status: Single    Spouse name: N/A  . Number of children: 0  . Years of education: N/A   Occupational History  . Unemployed    Social History Main Topics  . Smoking status: Never Smoker  . Smokeless tobacco: Never Used  . Alcohol use No  . Drug use: No  . Sexual activity: Not on file   Other Topics Concern  . Not on file   Social History Narrative   Lives at home with mom and youngest sister (2 years younger).      Attends [employer] on [employer] (formerly [employer]) - will age out at 72 years. 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 Has IEP. She will transition into a day program in June.      Mother maintains guardianship (beyond 51 years of age).      Right-handed.      1-2 caffeine drinks per week.           Family History  Problem  Relation Age of Onset  . Stroke Mother 24  . Psoriasis Mother   . Alcohol abuse Mother   . Hyperlipidemia Mother   . Mental retardation Mother   . Hypertension Mother   . Alcohol abuse Brother   . Other Father     Died from gunshot wound.  . Cancer Maternal Grandmother 81  breast ca  . Breast cancer Maternal Grandmother   . Alcohol abuse Maternal Grandfather   . Diabetes Paternal Grandmother      Current Outpatient Prescriptions:  .  cetirizine (ZYRTEC) 10 MG tablet, Take 1 tablet (10 mg total) by mouth daily. (Patient taking differently: Take 10 mg by mouth as needed. ), Disp: 30 tablet, Rfl: 11 .  EPINEPHrine (EPIPEN 2-PAK) 0.3 mg/0.3 mL IJ SOAJ injection, Inject 0.3 mLs (0.3 mg total) into the muscle once as needed (anaphylaxis)., Disp: 1 Device, Rfl: 0 .  ferrous sulfate 325 (65 FE) MG tablet, Take 1 tablet (325 mg total) by mouth daily with breakfast. (Patient taking differently: Take 325 mg by mouth as needed. ), Disp: 90 tablet, Rfl: 3 .  metFORMIN (GLUCOPHAGE-XR) 500 MG 24 hr tablet, TAKE 3 TABLETS (1,500 MG TOTAL) BY MOUTH DAILY WITH BREAKFAST., Disp: 270 tablet, Rfl: 1   Physical exam:  Vitals:   07/05/16 1500  BP: 112/80  Pulse: 98  Resp: 20  Temp: 97.2 F (36.2 C)  TempSrc: Tympanic  Weight: 264 lb (119.7 kg)  Height: 5' 3.78" (1.62 m)   Physical Exam  Constitutional:  Obese and appears in no acute distress  HENT:  Head: Normocephalic and atraumatic.  Eyes: EOM are normal. Pupils are equal, round, and reactive to light.  Neck: Normal range of motion.  Cardiovascular: Normal rate, regular rhythm and normal heart sounds.   Pulmonary/Chest: Effort normal and breath sounds normal.  Abdominal: Soft. Bowel sounds are normal.  Neurological: She is alert.  Oriented to self only  Skin: Skin is warm and dry.       CMP Latest Ref Rng & Units 06/18/2016  Glucose 70 - 99 mg/dL 120(H)  BUN 6 - 23 mg/dL 8  Creatinine 0.40 - 1.20 mg/dL 0.56  Sodium 135 - 145  mEq/L 135  Potassium 3.5 - 5.1 mEq/L 3.6  Chloride 96 - 112 mEq/L 102  CO2 19 - 32 mEq/L 25  Calcium 8.4 - 10.5 mg/dL 9.5  Total Protein 6.0 - 8.3 g/dL 7.3  Total Bilirubin 0.2 - 1.2 mg/dL 0.2  Alkaline Phos 39 - 117 U/L 54  AST 0 - 37 U/L 26  ALT 0 - 35 U/L 41(H)   CBC Latest Ref Rng & Units 07/05/2016  WBC 3.6 - 11.0 K/uL 6.9  Hemoglobin 12.0 - 16.0 g/dL 11.4(L)  Hematocrit 35.0 - 47.0 % 35.2  Platelets 150 - 440 K/uL 454(H)   No images are attached to the encounter.  No results found.   Assessment and plan- Patient is a 21 y.o. female who was referred to [place] for evaluation of thrombocytosis  Patient has had a long-standing history of thrombocytosis over the last 4 years which is mild between 410- 500 and has remained more or less the same range. This could be secondary to microcytic anemia from iron deficiency. Today I will check CBC with manual differential along with ferritin and iron studies. I also note is the patient's microcytosis is disproportionate to the level of her anemia and hence I will obtain hemoglobinopathy evaluation as well. I will also obtain ESR CRP B12 and folate for further evaluation. I will see the patient back in 2 weeks' time to discuss the results with her mother. Patient's mother will start giving her oral iron starting today.   Thank you for this kind referral and the opportunity to participate in the care of this patient   Visit Diagnosis 1. Iron deficiency anemia, unspecified iron deficiency anemia  type     Dr. Randa Evens, MD, MPH [facility] Pager- [phone] 07/06/2016  3:08 PM

## 2016-07-08 ENCOUNTER — Encounter: Payer: Self-pay | Admitting: Oncology

## 2016-07-08 LAB — HEMOGLOBINOPATHY EVALUATION
HGB A2 QUANT: 2 % (ref 1.8–3.2)
HGB A: 98 % (ref 96.4–98.8)
HGB F QUANT: 0 % (ref 0.0–2.0)
HGB VARIANT: 0 %
Hgb C: 0 %
Hgb S Quant: 0 %

## 2016-07-19 ENCOUNTER — Ambulatory Visit: Payer: Medicare Other | Admitting: Oncology

## 2016-07-23 ENCOUNTER — Inpatient Hospital Stay: Payer: Medicare Other | Admitting: Oncology

## 2016-08-14 ENCOUNTER — Other Ambulatory Visit: Payer: Self-pay

## 2016-08-14 MED ORDER — METFORMIN HCL ER 500 MG PO TB24: ORAL_TABLET | ORAL | 1 refills | Status: DC

## 2016-08-20 ENCOUNTER — Inpatient Hospital Stay: Payer: Medicare Other | Admitting: Oncology

## 2016-09-10 ENCOUNTER — Inpatient Hospital Stay: Payer: Medicare Other | Admitting: Oncology

## 2016-10-01 ENCOUNTER — Inpatient Hospital Stay: Payer: Medicare Other | Admitting: Oncology

## 2016-11-13 ENCOUNTER — Telehealth: Payer: Self-pay | Admitting: Family Medicine

## 2016-11-13 NOTE — Telephone Encounter (Signed)
Left pt message asking to call Ebony Hail back directly at [phone] to confirm that extending 12/19/16 appt to include AWV. Thanks!  *NOTE* Never had AWV before/Started Medicare in 2015

## 2016-12-18 NOTE — Telephone Encounter (Signed)
Left pt mother message asking to call Amanda Snyder back directly at [phone] to confirm that extending 12/19/16 appt to include AWV. Thanks!  *NOTE* Never had AWV before/Started Medicare in 2015

## 2016-12-18 NOTE — Telephone Encounter (Signed)
Pt mother confirmed per Cathleen Corti.

## 2016-12-19 ENCOUNTER — Encounter: Payer: Self-pay | Admitting: Family Medicine

## 2016-12-19 ENCOUNTER — Ambulatory Visit (INDEPENDENT_AMBULATORY_CARE_PROVIDER_SITE_OTHER): Payer: Medicare Other | Admitting: Family Medicine

## 2016-12-19 VITALS — BP 130/90 | HR 112 | Temp 98.7°F | Wt 287.8 lb

## 2016-12-19 DIAGNOSIS — D509 Iron deficiency anemia, unspecified: Secondary | ICD-10-CM

## 2016-12-19 DIAGNOSIS — R7303 Prediabetes: Secondary | ICD-10-CM

## 2016-12-19 DIAGNOSIS — N926 Irregular menstruation, unspecified: Secondary | ICD-10-CM

## 2016-12-19 DIAGNOSIS — R000 Tachycardia, unspecified: Secondary | ICD-10-CM | POA: Diagnosis not present

## 2016-12-19 NOTE — Assessment & Plan Note (Signed)
Previously seen by nutrition. Discussed remaining active and discussed dietary changes. Given dietary information. Advised that her weight gain with increased fat content in her body could be contributing to her irregular menses.

## 2016-12-19 NOTE — Patient Instructions (Addendum)
Nice to see you. Please work on dietary changes. Please look at the information given below. Please try to stay active. Please watch her menstrual cycle. I would suggest seeing gynecology as well for yearly checkup.  Diet Recommendations  Starchy (carb) foods: Bread, rice, pasta, potatoes, corn, cereal, grits, crackers, bagels, muffins, all baked goods.  (Fruits, milk, and yogurt also have carbohydrate, but most of these foods will not spike your blood sugar as the starchy foods will.)  A few fruits do cause high blood sugars; use small portions of bananas (limit to 1/2 at a time), grapes, watermelon, oranges, and most tropical fruits.    Protein foods: Meat, fish, poultry, eggs, dairy foods, and beans such as pinto and kidney beans (beans also provide carbohydrate).   1. Eat at least 3 meals and 1-2 snacks per day. Never go more than 4-5 hours while awake without eating. Eat breakfast within the first hour of getting up.   2. Limit starchy foods to TWO per meal and ONE per snack. ONE portion of a starchy  food is equal to the following:   - ONE slice of bread (or its equivalent, such as half of a hamburger bun).   - 1/2 cup of a "scoopable" starchy food such as potatoes or rice.   - 15 grams of carbohydrate as shown on food label.  3. Include at every meal: a protein food, a carb food, and vegetables and/or fruit.   - Obtain twice the volume of veg's as protein or carbohydrate foods for both lunch and dinner.   - Fresh or frozen veg's are best.   - Keep frozen veg's on hand for a quick vegetable serving.

## 2016-12-19 NOTE — Assessment & Plan Note (Signed)
Sinus tachycardia on exam. Has been elevated in the past. Suspect related to anxiety and activity that she has throughout the visit as she is constantly moving. We'll obtain lab work as outlined below to evaluate for other causes. Periodically recheck check this.

## 2016-12-19 NOTE — Assessment & Plan Note (Signed)
Typically regular though recently was a week or so late for her menstrual cycle. Currently finishing up on her menstrual cycle. Discussed that her morbid obesity could be contributing to this. Discussed pregnancy test though mother deferred and I thought it was reasonable given that she is on her menstrual cycle currently. Encouraged them to see her gynecologist.

## 2016-12-19 NOTE — Progress Notes (Signed)
[name], MD Phone: [phone]  Amanda Snyder is a 21 y.o. female who presents today for follow-up.  Patient's mother notes her weight is up. She spent a month at home and not going to her day program and was not as active. Her mom was also feeding her 4 meals a day with snacks as well. She started to become more active. She does try to do portion control. They did see a nutritionist and have decrease fatty food intake.  The patient's mother notes the patient's menstrual cycle was late this month though started this week and is finishing up. She typically has a menstrual cycle once a month lasting for 2-3 days. No recent missed periods. She is not sexually active. She does follow with gynecology though has not seen them in the past year.  Heart rate has been elevated in the past. Patient's mother does note she does get somewhat anxious and worked up at times. She does not complain of any cardiac symptoms. No palpitations.  PMH: nonsmoker.   ROS see history of present illness  Objective  Physical Exam Vitals:   12/19/16 1048 12/19/16 1126  BP: 130/90   Pulse: (!) 122 (!) 112  Temp: 98.7 F (37.1 C)   SpO2: 98%     BP Readings from Last 3 Encounters:  12/19/16 130/90  07/05/16 112/80  06/26/16 (!) 136/94   Wt Readings from Last 3 Encounters:  12/19/16 287 lb 12.8 oz (130.5 kg)  07/05/16 264 lb (119.7 kg)  06/26/16 266 lb (120.7 kg)    Physical Exam  Constitutional: No distress.  Cardiovascular: Regular rhythm and normal heart sounds.  Tachycardia present.   Pulmonary/Chest: Effort normal and breath sounds normal.  Musculoskeletal: She exhibits no edema.  Neurological: She is alert.  Skin: Skin is warm and dry. She is not diaphoretic.  Psychiatric:  Occasional verbal outbursts, does follow directions     Assessment/Plan: Please see individual problem list.  Morbid obesity Previously seen by nutrition. Discussed remaining active and discussed dietary  changes. Given dietary information. Advised that her weight gain with increased fat content in her body could be contributing to her irregular menses.  Irregular menses Typically regular though recently was a week or so late for her menstrual cycle. Currently finishing up on her menstrual cycle. Discussed that her morbid obesity could be contributing to this. Discussed pregnancy test though mother deferred and I thought it was reasonable given that she is on her menstrual cycle currently. Encouraged them to see her gynecologist.  Tachycardia Sinus tachycardia on exam. Has been elevated in the past. Suspect related to anxiety and activity that she has throughout the visit as she is constantly moving. We'll obtain lab work as outlined below to evaluate for other causes. Periodically recheck check this.   Orders Placed This Encounter  Procedures  . Iron, TIBC and Ferritin Panel    Standing Status:   Future    Standing Expiration Date:   12/19/2017  . CBC    Standing Status:   Future    Standing Expiration Date:   12/19/2017  . Comp Met (CMET)    Standing Status:   Future    Standing Expiration Date:   12/19/2017  . TSH    Standing Status:   Future    Standing Expiration Date:   12/19/2017  . Hemoglobin A1c    Standing Status:   Future    Standing Expiration Date:   12/19/2017  . Lipid panel    Standing Status:  Future    Standing Expiration Date:   12/19/2017   Eric Sonnenberg, MD [facility] [place]  

## 2017-01-01 ENCOUNTER — Other Ambulatory Visit (INDEPENDENT_AMBULATORY_CARE_PROVIDER_SITE_OTHER): Payer: Medicare Other

## 2017-01-01 DIAGNOSIS — D509 Iron deficiency anemia, unspecified: Secondary | ICD-10-CM | POA: Diagnosis not present

## 2017-01-01 DIAGNOSIS — R000 Tachycardia, unspecified: Secondary | ICD-10-CM

## 2017-01-01 DIAGNOSIS — R7303 Prediabetes: Secondary | ICD-10-CM

## 2017-01-01 NOTE — Addendum Note (Signed)
Addended by: Arby Barrette on: 01/01/2017 07:22 AM   Modules accepted: Orders

## 2017-01-02 LAB — IRON,TIBC AND FERRITIN PANEL
%SAT: 21 % (calc) (ref 11–50)
FERRITIN: 29 ng/mL (ref 10–154)
Iron: 64 ug/dL (ref 40–190)
TIBC: 301 mcg/dL (calc) (ref 250–450)

## 2017-01-02 LAB — COMPREHENSIVE METABOLIC PANEL
AG RATIO: 1.2 (calc) (ref 1.0–2.5)
ALT: 64 U/L — AB (ref 6–29)
AST: 38 U/L — ABNORMAL HIGH (ref 10–30)
Albumin: 3.8 g/dL (ref 3.6–5.1)
Alkaline phosphatase (APISO): 63 U/L (ref 33–115)
BILIRUBIN TOTAL: 0.3 mg/dL (ref 0.2–1.2)
BUN / CREAT RATIO: 11 (calc) (ref 6–22)
BUN: 6 mg/dL — ABNORMAL LOW (ref 7–25)
CALCIUM: 9 mg/dL (ref 8.6–10.2)
CHLORIDE: 103 mmol/L (ref 98–110)
CO2: 23 mmol/L (ref 20–32)
Creat: 0.56 mg/dL (ref 0.50–1.10)
GLOBULIN: 3.1 g/dL (ref 1.9–3.7)
GLUCOSE: 84 mg/dL (ref 65–99)
Potassium: 4 mmol/L (ref 3.5–5.3)
SODIUM: 136 mmol/L (ref 135–146)
TOTAL PROTEIN: 6.9 g/dL (ref 6.1–8.1)

## 2017-01-02 LAB — LIPID PANEL
CHOL/HDL RATIO: 4.5 (calc) (ref <5.0)
Cholesterol: 147 mg/dL (ref <200)
HDL: 33 mg/dL — ABNORMAL LOW (ref >50)
LDL Cholesterol (Calc): 86 mg/dL (calc)
NON-HDL CHOLESTEROL (CALC): 114 mg/dL (ref <130)
Triglycerides: 181 mg/dL — ABNORMAL HIGH (ref <150)

## 2017-01-02 LAB — CBC
HEMATOCRIT: 39.5 % (ref 35.0–45.0)
HEMOGLOBIN: 12.3 g/dL (ref 11.7–15.5)
MCH: 22.4 pg — AB (ref 27.0–33.0)
MCHC: 31.1 g/dL — ABNORMAL LOW (ref 32.0–36.0)
MCV: 72.1 fL — ABNORMAL LOW (ref 80.0–100.0)
MPV: 9.3 fL (ref 7.5–12.5)
Platelets: 476 10*3/uL — ABNORMAL HIGH (ref 140–400)
RBC: 5.48 10*6/uL — AB (ref 3.80–5.10)
RDW: 14.5 % (ref 11.0–15.0)
WBC: 8.4 10*3/uL (ref 3.8–10.8)

## 2017-01-02 LAB — TSH: TSH: 2.03 mIU/L

## 2017-01-02 LAB — HEMOGLOBIN A1C
HEMOGLOBIN A1C: 6.1 %{Hb} — AB (ref <5.7)
MEAN PLASMA GLUCOSE: 128 (calc)
eAG (mmol/L): 7.1 (calc)

## 2017-03-01 ENCOUNTER — Other Ambulatory Visit: Payer: Self-pay | Admitting: Family Medicine

## 2017-06-20 ENCOUNTER — Encounter: Payer: Self-pay | Admitting: Family Medicine

## 2017-06-20 ENCOUNTER — Ambulatory Visit (INDEPENDENT_AMBULATORY_CARE_PROVIDER_SITE_OTHER): Payer: Medicare Other | Admitting: Family Medicine

## 2017-06-20 VITALS — BP 128/78 | HR 102 | Temp 98.1°F | Resp 18 | Ht 64.0 in | Wt 302.4 lb

## 2017-06-20 DIAGNOSIS — R7303 Prediabetes: Secondary | ICD-10-CM

## 2017-06-20 DIAGNOSIS — F72 Severe intellectual disabilities: Secondary | ICD-10-CM

## 2017-06-20 DIAGNOSIS — R635 Abnormal weight gain: Secondary | ICD-10-CM | POA: Diagnosis not present

## 2017-06-20 DIAGNOSIS — K760 Fatty (change of) liver, not elsewhere classified: Secondary | ICD-10-CM

## 2017-06-20 DIAGNOSIS — D509 Iron deficiency anemia, unspecified: Secondary | ICD-10-CM

## 2017-06-20 NOTE — Patient Instructions (Signed)
Nice to see you. I am glad she is doing well.  We will check lab work today and contact you with the results.

## 2017-06-20 NOTE — Progress Notes (Signed)
 , MD Phone: [phone]  Amanda Snyder is a 22 y.o. female who presents today for f/u.  Patient's mother reports she feels like she is doing much better than prior.  She is in a new day program and has responded to this extremely well.  She has less outbursts.  She is communicating a little bit better.  She is prompting things that she did not prompt previously.  She does have a better home health worker as well.  Feels like patient is doing quite a bit better with these new transitions.  She does note that the patient did not like the prior day program nearly as much and has done quite a bit better at the new one.  She did on one occasion come home from the prior day program with scraped knees and her mom was advised that she had fallen.  She reports it has been sometime since the patient has reported the right upper quadrant discomfort that was described as something is biting her.  She notes normal bowel movements.  No blood in her stool.  No nausea or vomiting.  Ultrasound did reveal fatty liver.  LFTs have been mildly elevated previously.  They did see hematology for her anemia.  She was on iron supplementation though stopped this.  Reports regular monthly menstrual cycles lasting for about 4 days.  Her mom is most concerned about her weight which has continued to go up.  She thinks some of this is related to her getting food at the day program that she is not supposed to be getting.  They have seen a nutritionist previously.  The patient's mother is going to crack down on her food intake.  Social History   Tobacco Use  Smoking Status Never Smoker  Smokeless Tobacco Never Used     ROS see history of present illness  Objective  Physical Exam Vitals:   06/20/17 1523  BP: 128/78  Pulse: (!) 102  Resp: 18  Temp: 98.1 F (36.7 C)  SpO2: 97%    BP Readings from Last 3 Encounters:  06/20/17 128/78  12/19/16 130/90  07/05/16 112/80   Wt Readings from Last 3  Encounters:  06/20/17 (!) 302 lb 6 oz (137.2 kg)  12/19/16 287 lb 12.8 oz (130.5 kg)  07/05/16 264 lb (119.7 kg)    Physical Exam  Constitutional: No distress.  Cardiovascular: Regular rhythm and normal heart sounds. Tachycardia present.  Pulmonary/Chest: Effort normal and breath sounds normal.  Abdominal: Soft. Bowel sounds are normal. She exhibits no distension. There is no tenderness. There is no rebound and no guarding.  Exam done seated at patient request  Musculoskeletal: She exhibits no edema.  Neurological: She is alert. Gait normal.  Skin: Skin is warm and dry. She is not diaphoretic.     Assessment/Plan: Please see individual problem list.  Severe intellectual disabilities Seems to be doing fairly well at this time.  Encouraged continued use of her current day program and home health worker.  Fatty liver No recurrence of right upper quadrant discomfort recently.  We will check LFTs.  Morbid obesity Weight has continued to go up.  Recent TSH was normal.  After the patient left I did some research and to evaluate for Cushing's given her progressive increase in weight and glucose intolerance it may be worthwhile testing a cortisol level.  We will contact the patient's mother to see if she would like to do this.  Urged to monitor her dietary intake.  Iron deficiency   anemia Deficient in the past though workup through hematology appears to have not revealed any iron deficiency at that time.  Possibly with alpha thalassemia trait given her microcytosis based on pathology smear review.  We will recheck CBC and iron studies.  Consider referral back to hematology in the future.   Orders Placed This Encounter  Procedures  . CBC w/Diff  . Iron, TIBC and Ferritin Panel  . Comp Met (CMET)  . HgB A1c  . TSH    No orders of the defined types were placed in this encounter.     , MD [facility] [place]  

## 2017-06-20 NOTE — Progress Notes (Signed)
Pre-visit discussion using our clinic review tool. No additional management support is needed unless otherwise documented below in the visit note.  

## 2017-06-21 ENCOUNTER — Encounter: Payer: Self-pay | Admitting: Family Medicine

## 2017-06-21 LAB — COMPREHENSIVE METABOLIC PANEL
AG RATIO: 1.3 (calc) (ref 1.0–2.5)
ALT: 66 U/L — AB (ref 6–29)
AST: 51 U/L — AB (ref 10–30)
Albumin: 3.9 g/dL (ref 3.6–5.1)
Alkaline phosphatase (APISO): 71 U/L (ref 33–115)
BUN: 7 mg/dL (ref 7–25)
CALCIUM: 9.3 mg/dL (ref 8.6–10.2)
CO2: 22 mmol/L (ref 20–32)
Chloride: 101 mmol/L (ref 98–110)
Creat: 0.63 mg/dL (ref 0.50–1.10)
Globulin: 3 g/dL (calc) (ref 1.9–3.7)
Glucose, Bld: 185 mg/dL — ABNORMAL HIGH (ref 65–99)
Potassium: 4.2 mmol/L (ref 3.5–5.3)
Sodium: 136 mmol/L (ref 135–146)
Total Bilirubin: 0.2 mg/dL (ref 0.2–1.2)
Total Protein: 6.9 g/dL (ref 6.1–8.1)

## 2017-06-21 LAB — CBC WITH DIFFERENTIAL/PLATELET
BASOS PCT: 0.3 %
Basophils Absolute: 28 cells/uL (ref 0–200)
EOS PCT: 2.9 %
Eosinophils Absolute: 267 cells/uL (ref 15–500)
HCT: 37.1 % (ref 35.0–45.0)
Hemoglobin: 11.8 g/dL (ref 11.7–15.5)
Lymphs Abs: 3294 cells/uL (ref 850–3900)
MCH: 22.5 pg — ABNORMAL LOW (ref 27.0–33.0)
MCHC: 31.8 g/dL — AB (ref 32.0–36.0)
MCV: 70.8 fL — ABNORMAL LOW (ref 80.0–100.0)
MONOS PCT: 6.3 %
MPV: 9.7 fL (ref 7.5–12.5)
Neutro Abs: 5032 cells/uL (ref 1500–7800)
Neutrophils Relative %: 54.7 %
Platelets: 452 10*3/uL — ABNORMAL HIGH (ref 140–400)
RBC: 5.24 10*6/uL — AB (ref 3.80–5.10)
RDW: 14.6 % (ref 11.0–15.0)
Total Lymphocyte: 35.8 %
WBC mixed population: 580 cells/uL (ref 200–950)
WBC: 9.2 10*3/uL (ref 3.8–10.8)

## 2017-06-21 LAB — HEMOGLOBIN A1C
EAG (MMOL/L): 8.4 (calc)
Hgb A1c MFr Bld: 6.9 % of total Hgb — ABNORMAL HIGH (ref <5.7)
MEAN PLASMA GLUCOSE: 151 (calc)

## 2017-06-21 LAB — IRON,TIBC AND FERRITIN PANEL
%SAT: 22 % (calc) (ref 11–50)
FERRITIN: 40 ng/mL (ref 10–154)
IRON: 66 ug/dL (ref 40–190)
TIBC: 306 ug/dL (ref 250–450)

## 2017-06-21 LAB — TSH: TSH: 2.35 mIU/L

## 2017-06-21 NOTE — Assessment & Plan Note (Signed)
No recurrence of right upper quadrant discomfort recently.  We will check LFTs.

## 2017-06-21 NOTE — Assessment & Plan Note (Signed)
Deficient in the past though workup through hematology appears to have not revealed any iron deficiency at that time.  Possibly with alpha thalassemia trait given her microcytosis based on pathology smear review.  We will recheck CBC and iron studies.  Consider referral back to hematology in the future.

## 2017-06-21 NOTE — Assessment & Plan Note (Addendum)
Weight has continued to go up.  Recent TSH was normal.  After the patient left I did some research and to evaluate for Cushing's given her progressive increase in weight and glucose intolerance it may be worthwhile testing a cortisol level.  We will contact the patient's mother to see if she would like to do this.  Urged to monitor her dietary intake.

## 2017-06-21 NOTE — Assessment & Plan Note (Signed)
Seems to be doing fairly well at this time.  Encouraged continued use of her current day program and home health worker.

## 2017-06-23 NOTE — Progress Notes (Signed)
See result note.  

## 2017-06-28 ENCOUNTER — Other Ambulatory Visit: Payer: Self-pay | Admitting: Family Medicine

## 2017-06-28 DIAGNOSIS — R635 Abnormal weight gain: Secondary | ICD-10-CM

## 2017-06-28 MED ORDER — DEXAMETHASONE 1 MG PO TABS: 1.0000 mg | ORAL_TABLET | Freq: Once | ORAL | 0 refills | Status: AC

## 2017-07-08 ENCOUNTER — Other Ambulatory Visit (INDEPENDENT_AMBULATORY_CARE_PROVIDER_SITE_OTHER): Payer: Medicare Other

## 2017-07-08 DIAGNOSIS — R635 Abnormal weight gain: Secondary | ICD-10-CM

## 2017-07-08 LAB — CORTISOL: CORTISOL PLASMA: 0.5 ug/dL

## 2017-08-06 ENCOUNTER — Telehealth: Payer: Self-pay | Admitting: Family Medicine

## 2017-08-06 NOTE — Telephone Encounter (Signed)
It should be the same medication and she should be ok to take the prescription as is. Thanks.

## 2017-08-06 NOTE — Telephone Encounter (Signed)
Pt's mother is concerned about the medication that was prescribed to her daughter. She had the metFORMIN (GLUCOPHAGE-XR) 500 MG 24 hr tablet refilled. Her daughters medication bottle before refill said Metformin ER 500 mg tablet, the new bottle she just had refilled at [facility] says, Metformin HCL ER 500 mg tablet. She wants to make sure this is correct. She doesn't want to give to her daughter unless Dr. Caryl Bis changed prescribtion. Please call mother at [phone].

## 2017-08-06 NOTE — Telephone Encounter (Signed)
Patient's mother notified.

## 2017-08-06 NOTE — Telephone Encounter (Signed)
Please advise 

## 2017-09-03 ENCOUNTER — Other Ambulatory Visit: Payer: Self-pay | Admitting: Family Medicine

## 2017-09-24 ENCOUNTER — Encounter: Payer: Self-pay | Admitting: Family Medicine

## 2017-09-24 ENCOUNTER — Ambulatory Visit (INDEPENDENT_AMBULATORY_CARE_PROVIDER_SITE_OTHER): Payer: Medicare Other | Admitting: Family Medicine

## 2017-09-24 VITALS — BP 124/90 | HR 108 | Temp 98.8°F | Ht 64.0 in | Wt 294.0 lb

## 2017-09-24 DIAGNOSIS — R109 Unspecified abdominal pain: Secondary | ICD-10-CM

## 2017-09-24 DIAGNOSIS — N926 Irregular menstruation, unspecified: Secondary | ICD-10-CM

## 2017-09-24 DIAGNOSIS — T7800XD Anaphylactic reaction due to unspecified food, subsequent encounter: Secondary | ICD-10-CM

## 2017-09-24 DIAGNOSIS — L219 Seborrheic dermatitis, unspecified: Secondary | ICD-10-CM | POA: Insufficient documentation

## 2017-09-24 DIAGNOSIS — M79671 Pain in right foot: Secondary | ICD-10-CM | POA: Diagnosis not present

## 2017-09-24 DIAGNOSIS — E119 Type 2 diabetes mellitus without complications: Secondary | ICD-10-CM | POA: Diagnosis not present

## 2017-09-24 DIAGNOSIS — T782XXD Anaphylactic shock, unspecified, subsequent encounter: Secondary | ICD-10-CM | POA: Diagnosis not present

## 2017-09-24 MED ORDER — EPINEPHRINE 0.3 MG/0.3ML IJ SOAJ: 0.3000 mg | Freq: Once | INTRAMUSCULAR | 0 refills | Status: DC | PRN

## 2017-09-24 MED ORDER — KETOCONAZOLE 2 % EX SHAM: 1.0000 "application " | MEDICATED_SHAMPOO | CUTANEOUS | 0 refills | Status: DC

## 2017-09-24 NOTE — Assessment & Plan Note (Signed)
She has not had to use an EpiPen.  Her current EpiPen is out of date.  Refill given.

## 2017-09-24 NOTE — Progress Notes (Signed)
[name], MD Phone: [phone]  Amanda Snyder is a 22 y.o. female who presents today for f/u.  CC: diabetes, right foot and ankle pain, dry scalp, abdominal pain  Patient's mother provides a history.  She reports about a week ago the patient walked into an air compressor and subsequently had difficulty walking related to discomfort in her right foot and ankle.  She was unable to walk immediately after the injury though progressively improved throughout the day.  Complains of her right ankle and foot hurting at times.  She is able to walk on it now with no difficulty.  Her mother reports dry flaky scalp as well.  Notes this has been going on for a long time.  She will intermittently have to scratch it.  She has been using baby shampoo.  Her weight has trended down.  They have worked aggressively with diet changes.  They are also walking at the gym.  Patient's mother reports this morning she threw everything up.  She has intermittently had some diarrhea possibly related to metformin.  There is no coffee-ground emesis or blood in her emesis or blood in her stool.  She has intermittently complained of some abdominal discomfort which has been chronic.  Her mother notes the patient may be a week or so late for her menstrual cycle.  Social History   Tobacco Use  Smoking Status Never Smoker  Smokeless Tobacco Never Used     ROS see history of present illness  Objective  Physical Exam Vitals:   09/24/17 1528  BP: 124/90  Pulse: (!) 108  Temp: 98.8 F (37.1 C)  SpO2: 96%    BP Readings from Last 3 Encounters:  09/24/17 124/90  06/20/17 128/78  12/19/16 130/90   Wt Readings from Last 3 Encounters:  09/24/17 294 lb (133.4 kg)  06/20/17 (!) 302 lb 6 oz (137.2 kg)  12/19/16 287 lb 12.8 oz (130.5 kg)    Physical Exam  Constitutional: No distress.  HENT:  Patches of dry flaky scalp in the anterior portion of her scalp  Cardiovascular: Normal rate, regular rhythm and  normal heart sounds.  Pulmonary/Chest: Effort normal and breath sounds normal.  Abdominal: Soft. Bowel sounds are normal. She exhibits no distension. There is no tenderness. There is no rebound and no guarding.  Musculoskeletal: She exhibits no edema.  Right ankle with no tenderness over either malleolus, no fifth head of metatarsal or metatarsal tenderness, no navicular tenderness, minimal ankle swelling in the right ankle, 2+ DP and PT pulse right foot  Neurological: She is alert.  Skin: Skin is warm and dry. She is not diaphoretic.     Assessment/Plan: Please see individual problem list.  Diabetes ([place]) Plan to check lab work tomorrow.  Continue metformin.  Anaphylaxis due to food She has not had to use an EpiPen.  Her current EpiPen is out of date.  Refill given.  Morbid obesity Discussed continued diet and exercise.  Abdominal discomfort Vague abdominal discomfort.  She does have chronic right upper quadrant discomfort intermittently.  Benign abdominal exam today.  Could be related to gastroenteritis given her vomiting this morning as well as intermittent diarrhea.  The diarrhea could be related to the metformin.  We will check lab work.  Given that she has potentially been late for her cycle we will check a beta hCG as she was unable to provide urine today.  She was not cooperative with labs today and thus we will try to get them tomorrow at our  [place] office.  Seborrheic dermatitis of scalp Trial of ketoconazole shampoo.  Right foot pain Improving though given her inability to walk after the injury I think she needs an x-ray of her right foot and ankle.  I would like to get the pregnancy test back first and then she can have it done at [facility].  Once the labs return I will place the order.   Orders Placed This Encounter  Procedures  . CBC    Standing Status:   Future    Standing Expiration Date:   09/25/2018  . Hemoglobin A1c    Standing Status:   Future     Standing Expiration Date:   09/25/2018  . Comp Met (CMET)    Standing Status:   Future    Standing Expiration Date:   09/25/2018  . hCG, serum, qualitative    Standing Status:   Future    Standing Expiration Date:   09/25/2018    Meds ordered this encounter  Medications  . EPINEPHrine (EPIPEN 2-PAK) 0.3 mg/0.3 mL IJ SOAJ injection    Sig: Inject 0.3 mLs (0.3 mg total) into the muscle once as needed (anaphylaxis).    Dispense:  1 Device    Refill:  0  . ketoconazole (NIZORAL) 2 % shampoo    Sig: Apply 1 application topically 2 (two) times a week. For 2-4 weeks    Dispense:  120 mL    Refill:  0     [name], MD [place]

## 2017-09-24 NOTE — Assessment & Plan Note (Signed)
Discussed continued diet and exercise.

## 2017-09-24 NOTE — Assessment & Plan Note (Signed)
Vague abdominal discomfort.  She does have chronic right upper quadrant discomfort intermittently.  Benign abdominal exam today.  Could be related to gastroenteritis given her vomiting this morning as well as intermittent diarrhea.  The diarrhea could be related to the metformin.  We will check lab work.  Given that she has potentially been late for her cycle we will check a beta hCG as she was unable to provide urine today.  She was not cooperative with labs today and thus we will try to get them tomorrow at our [place] office.

## 2017-09-24 NOTE — Patient Instructions (Addendum)
Nice to see you. Please continue to work on diet and exercise. Please monitor for further vomiting and diarrhea. We will check lab work and contact you with the results. We will get an x-ray of her right ankle and foot at [facility] in [place]. I want to see the lab results first.  Please try the ketoconazole shampoo for her scalp.

## 2017-09-24 NOTE — Assessment & Plan Note (Signed)
Plan to check lab work tomorrow.  Continue metformin.

## 2017-09-24 NOTE — Assessment & Plan Note (Signed)
Improving though given her inability to walk after the injury I think she needs an x-ray of her right foot and ankle.  I would like to get the pregnancy test back first and then she can have it done at [facility].  Once the labs return I will place the order.

## 2017-09-24 NOTE — Assessment & Plan Note (Signed)
Trial of ketoconazole shampoo.

## 2017-09-25 ENCOUNTER — Other Ambulatory Visit (INDEPENDENT_AMBULATORY_CARE_PROVIDER_SITE_OTHER): Payer: Medicare Other

## 2017-09-25 DIAGNOSIS — R109 Unspecified abdominal pain: Secondary | ICD-10-CM | POA: Diagnosis not present

## 2017-09-25 DIAGNOSIS — E119 Type 2 diabetes mellitus without complications: Secondary | ICD-10-CM

## 2017-09-25 DIAGNOSIS — N926 Irregular menstruation, unspecified: Secondary | ICD-10-CM | POA: Diagnosis not present

## 2017-09-25 LAB — CBC
HEMATOCRIT: 35.9 % — AB (ref 36.0–46.0)
Hemoglobin: 11.4 g/dL — ABNORMAL LOW (ref 12.0–15.0)
MCHC: 31.7 g/dL (ref 30.0–36.0)
MCV: 70.5 fl — ABNORMAL LOW (ref 78.0–100.0)
Platelets: 421 10*3/uL — ABNORMAL HIGH (ref 150.0–400.0)
RBC: 5.1 Mil/uL (ref 3.87–5.11)
RDW: 15 % (ref 11.5–15.5)
WBC: 9.3 10*3/uL (ref 4.0–10.5)

## 2017-09-25 LAB — COMPREHENSIVE METABOLIC PANEL
ALBUMIN: 3.9 g/dL (ref 3.5–5.2)
ALT: 55 U/L — AB (ref 0–35)
AST: 45 U/L — AB (ref 0–37)
Alkaline Phosphatase: 68 U/L (ref 39–117)
BILIRUBIN TOTAL: 0.3 mg/dL (ref 0.2–1.2)
BUN: 6 mg/dL (ref 6–23)
CO2: 25 meq/L (ref 19–32)
CREATININE: 0.54 mg/dL (ref 0.40–1.20)
Calcium: 9.2 mg/dL (ref 8.4–10.5)
Chloride: 101 mEq/L (ref 96–112)
GFR: 177.98 mL/min (ref >60.00)
Glucose, Bld: 149 mg/dL — ABNORMAL HIGH (ref 70–99)
Potassium: 3.8 mEq/L (ref 3.5–5.1)
Sodium: 137 mEq/L (ref 135–145)
Total Protein: 7.4 g/dL (ref 6.0–8.3)

## 2017-09-25 LAB — HEMOGLOBIN A1C: HEMOGLOBIN A1C: 7.6 % — AB (ref 4.6–6.5)

## 2017-09-26 ENCOUNTER — Other Ambulatory Visit: Payer: Self-pay | Admitting: Family Medicine

## 2017-09-26 DIAGNOSIS — M79671 Pain in right foot: Secondary | ICD-10-CM

## 2017-09-26 DIAGNOSIS — M25571 Pain in right ankle and joints of right foot: Secondary | ICD-10-CM

## 2017-09-26 DIAGNOSIS — R7989 Other specified abnormal findings of blood chemistry: Secondary | ICD-10-CM

## 2017-09-26 DIAGNOSIS — R945 Abnormal results of liver function studies: Principal | ICD-10-CM

## 2017-09-26 LAB — HCG, SERUM, QUALITATIVE: PREG SERUM: NEGATIVE

## 2017-09-26 MED ORDER — SITAGLIPTIN PHOSPHATE 100 MG PO TABS: 100.0000 mg | ORAL_TABLET | Freq: Every day | ORAL | 3 refills | Status: DC

## 2017-10-02 ENCOUNTER — Encounter: Payer: Self-pay | Admitting: Family Medicine

## 2017-10-06 ENCOUNTER — Other Ambulatory Visit: Payer: Self-pay | Admitting: Family Medicine

## 2017-10-06 ENCOUNTER — Telehealth: Payer: Self-pay | Admitting: Family Medicine

## 2017-10-06 ENCOUNTER — Ambulatory Visit
Admission: RE | Admit: 2017-10-06 | Discharge: 2017-10-06 | Disposition: A | Discharge status: Home / Self Care | Payer: Medicare Other | Source: Ambulatory Visit | Attending: Family Medicine | Admitting: Family Medicine

## 2017-10-06 DIAGNOSIS — M25571 Pain in right ankle and joints of right foot: Secondary | ICD-10-CM | POA: Diagnosis not present

## 2017-10-06 DIAGNOSIS — M79671 Pain in right foot: Secondary | ICD-10-CM

## 2017-10-06 DIAGNOSIS — S99911A Unspecified injury of right ankle, initial encounter: Secondary | ICD-10-CM | POA: Diagnosis not present

## 2017-10-06 DIAGNOSIS — M7989 Other specified soft tissue disorders: Secondary | ICD-10-CM | POA: Diagnosis not present

## 2017-10-06 DIAGNOSIS — S92414A Nondisplaced fracture of proximal phalanx of right great toe, initial encounter for closed fracture: Secondary | ICD-10-CM | POA: Insufficient documentation

## 2017-10-06 DIAGNOSIS — S62646A Nondisplaced fracture of proximal phalanx of right little finger, initial encounter for closed fracture: Secondary | ICD-10-CM | POA: Diagnosis not present

## 2017-10-06 HISTORY — DX: Nondisplaced fracture of proximal phalanx of right great toe, initial encounter for closed fracture: S92.414A

## 2017-10-06 NOTE — Telephone Encounter (Signed)
Please advise 

## 2017-10-06 NOTE — Telephone Encounter (Signed)
Please let the patient's mother know that it appears that she has a broken bone in her big toe and her little toe in her right foot.  I would suggest we have her see a podiatrist for this.  She can also buddy tape the first and second toes and the fourth and fifth toes as that should help immobilize them and we could provide a prescription for hard soled postop shoe.  If they are okay with the referral I can place this.  Thanks.

## 2017-10-06 NOTE — Telephone Encounter (Signed)
faxed

## 2017-10-06 NOTE — Telephone Encounter (Signed)
Patient's mother called back and would like the referral to be put in. She also would like the prescription to be put in for the hard soled postop shoe. Call back and let her know where that is being sent. [phone]

## 2017-10-06 NOTE — Telephone Encounter (Signed)
Left message to return call, ok for pec to speak to patients mother about message below

## 2017-10-06 NOTE — Telephone Encounter (Signed)
FYI

## 2017-10-06 NOTE — Addendum Note (Signed)
Addended by: Caryl Bis, Hafsah Hendler G on: 10/06/2017 12:52 PM   Modules accepted: Orders

## 2017-10-06 NOTE — Telephone Encounter (Signed)
I am unable to locate a pharmacy in [place] that carry these.

## 2017-10-06 NOTE — Telephone Encounter (Signed)
Referral placed.  The postop shoe would have to be a written prescription.  Would they be able to pick this up?  If not we would need to determine where they can get this from in [place] so that they do not have to drive all the way here and then we could fax it.

## 2017-10-06 NOTE — Telephone Encounter (Signed)
[facility] in [place] carries these. rx placed on your desk

## 2017-10-06 NOTE — Telephone Encounter (Signed)
fyi

## 2017-10-06 NOTE — Telephone Encounter (Signed)
Prescription completed.

## 2017-10-06 NOTE — Telephone Encounter (Signed)
Received call report from [facility] with pt's foot xray results. Reviewed in Epic and read back. Called PCP and gave report to Patina.  IMPRESSION: 1. Obliquely oriented fracture distal aspect of the fifth proximal phalanx with alignment at the fracture site near anatomic.  2. Nondisplaced fracture along the lateral aspect of the proximal first distal phalanx.  3. No other evident fractures. No dislocation. No appreciable arthropathy.  These results will be called to the ordering clinician or representative by the Radiologist Assistant, and communication documented in the PACS or zVision Dashboard.   Electronically Signed   By: Lowella Grip III M.D.   On: 10/06/2017 09:07

## 2017-11-05 ENCOUNTER — Encounter: Payer: Self-pay | Admitting: Gastroenterology

## 2017-11-05 ENCOUNTER — Ambulatory Visit (INDEPENDENT_AMBULATORY_CARE_PROVIDER_SITE_OTHER): Payer: Medicare Other | Admitting: Gastroenterology

## 2017-11-05 VITALS — BP 131/89 | HR 130 | Ht 64.0 in | Wt 290.6 lb

## 2017-11-05 DIAGNOSIS — K760 Fatty (change of) liver, not elsewhere classified: Secondary | ICD-10-CM | POA: Diagnosis not present

## 2017-11-05 DIAGNOSIS — R109 Unspecified abdominal pain: Secondary | ICD-10-CM | POA: Diagnosis not present

## 2017-11-05 DIAGNOSIS — R197 Diarrhea, unspecified: Secondary | ICD-10-CM

## 2017-11-05 NOTE — Progress Notes (Signed)
Amanda Snyder [address]  [place], [place] [address]  Main: [phone]  Fax: [phone]   Gastroenterology Consultation  Referring Provider:     Leone Haven, MD Primary Care Physician:  Amanda Haven, MD Primary Gastroenterologist:  Dr. Vonda Snyder Reason for Consultation:     Fatty liver        HPI:    Chief Complaint  Patient presents with  . New Patient (Initial Visit)    referral from Dr. Caryl Bis, MD for elevated LFT's, pt c/o right side of stomach hurting, saying "don't feel good", decreased appetite. last few weeks had some vomiting and diarrhea.    Amanda Snyder is a 22 y.o. y/o female referred for consultation & management  by Dr. Caryl Snyder, Amanda Adam, MD.  Patient with history of developmental disability, presents for initial visit with her mother.  History obtained from mother and previous chart.  Patient unable to provide any history.  Mother states that she has noted patient touching her right upper to mid quadrant intermittently.  Patient has a history of obesity and fatty liver, and mother states patient usually eats well, and eats all the time, but over the last few weeks she has noted that the patient does not eat all her food.  About 6 weeks ago she had 2 days of vomiting without blood that self resolved.  She also had diarrhea at the time that self resolved.  However the diarrhea has recurred, and patient is having watery bowel movements over the last week without blood.  Mother initially noticed watery stools with changes in her diabetic medications recently, but the stools have been more loose and more watery over the last week.  No weight loss.  Fatty liver was diagnosed based on chronically elevated transaminases, and ultrasound 2017 reporting fatty liver.  No alcohol use.  No over-the-counter or herbal medications.  Past Medical History:  Diagnosis Date  . ADHD (attention deficit hyperactivity disorder) 2000   mom opted not to give medications (failed Adderall - 'zombielike', failed Strattera - 'no difference')  . Allergy    suspected allergy to pollen or dog(s)  . Anemia   . Diabetes mellitus without complication ([place])   . Hypertension   . Mental retardation 1997   Severe MR. did not walk until age 52-3 years, received Early Intervention.  . Obesity 12  . Premature birth with gestation of 35-36 weeks    [redacted] weeks GA  . Seizures ([place]) From age 42 month to 6 months   took Phenobarbital for a few years. weaned off after moving from [place] to [place]. Unknown etiology.  . Syncope 2006   witnessed by sister, no workup    Past Surgical History:  Procedure Laterality Date  . NO PAST SURGERIES      Prior to Admission medications   Medication Sig Start Date End Date Taking? Authorizing Provider  cetirizine (ZYRTEC) 10 MG tablet Take 1 tablet (10 mg total) by mouth daily. Patient taking differently: Take 10 mg by mouth as needed.  11/08/15  Yes Amanda Confer, MD  EPINEPHrine (EPIPEN 2-PAK) 0.3 mg/0.3 mL IJ SOAJ injection Inject 0.3 mLs (0.3 mg total) into the muscle once as needed (anaphylaxis). 09/24/17  Yes Amanda Haven, MD  ketoconazole (NIZORAL) 2 % shampoo Apply 1 application topically 2 (two) times a week. For 2-4 weeks 09/25/17  Yes Amanda Haven, MD  metFORMIN (GLUCOPHAGE-XR) 500 MG 24 hr tablet TAKE 3 TABLETS (1,500 MG TOTAL)  BY MOUTH DAILY WITH BREAKFAST. 09/03/17  Yes Amanda Haven, MD  sitaGLIPtin (JANUVIA) 100 MG tablet Take 1 tablet (100 mg total) by mouth daily. 09/26/17  Yes Amanda Haven, MD    Family History  Problem Relation Age of Onset  . Stroke Mother 71  . Psoriasis Mother   . Alcohol abuse Mother   . Hyperlipidemia Mother   . Mental retardation Mother   . Hypertension Mother   . Alcohol abuse Brother   . Other Father        Died from gunshot wound.  . Cancer Maternal Grandmother 48       breast ca  . Breast cancer Maternal Grandmother   . Alcohol abuse  Maternal Grandfather   . Diabetes Paternal Grandmother      Social History   Tobacco Use  . Smoking status: Never Smoker  . Smokeless tobacco: Never Used  Substance Use Topics  . Alcohol use: No    Alcohol/week: 0.0 oz  . Drug use: No    Allergies as of 11/05/2017 - Review Complete 11/05/2017  Allergen Reaction Noted  . Clams [shellfish allergy]  12/18/2013  . Other Other (See Comments) 02/20/2016    Review of Systems:    All systems reviewed and negative except where noted in HPI.   Physical Exam:  BP 131/89   Pulse (!) 130   Ht 5' 4"  (1.626 m)   Wt 290 lb 9.6 oz (131.8 kg)   BMI 49.88 kg/m  No LMP recorded. Psych:  Alert and cooperative.  Does not answer questions, but pleasant otherwise.  Normal mood and affect. General:   Alert,  Well-developed, well-nourished, pleasant and cooperative in NAD Head:  Normocephalic and atraumatic. Eyes:  Sclera clear, no icterus.   Conjunctiva pink. Ears:  Normal auditory acuity. Nose:  No deformity, discharge, or lesions. Mouth:  No deformity or lesions,oropharynx pink & moist. Neck:  Supple; no masses or thyromegaly. Lungs:  Respirations even and unlabored.  Clear throughout to auscultation.   No wheezes, crackles, or rhonchi. No acute distress. Msk:  Symmetrical without gross deformities. Good, equal movement & strength bilaterally. Pulses:  Normal pulses noted. Extremities:  No clubbing or edema.  No cyanosis. Skin:  Intact without significant lesions or rashes. No jaundice. Lymph Nodes:  No significant cervical adenopathy. Psych:  Alert and cooperative. Normal mood and affect.   Labs: CBC    Component Value Date/Time   WBC 9.3 09/25/2017 1554   RBC 5.10 09/25/2017 1554   HGB 11.4 (L) 09/25/2017 1554   HCT 35.9 (L) 09/25/2017 1554   PLT 421.0 (H) 09/25/2017 1554   MCV 70.5 (L) 09/25/2017 1554   MCH 22.5 (L) 06/20/2017 1618   MCHC 31.7 09/25/2017 1554   RDW 15.0 09/25/2017 1554   LYMPHSABS 3,294 06/20/2017 1618    MONOABS 0.7 07/05/2016 1535   EOSABS 267 06/20/2017 1618   BASOSABS 28 06/20/2017 1618   CMP     Component Value Date/Time   NA 137 09/25/2017 1554   K 3.8 09/25/2017 1554   CL 101 09/25/2017 1554   CO2 25 09/25/2017 1554   GLUCOSE 149 (H) 09/25/2017 1554   BUN 6 09/25/2017 1554   CREATININE 0.54 09/25/2017 1554   CREATININE 0.63 06/20/2017 1618   CALCIUM 9.2 09/25/2017 1554   PROT 7.4 09/25/2017 1554   ALBUMIN 3.9 09/25/2017 1554   AST 45 (H) 09/25/2017 1554   ALT 55 (H) 09/25/2017 1554   ALKPHOS 68 09/25/2017 1554   BILITOT  0.3 09/25/2017 1554    Imaging Studies: No results found.  Assessment and Plan:   Amanda Snyder is a 22 y.o. y/o female has been referred for fatty liver, with history of developmental disability, unable to provide any history, and history obtained from mother  It is unclear if patient specifically has abdominal pain, given that the patient is not able to communicate this Mother states she has intermittently noticed the patient touching the right side of her abdomen On my examination, patient does not have any pain when I palpate her abdomen She has not lost any weight or has any alarm symptoms such as blood in stool, or ongoing emesis  Clinical history does suggest that patient had gastroenteritis a few weeks ago with resolution of nausea and vomiting that occurred at that time However, diarrhea has recurred Therefore will obtain infectious work-up  Mother states she  herself was diagnosed with H. pylori in the past.  We will thus also obtain stool for H. pylori as that could be contributing to her abdominal pain  Patient was previously seen by hematology, Dr. Janese Banks for thrombocytosis.  She was supposed to follow-up with them but has not yet.  I have asked them to call Dr. Elroy Channel office for follow-up  She was also noted to have microcytosis that she was following with hematology for At her age, the most common cause of iron deficiency would be  menorrhagia.  Dr. Caryl Snyder can consider referral to GYN for this.   Her chronically elevated transaminases, and ultrasound of 2017 this most consistent with fatty liver However, will complete work-up with viral hepatitis and autoimmune hepatitis labs, and obtain liver ultrasound as well Mother states patient is able to cooperate with an ultrasound exam without difficulty as long as his mother is present with her and so she is willing to get this done.  Finding of fatty liver on imaging discussed with patient's mother Diet, weight loss, and exercise encouraged along with avoiding hepatotoxic drugs including alcohol Risk of progression to cirrhosis if above measures are not instituted were discussed as well, and patient verbalized understanding   Dr Amanda Snyder

## 2017-11-06 ENCOUNTER — Telehealth: Payer: Self-pay | Admitting: Family Medicine

## 2017-11-06 DIAGNOSIS — G4730 Sleep apnea, unspecified: Secondary | ICD-10-CM

## 2017-11-06 DIAGNOSIS — D696 Thrombocytopenia, unspecified: Secondary | ICD-10-CM

## 2017-11-06 NOTE — Telephone Encounter (Signed)
-----   Message from Virgel Manifold, MD sent at 11/05/2017  2:22 PM EDT ----- Hi Dr. Caryl Bis,  Thank you for referring this patient. I agree with your assessment. Her liver enzymes are likely due to fatty liver. I have ordered tests for her diarrhea and fatty liver. I will also obtain an Abdominal Ultrasound  Her mother reports patient snores and has weird breathing spells while sleeping.  I have asked him to follow-up with you in regard to sleep apnea evaluation and sleep study.    They also saw hematology last year for thrombocytosis and were supposed to follow-up with them.  I have asked her mother to call them as well for follow up appointment as I do not see that they followed up with them.  Thank You Margretta Sidle

## 2017-11-06 NOTE — Telephone Encounter (Signed)
Please let the patient's mom know I received a message from Dr. Bonna Gains after their visit with her.  They endorsed some abnormal sleeping issues.  I would like to refer to a sleep specialist if they are willing.  It also looks like they were supposed to follow-up with hematology though did not.  Please advise the patient's mom that she should contact hematology to set up follow-up.  Thanks.

## 2017-11-06 NOTE — Telephone Encounter (Signed)
Patients mother notified and she would like the referral for patient. She also states she would like a referral to hematology in [place].

## 2017-11-06 NOTE — Telephone Encounter (Signed)
Referral placed.

## 2017-11-06 NOTE — Addendum Note (Signed)
Addended by: Caryl Bis, Eman Rynders G on: 11/06/2017 12:13 PM   Modules accepted: Orders

## 2017-11-07 ENCOUNTER — Other Ambulatory Visit: Payer: Self-pay

## 2017-11-07 ENCOUNTER — Telehealth: Payer: Self-pay

## 2017-11-07 DIAGNOSIS — R748 Abnormal levels of other serum enzymes: Secondary | ICD-10-CM

## 2017-11-07 NOTE — Telephone Encounter (Signed)
Left message for pt mother, Solmon Ice to contact office.  (ultrasound has been scheduled for 11/18/17 at 9:15am, arrival time 8:45am in [place] as requested. Address: [address] contact scheduling at [phone]). Will also send message via My Chart.

## 2017-11-16 IMAGING — US US ABDOMEN LIMITED
1 series · 14 of 25 positions shown · non-contrast
Comparison: No recent prior.

CLINICAL DATA: Pain.

EXAM:
US ABDOMEN LIMITED - RIGHT UPPER QUADRANT

[Series 1: us abdomen limited · 0.25mm/px · 14 of 35 slices shown]
[im 1/35]
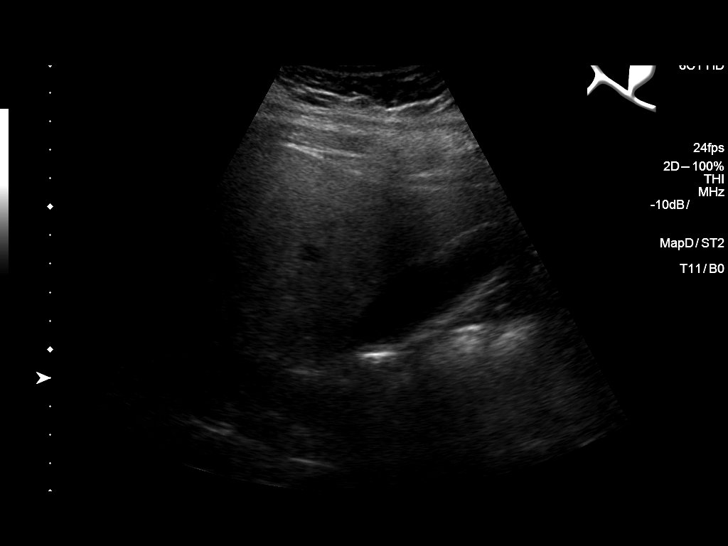
[im 3/35]
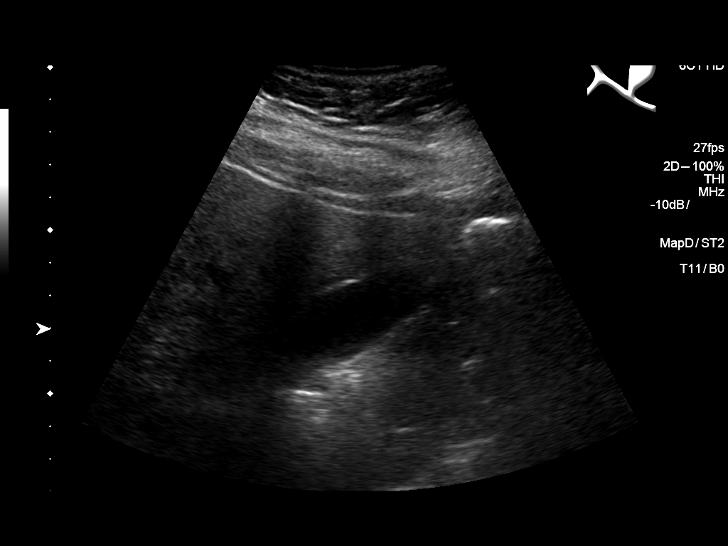
[im 6/35]
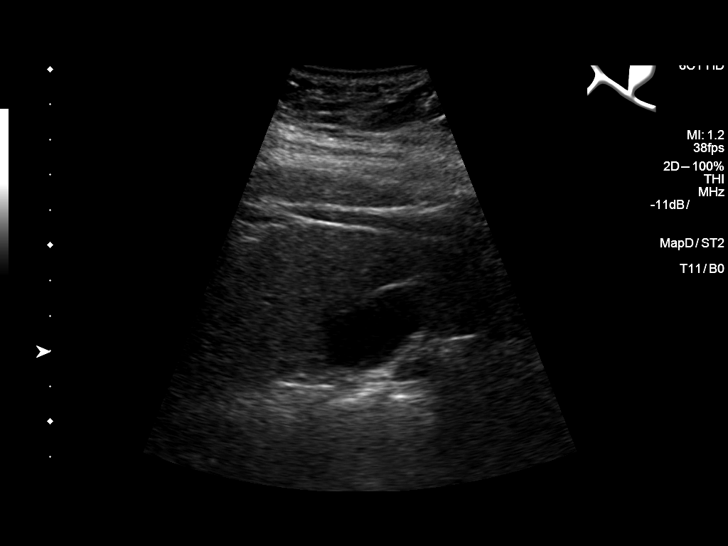
[im 9/35]
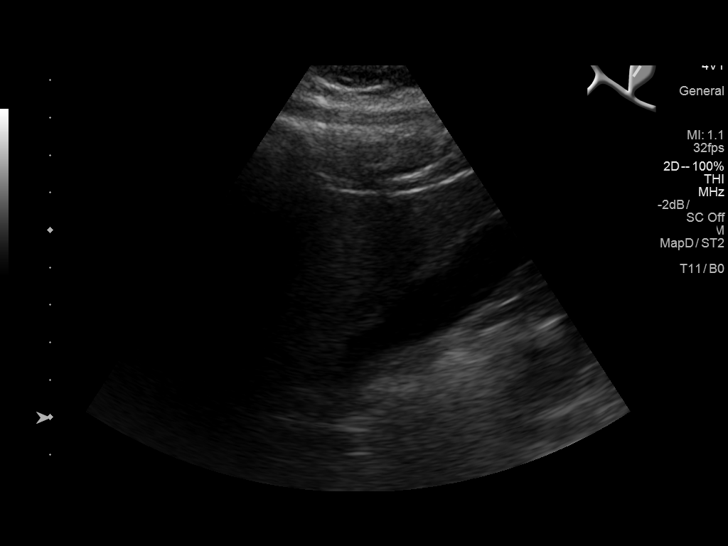
[im 12/35]
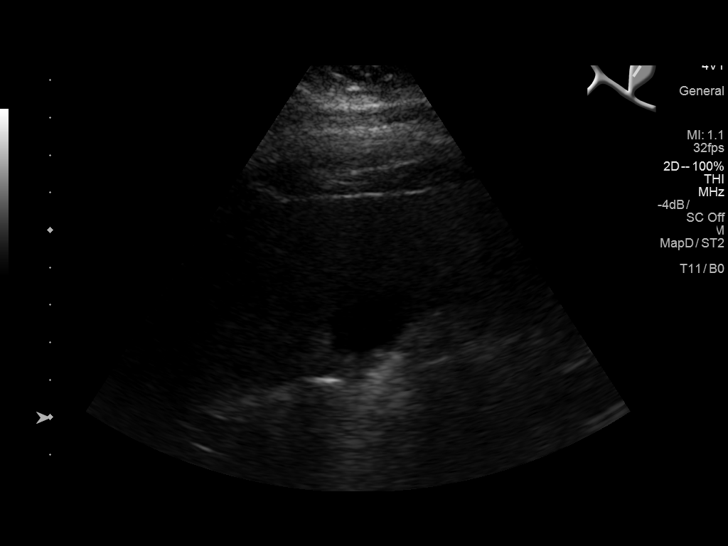
[im 13/35]
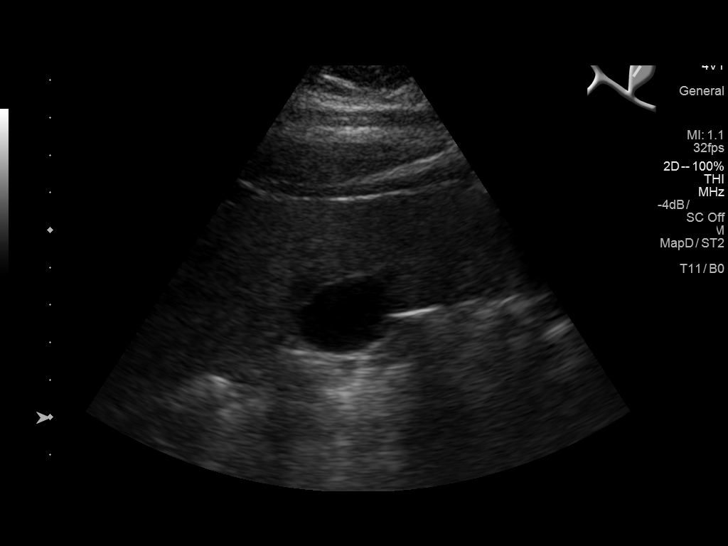
[im 16/35]
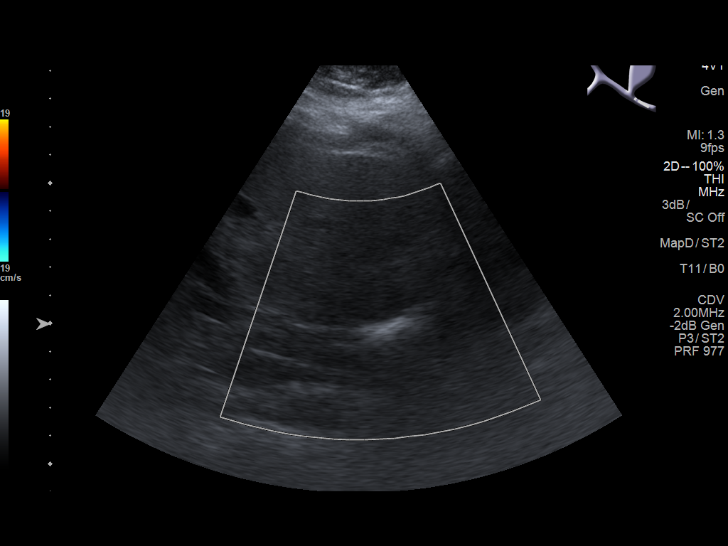
[im 19/35]
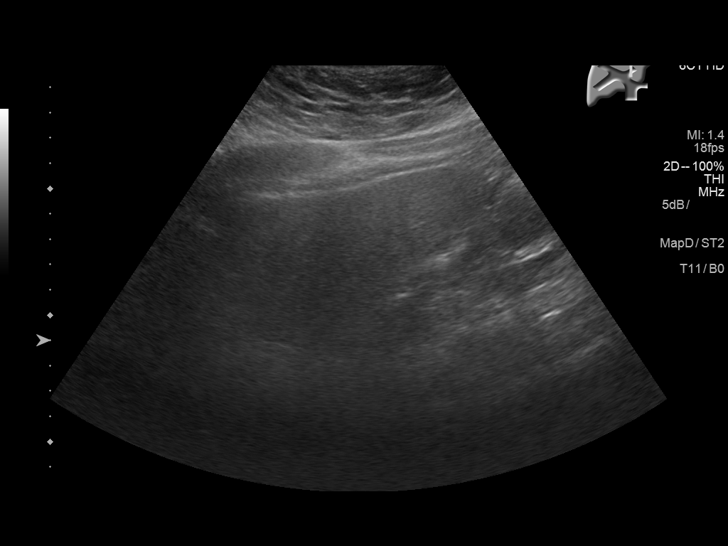
[im 22/35]
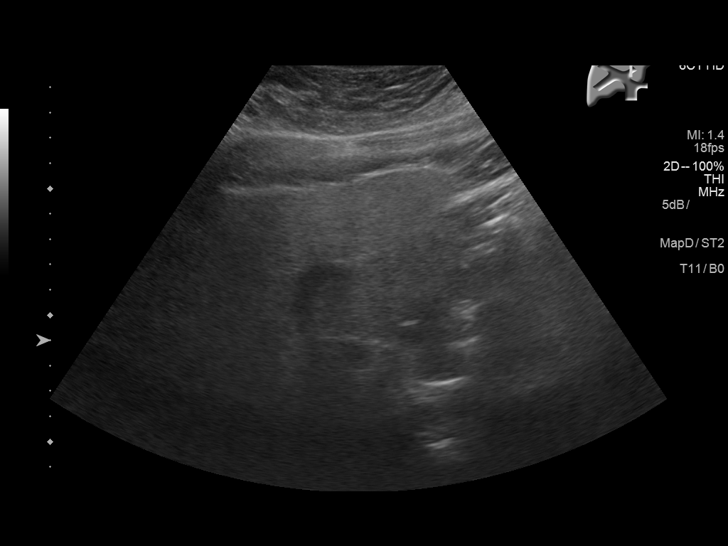
[im 23/35]
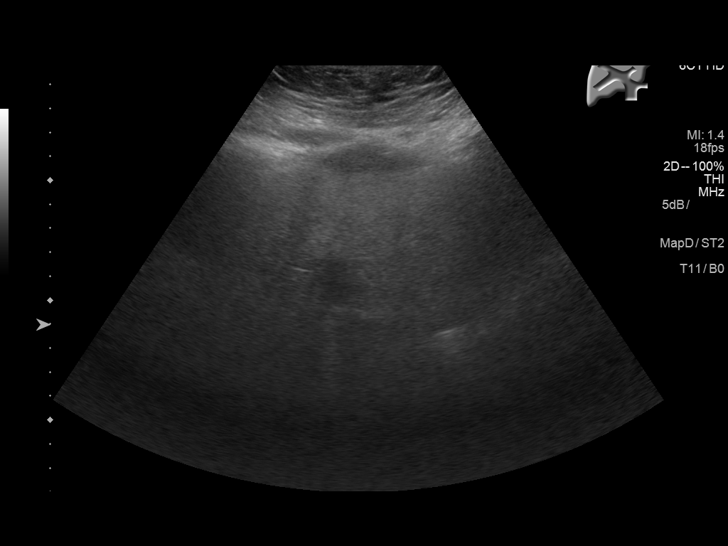
[im 26/35]
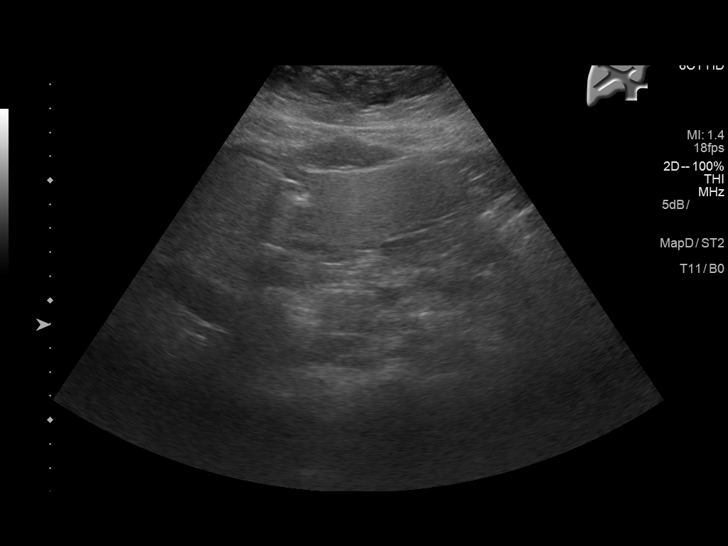
[im 29/35]
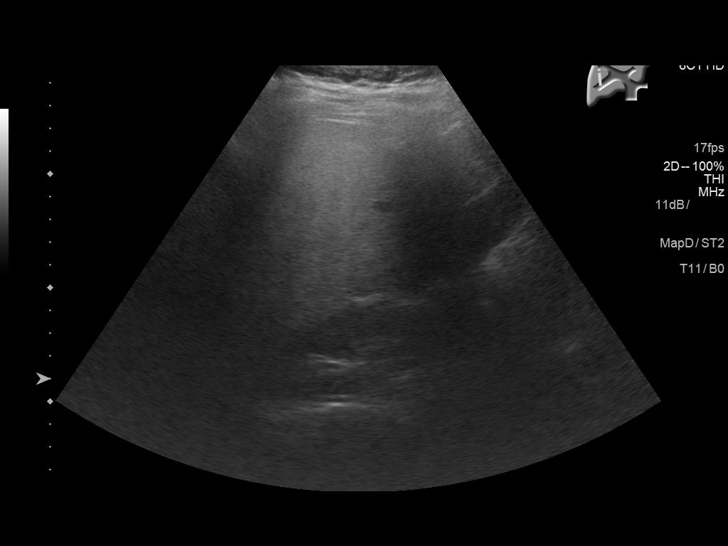
[im 32/35]
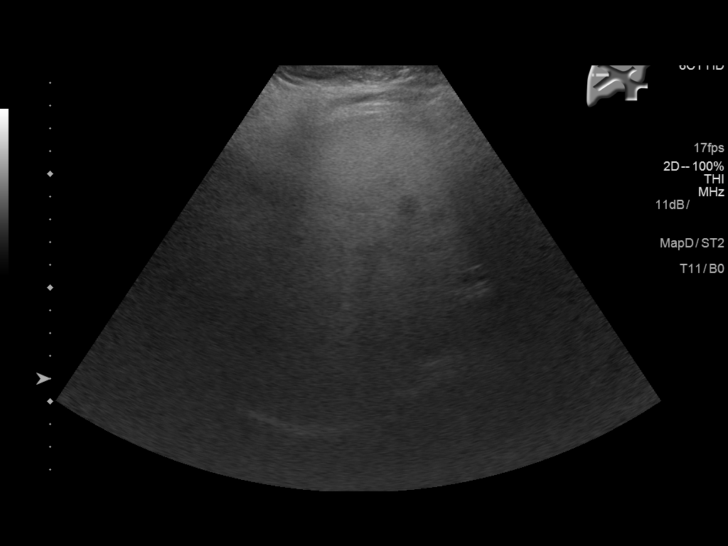
[im 35/35]
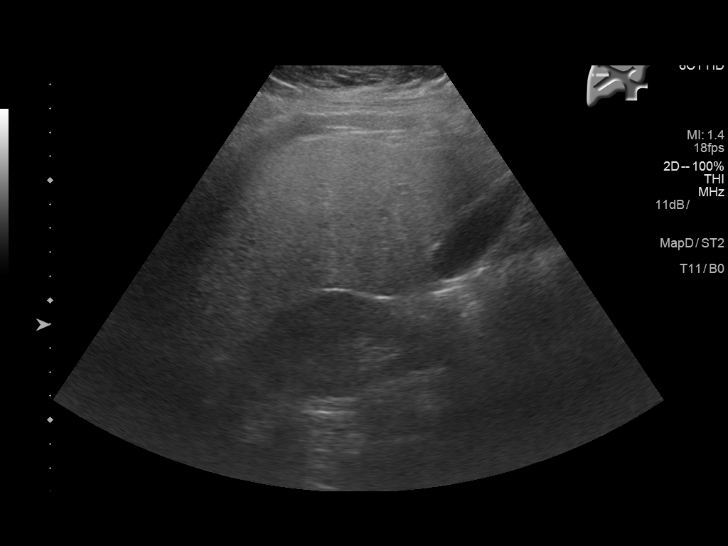

[14 of 25 positions shown; findings below may reference images not displayed]

FINDINGS: Gallbladder:

No gallstones or wall thickening visualized. No sonographic Murphy
sign noted by sonographer.

Common bile duct:

Diameter: 4.1 mm

Liver:

The liver is echogenic consistent with fatty infiltration and/or
hepatocellular disease. No focal hepatic abnormality identified.
Exam was very limited due to the patient's clinical condition and
body habitus.
IMPRESSION: 1. No acute abnormality.

2. Liver is echogenic consistent fatty infiltration and/or
hepatocellular disease.

## 2017-11-18 ENCOUNTER — Ambulatory Visit
Admission: RE | Admit: 2017-11-18 | Discharge: 2017-11-18 | Disposition: A | Discharge status: Home / Self Care | Payer: Medicare Other | Source: Ambulatory Visit | Attending: Gastroenterology | Admitting: Gastroenterology

## 2017-11-18 DIAGNOSIS — R1011 Right upper quadrant pain: Secondary | ICD-10-CM | POA: Diagnosis not present

## 2017-11-18 DIAGNOSIS — R748 Abnormal levels of other serum enzymes: Secondary | ICD-10-CM

## 2017-11-21 NOTE — Telephone Encounter (Signed)
No return call but pt did have her ultrasound done.

## 2017-11-22 ENCOUNTER — Encounter: Payer: Self-pay | Admitting: Family Medicine

## 2017-12-01 ENCOUNTER — Telehealth: Payer: Self-pay | Admitting: Hematology and Oncology

## 2017-12-01 NOTE — Telephone Encounter (Signed)
New hematology referral received from Dr. Caryl Bis for thrombocytopenia. Pt has been scheduled to see Dr. Lindi Adie on 9/11 at 1pm. Pt's mom, Mazey Mantell, agreed to the appt date and time.

## 2017-12-12 ENCOUNTER — Encounter: Payer: Self-pay | Admitting: Pulmonary Disease

## 2017-12-12 ENCOUNTER — Ambulatory Visit (INDEPENDENT_AMBULATORY_CARE_PROVIDER_SITE_OTHER): Payer: Medicare Other | Admitting: Pulmonary Disease

## 2017-12-12 ENCOUNTER — Other Ambulatory Visit: Payer: Self-pay

## 2017-12-12 ENCOUNTER — Other Ambulatory Visit: Payer: Medicare Other

## 2017-12-12 VITALS — BP 120/60 | HR 111 | Ht 61.5 in | Wt 288.0 lb

## 2017-12-12 DIAGNOSIS — K760 Fatty (change of) liver, not elsewhere classified: Secondary | ICD-10-CM | POA: Diagnosis not present

## 2017-12-12 DIAGNOSIS — R0683 Snoring: Secondary | ICD-10-CM

## 2017-12-12 DIAGNOSIS — R197 Diarrhea, unspecified: Secondary | ICD-10-CM

## 2017-12-12 NOTE — Addendum Note (Signed)
Addended by: Trenda Moots on: 6/0/6004 12:20 PM   Modules accepted: Orders

## 2017-12-12 NOTE — Addendum Note (Signed)
Addended by: Trenda Moots on: 08/09/9670 12:20 PM   Modules accepted: Orders

## 2017-12-12 NOTE — Addendum Note (Signed)
Addended by: Trenda Moots on: 0/12/7351 12:21 PM   Modules accepted: Orders

## 2017-12-12 NOTE — Addendum Note (Signed)
Addended by: Trenda Moots on: 0/09/154 12:21 PM   Modules accepted: Orders

## 2017-12-12 NOTE — Addendum Note (Signed)
Addended by: Trenda Moots on: 05/09/6242 12:21 PM   Modules accepted: Orders

## 2017-12-12 NOTE — Addendum Note (Signed)
Addended by: Trenda Moots on: 09/08/2631 12:21 PM   Modules accepted: Orders

## 2017-12-12 NOTE — Progress Notes (Signed)
Amanda Snyder    [id number]    June 04, 1995  Primary Care Physician:Sonnenberg, Angela Adam, MD  Referring Physician: Leone Haven, MD [address] [place], [place] [address]  Chief complaint:   Gasping respiration, snoring at night  HPI:  Mom has noted increasing snoring, gasping respiration at night Did not witness apneas She does have behavioral problems We will not be able to cooperate with a full study Mom is not sure that she will be able to tolerate anything regarding using a CPAP and unsure whether she will be able to get a home sleep study done as well She is concerned and desires some evaluation as able Usually goes to bed about 9 PM Not able to fall asleep immediately Tries to wake up at 6 AM Has gained about 40 pounds recently-working on weight loss  She has a history of polycystic ovarian syndrome, severe intellectual disability Morbid obesity   Outpatient Encounter Medications as of 12/12/2017  Medication Sig  . cetirizine (ZYRTEC) 10 MG tablet Take 1 tablet (10 mg total) by mouth daily. (Patient taking differently: Take 10 mg by mouth as needed. )  . EPINEPHrine (EPIPEN 2-PAK) 0.3 mg/0.3 mL IJ SOAJ injection Inject 0.3 mLs (0.3 mg total) into the muscle once as needed (anaphylaxis).  Marland Kitchen ketoconazole (NIZORAL) 2 % shampoo Apply 1 application topically 2 (two) times a week. For 2-4 weeks  . metFORMIN (GLUCOPHAGE-XR) 500 MG 24 hr tablet TAKE 3 TABLETS (1,500 MG TOTAL) BY MOUTH DAILY WITH BREAKFAST.  Marland Kitchen sitaGLIPtin (JANUVIA) 100 MG tablet Take 1 tablet (100 mg total) by mouth daily.   No facility-administered encounter medications on file as of 12/12/2017.     Allergies as of 12/12/2017 - Review Complete 12/12/2017  Allergen Reaction Noted  . Clams [shellfish allergy]  12/18/2013  . Other Other (See Comments) 02/20/2016    Past Medical History:  Diagnosis Date  . ADHD (attention deficit hyperactivity disorder) 2000   mom opted not to give  medications (failed Adderall - 'zombielike', failed Strattera - 'no difference')  . Allergy    suspected allergy to pollen or dog(s)  . Anemia   . Diabetes mellitus without complication ([place])   . Hypertension   . Mental retardation 1997   Severe MR. did not walk until age 4-3 years, received Early Intervention.  . Obesity 12  . Premature birth with gestation of 35-36 weeks    [redacted] weeks GA  . Seizures ([place]) From age 65 month to 6 months   took Phenobarbital for a few years. weaned off after moving from [place] to [place]. Unknown etiology.  . Syncope 2006   witnessed by sister, no workup    Past Surgical History:  Procedure Laterality Date  . NO PAST SURGERIES      Family History  Problem Relation Age of Onset  . Stroke Mother 1  . Psoriasis Mother   . Alcohol abuse Mother   . Hyperlipidemia Mother   . Mental retardation Mother   . Hypertension Mother   . Alcohol abuse Brother   . Other Father        Died from gunshot wound.  . Cancer Maternal Grandmother 48       breast ca  . Breast cancer Maternal Grandmother   . Alcohol abuse Maternal Grandfather   . Diabetes Paternal Grandmother     Social History   Socioeconomic History  . Marital status: Single    Spouse name: Not on  file  . Number of children: 0  . Years of education: Not on file  . Highest education level: Not on file  Occupational History  . Occupation: Unemployed  Social Needs  . Financial resource strain: Not on file  . Food insecurity:    Worry: Not on file    Inability: Not on file  . Transportation needs:    Medical: Not on file    Non-medical: Not on file  Tobacco Use  . Smoking status: Never Smoker  . Smokeless tobacco: Never Used  Substance and Sexual Activity  . Alcohol use: No    Alcohol/week: 0.0 standard drinks  . Drug use: No  . Sexual activity: Not on file  Lifestyle  . Physical activity:    Days per week: Not on file    Minutes per session: Not on file  . Stress: Not on file    Relationships  . Social connections:    Talks on phone: Not on file    Gets together: Not on file    Attends religious service: Not on file    Active member of club or organization: Not on file    Attends meetings of clubs or organizations: Not on file    Relationship status: Not on file  . Intimate partner violence:    Fear of current or ex partner: Not on file    Emotionally abused: Not on file    Physically abused: Not on file    Forced sexual activity: Not on file  Other Topics Concern  . Not on file  Social History Narrative   Lives at home with mom and youngest sister (2 years younger).      Attends [employer] on [employer] ([employer] - will age out at 70 years. 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 Has IEP. She will transition into a day program in June.      Mother maintains guardianship (beyond 7 years of age).      Right-handed.      1-2 caffeine drinks per week.       Review of Systems  Unable to perform ROS: Other  Unable to provide answers appropriately  Vitals:   12/12/17 1129 12/12/17 1134  BP:  120/60  Pulse: (!) 111 (!) 111  SpO2: 99% 98%     Physical Exam  Constitutional: She is oriented to person, place, and time. She appears well-developed and well-nourished.  HENT:  Head: Normocephalic and atraumatic.  Mallampati 4, low arched palate  Eyes: Right eye exhibits no discharge. Left eye exhibits no discharge.  Neck: Normal range of motion. Neck supple. No tracheal deviation present. No thyromegaly present.  Cardiovascular: Normal rate and regular rhythm.  Pulmonary/Chest: Breath sounds normal. No respiratory distress. She has no wheezes. She has no rales.  Abdominal: Soft. Bowel sounds are normal. She exhibits no distension. There is no tenderness.  Musculoskeletal: Normal range of motion. She exhibits no edema or deformity.  Neurological: She is alert and oriented to person, place, and time. She  has normal reflexes. No cranial nerve deficit. Coordination normal.  Skin: Skin is warm and dry. No erythema.   Data Reviewed: Records reviewed  Assessment:  Probability of significant sleep disordered breathing  Morbid obesity  Severe intellectual disability  Options with diagnosis may be limited if patient is unable to have a study done cooperatively  Plan/Recommendations:  A home sleep study will be attempted  I do not believe she will  be able to perform an in lab study- will not keep  Leads in place  Pathophysiology of sleep disordered breathing discussed with her mom  I do think presently, focusing on weight loss and exercise as possible is most appropriate  I will see her in a few months   Sherrilyn Rist MD [facility] 12/12/2017, 11:58 AM  CC: Leone Haven, MD

## 2017-12-12 NOTE — Patient Instructions (Signed)
Moderate to high probability of significant sleep disordered breathing  Home sleep study will be attempted  I do not think she will be able to tolerate an in lab study/she may not sleep with all the wires that need to be attached for the study  If she is not able to tolerate having a home sleep study performed, I believe trying to treat obstructive sleep apnea will cause her more difficulties  I will see her back as needed, will depend on if we are able to get the study done or not  Tentatively scheduled for 81-monthfollow-up  Continue to work on weight loss efforts

## 2017-12-12 NOTE — Addendum Note (Signed)
Addended by: Trenda Moots on: 06/12/7937 12:20 PM   Modules accepted: Orders

## 2017-12-12 NOTE — Addendum Note (Signed)
Addended by: Trenda Moots on: 08/12/7207 12:21 PM   Modules accepted: Orders

## 2017-12-12 NOTE — Addendum Note (Signed)
Addended by: Trenda Moots on: 06/08/1248 12:22 PM   Modules accepted: Orders

## 2017-12-12 NOTE — Addendum Note (Signed)
Addended by: Trenda Moots on: 0/06/4033 12:21 PM   Modules accepted: Orders

## 2017-12-12 NOTE — Addendum Note (Signed)
Addended by: Trenda Moots on: 0/12/9066 12:19 PM   Modules accepted: Orders

## 2017-12-17 ENCOUNTER — Inpatient Hospital Stay: Payer: Medicare Other | Attending: Hematology and Oncology | Admitting: Hematology and Oncology

## 2017-12-17 ENCOUNTER — Telehealth: Payer: Self-pay

## 2017-12-17 ENCOUNTER — Other Ambulatory Visit: Payer: Medicare Other

## 2017-12-17 VITALS — BP 128/93 | HR 100 | Temp 98.4°F | Resp 18 | Ht 61.5 in | Wt 285.3 lb

## 2017-12-17 DIAGNOSIS — E669 Obesity, unspecified: Secondary | ICD-10-CM | POA: Diagnosis not present

## 2017-12-17 DIAGNOSIS — F89 Unspecified disorder of psychological development: Secondary | ICD-10-CM | POA: Insufficient documentation

## 2017-12-17 DIAGNOSIS — I10 Essential (primary) hypertension: Secondary | ICD-10-CM | POA: Diagnosis not present

## 2017-12-17 DIAGNOSIS — D473 Essential (hemorrhagic) thrombocythemia: Secondary | ICD-10-CM | POA: Diagnosis not present

## 2017-12-17 DIAGNOSIS — Z862 Personal history of diseases of the blood and blood-forming organs and certain disorders involving the immune mechanism: Secondary | ICD-10-CM | POA: Insufficient documentation

## 2017-12-17 DIAGNOSIS — K7581 Nonalcoholic steatohepatitis (NASH): Secondary | ICD-10-CM | POA: Diagnosis not present

## 2017-12-17 DIAGNOSIS — D75839 Thrombocytosis, unspecified: Secondary | ICD-10-CM | POA: Insufficient documentation

## 2017-12-17 DIAGNOSIS — E119 Type 2 diabetes mellitus without complications: Secondary | ICD-10-CM

## 2017-12-17 DIAGNOSIS — K760 Fatty (change of) liver, not elsewhere classified: Secondary | ICD-10-CM | POA: Diagnosis not present

## 2017-12-17 DIAGNOSIS — D509 Iron deficiency anemia, unspecified: Secondary | ICD-10-CM

## 2017-12-17 NOTE — Assessment & Plan Note (Signed)
Platelet count 421, previously was 458 and 472 I discussed with the patient's caregiver the causes of elevated platelet count.  Differential diagnosis 1. Primary thrombocytosis: Related to myeloproliferative disorders of the bone marrow especially essential thrombocytosis  2. Secondary/reactive thrombocytosis Different causes including infections, inflammation, iron deficiency.  Patient had iron studies previously done which were normal.    Treatment options: I discussed with the patient's caregiver that there is no indication to treat her. The possible cause of inflammation could be nonalcoholic steatohepatitis that she was diagnosed with.  It could also be any other site of inflammation.  There is no indication for treatment since the platelet count is not markedly elevated.  The platelet count goes above 600 I will need to see her again.  Microcytosis: With previous normal iron studies and suggest that she may have beta thalassemia minor.  I will order hemoglobin electrophoresis.  We will call her with the result of this test.  Return to clinic on as-needed basis.

## 2017-12-17 NOTE — Progress Notes (Signed)
[facility] NOTE  Patient Care Team: Leone Haven, MD as PCP - General (Family Medicine) Gaynell Face Princess Bruins, MD as Consulting Physician (Pediatrics)  CHIEF COMPLAINTS/PURPOSE OF CONSULTATION:  Thrombocytosis  HISTORY OF PRESENTING ILLNESS:  Amanda Snyder 22 y.o. female is here because of recent diagnosis of elevated platelet count.  Patient has developmental retardation and is here with a caregiver.  She is fairly obese and was diagnosed with nonalcoholic steatohepatitis.  As part of the routine work-up CBC was done and she was noted to have elevated platelet count.  The platelet counts were in the range of 400-470.  Because of the elevation of platelet count patient was referred to [place] for evaluation.  She apparently had a prior history of iron deficiency anemia.  I reviewed her records extensively and collaborated the history with the patient.  MEDICAL HISTORY:  Past Medical History:  Diagnosis Date  . ADHD (attention deficit hyperactivity disorder) 2000   mom opted not to give medications (failed Adderall - 'zombielike', failed Strattera - 'no difference')  . Allergy    suspected allergy to pollen or dog(s)  . Anemia   . Diabetes mellitus without complication ([place])   . Hypertension   . Mental retardation 1997   Severe MR. did not walk until age 100-3 years, received Early Intervention.  . Obesity 12  . Premature birth with gestation of 35-36 weeks    [redacted] weeks GA  . Seizures ([place]) From age 10 month to 6 months   took Phenobarbital for a few years. weaned off after moving from [place] to [place]. Unknown etiology.  . Syncope 2006   witnessed by sister, no workup    SURGICAL HISTORY: Past Surgical History:  Procedure Laterality Date  . NO PAST SURGERIES      SOCIAL HISTORY: Social History   Socioeconomic History  . Marital status: Single    Spouse name: Not on file  . Number of children: 0  . Years of education: Not on file  . Highest education  level: Not on file  Occupational History  . Occupation: Unemployed  Social Needs  . Financial resource strain: Not on file  . Food insecurity:    Worry: Not on file    Inability: Not on file  . Transportation needs:    Medical: Not on file    Non-medical: Not on file  Tobacco Use  . Smoking status: Never Smoker  . Smokeless tobacco: Never Used  Substance and Sexual Activity  . Alcohol use: No    Alcohol/week: 0.0 standard drinks  . Drug use: No  . Sexual activity: Not on file  Lifestyle  . Physical activity:    Days per week: Not on file    Minutes per session: Not on file  . Stress: Not on file  Relationships  . Social connections:    Talks on phone: Not on file    Gets together: Not on file    Attends religious service: Not on file    Active member of club or organization: Not on file    Attends meetings of clubs or organizations: Not on file    Relationship status: Not on file  . Intimate partner violence:    Fear of current or ex partner: Not on file    Emotionally abused: Not on file    Physically abused: Not on file    Forced sexual activity: Not on file  Other Topics Concern  . Not on file  Social History Narrative  Lives at home with mom and youngest sister (2 years younger).      Attends [employer] on [employer] - will age out at 44 years. 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 Has IEP. She will transition into a day program in June.      Mother maintains guardianship (beyond 6 years of age).      Right-handed.      1-2 caffeine drinks per week.       FAMILY HISTORY: Family History  Problem Relation Age of Onset  . Stroke Mother 50  . Psoriasis Mother   . Alcohol abuse Mother   . Hyperlipidemia Mother   . Mental retardation Mother   . Hypertension Mother   . Alcohol abuse Brother   . Other Father        Died from gunshot wound.  . Cancer Maternal Grandmother 48       breast ca  .  Breast cancer Maternal Grandmother   . Alcohol abuse Maternal Grandfather   . Diabetes Paternal Grandmother     ALLERGIES:  is allergic to clams [shellfish allergy] and other.  MEDICATIONS:  Current Outpatient Medications  Medication Sig Dispense Refill  . cetirizine (ZYRTEC) 10 MG tablet Take 1 tablet (10 mg total) by mouth daily. (Patient taking differently: Take 10 mg by mouth as needed. ) 30 tablet 11  . EPINEPHrine (EPIPEN 2-PAK) 0.3 mg/0.3 mL IJ SOAJ injection Inject 0.3 mLs (0.3 mg total) into the muscle once as needed (anaphylaxis). 1 Device 0  . ketoconazole (NIZORAL) 2 % shampoo Apply 1 application topically 2 (two) times a week. For 2-4 weeks 120 mL 0  . metFORMIN (GLUCOPHAGE-XR) 500 MG 24 hr tablet TAKE 3 TABLETS (1,500 MG TOTAL) BY MOUTH DAILY WITH BREAKFAST. 270 tablet 1  . sitaGLIPtin (JANUVIA) 100 MG tablet Take 1 tablet (100 mg total) by mouth daily. 30 tablet 3   No current facility-administered medications for this visit.     REVIEW OF SYSTEMS:   Constitutional: Denies fevers, chills or abnormal night sweats Eyes: Denies blurriness of vision, double vision or watery eyes Ears, nose, mouth, throat, and face: Denies mucositis or sore throat Respiratory: Denies cough, dyspnea or wheezes Cardiovascular: Denies palpitation, chest discomfort or lower extremity swelling Gastrointestinal:  Denies nausea, heartburn or change in bowel habits Skin: Denies abnormal skin rashes Lymphatics: Denies new lymphadenopathy or easy bruising Neurological:Denies numbness, tingling or new weaknesses Behavioral/Psych: Mental retardation  All other systems were reviewed with the patient and are negative.  PHYSICAL EXAMINATION: ECOG PERFORMANCE STATUS: 1 - Symptomatic but completely ambulatory  Vitals:   12/17/17 1307  BP: (!) 128/93  Pulse: 100  Resp: 18  Temp: 98.4 F (36.9 C)  SpO2: 100%   Filed Weights   12/17/17 1307  Weight: 285 lb 4.8 oz (129.4 kg)    GENERAL:alert, no  distress and comfortable SKIN: skin color, texture, turgor are normal, no rashes or significant lesions EYES: normal, conjunctiva are pink and non-injected, sclera clear OROPHARYNX:no exudate, no erythema and lips, buccal mucosa, and tongue normal  NECK: supple, thyroid normal size, non-tender, without nodularity LYMPH:  no palpable lymphadenopathy in the cervical, axillary or inguinal LUNGS: clear to auscultation and percussion with normal breathing effort HEART: regular rate & rhythm and no murmurs and no lower extremity edema ABDOMEN:abdomen soft, non-tender and normal bowel sounds Musculoskeletal:no cyanosis of digits and no clubbing  PSYCH: Mental retardation NEURO: no focal motor/sensory deficits  LABORATORY DATA:  I have reviewed the data as listed Lab Results  Component Value Date   WBC 9.3 09/25/2017   HGB 11.4 (L) 09/25/2017   HCT 35.9 (L) 09/25/2017   MCV 70.5 (L) 09/25/2017   PLT 421.0 (H) 09/25/2017   Lab Results  Component Value Date   NA 137 09/25/2017   K 3.8 09/25/2017   CL 101 09/25/2017   CO2 25 09/25/2017    RADIOGRAPHIC STUDIES: I have personally reviewed the radiological reports and agreed with the findings in the report.  ASSESSMENT AND PLAN:  Thrombocytosis ([place]) Platelet count 421, previously was 458 and 472 I discussed with the patient's caregiver the causes of elevated platelet count.  Differential diagnosis 1. Primary thrombocytosis: Related to myeloproliferative disorders of the bone marrow especially essential thrombocytosis  2. Secondary/reactive thrombocytosis Different causes including infections, inflammation, iron deficiency.  Patient had iron studies previously done which were normal.    Treatment options: I discussed with the patient's caregiver that there is no indication to treat her. The possible cause of inflammation could be nonalcoholic steatohepatitis that she was diagnosed with.  It could also be any other site of  inflammation.  There is no indication for treatment since the platelet count is not markedly elevated.  The platelet count goes above 600 I will need to see her again.  Microcytosis: With previous normal iron studies and suggest that she may have beta thalassemia minor.  I will order hemoglobin electrophoresis.  We will call her with the result of this test.  Return to clinic on as-needed basis.   All questions were answered. The patient knows to call the clinic with any problems, questions or concerns.    Harriette Ohara, MD 12/17/17

## 2017-12-17 NOTE — Telephone Encounter (Signed)
Received call from [facility] lab to notify MD that the additional lab Dr.Gudena ordered were not drawn today and was released accidentally. Per Dr.Gudena, cancel lab hemoglobinopathy evaluation lab.

## 2017-12-18 LAB — CERULOPLASMIN: Ceruloplasmin: 38 mg/dL (ref 18–53)

## 2017-12-18 LAB — HEPATITIS B SURFACE ANTIGEN: HEP B S AG: NONREACTIVE

## 2017-12-18 LAB — HEPATITIS B CORE ANTIBODY, TOTAL: Hep B Core Total Ab: NONREACTIVE

## 2017-12-18 LAB — HEPATITIS A ANTIBODY, TOTAL: Hepatitis A AB,Total: NONREACTIVE

## 2017-12-18 LAB — HEPATITIS B SURFACE ANTIBODY,QUALITATIVE: HEP B S AB: NONREACTIVE

## 2017-12-19 LAB — SPECIMEN STATUS REPORT

## 2017-12-19 LAB — MITOCHONDRIAL/SMOOTH MUSCLE AB PNL: SMOOTH MUSCLE AB: 9 U (ref 0–19)

## 2017-12-19 LAB — HEPATITIS C ANTIBODY (REFLEX): HCV Ab: 0.1 s/co ratio (ref 0.0–0.9)

## 2017-12-19 LAB — HCV COMMENT:

## 2017-12-21 ENCOUNTER — Other Ambulatory Visit: Payer: Self-pay | Admitting: Family Medicine

## 2017-12-26 ENCOUNTER — Telehealth: Payer: Self-pay

## 2017-12-26 NOTE — Telephone Encounter (Signed)
LVM for pt to return my call. Advised on vm normal lab results. Inquired about stool test still pending.

## 2017-12-26 NOTE — Telephone Encounter (Signed)
-----   Message from Virgel Manifold, MD sent at 12/22/2017  3:19 PM EDT ----- Jackelyn Poling please let patient know, her bloodwork was normal. We are still awaiting her stool tests

## 2017-12-31 NOTE — Telephone Encounter (Signed)
Pt notified of lab results. Pt stated she wanted to cancel the stool sample labs because it is difficult for her to get those to the lab.

## 2017-12-31 NOTE — Telephone Encounter (Signed)
LVM for pt to return my call.

## 2018-01-07 ENCOUNTER — Ambulatory Visit (INDEPENDENT_AMBULATORY_CARE_PROVIDER_SITE_OTHER): Payer: Medicare Other | Admitting: Family Medicine

## 2018-01-07 ENCOUNTER — Encounter: Payer: Self-pay | Admitting: Family Medicine

## 2018-01-07 VITALS — BP 122/88 | HR 108 | Temp 98.5°F | Ht 61.5 in | Wt 288.4 lb

## 2018-01-07 DIAGNOSIS — L219 Seborrheic dermatitis, unspecified: Secondary | ICD-10-CM | POA: Diagnosis not present

## 2018-01-07 DIAGNOSIS — N926 Irregular menstruation, unspecified: Secondary | ICD-10-CM

## 2018-01-07 DIAGNOSIS — G4730 Sleep apnea, unspecified: Secondary | ICD-10-CM | POA: Diagnosis not present

## 2018-01-07 DIAGNOSIS — E282 Polycystic ovarian syndrome: Secondary | ICD-10-CM | POA: Diagnosis not present

## 2018-01-07 DIAGNOSIS — E119 Type 2 diabetes mellitus without complications: Secondary | ICD-10-CM

## 2018-01-07 DIAGNOSIS — K760 Fatty (change of) liver, not elsewhere classified: Secondary | ICD-10-CM

## 2018-01-07 NOTE — Assessment & Plan Note (Signed)
Discussed diet and exercise 

## 2018-01-07 NOTE — Assessment & Plan Note (Signed)
She has seen GI.  I will send them this note to see if they can order blood testing to evaluate for H. pylori.

## 2018-01-07 NOTE — Assessment & Plan Note (Signed)
She has not responded to ketoconazole.  Will refer to dermatology for evaluation.

## 2018-01-07 NOTE — Assessment & Plan Note (Signed)
She will try to complete the sleep study per pulmonology.

## 2018-01-07 NOTE — Progress Notes (Signed)
[name], MD Phone: [phone]  Amanda Snyder is a 22 y.o. female who presents today for f/u.  Patient's mother provides the history.  CC: Obesity, suspected sleep apnea, fatty liver, diabetes, seborrheic dermatitis, PCOS  Obesity: Diet's been difficult recently as they had a family member move into the house to help and it appears they are giving her food they should not be giving her.  No significant exercise.  They did see nutrition in the past.  She knows they need to work on diet and exercise.  Patient saw pulmonology for suspected sleep apnea.  They are arranging a home sleep study.  The patient's mother needs to pick up the device to do this.  It appears they suggested diet and exercise as well as weight loss.  She saw GI and had lab evaluation for fatty liver.  There were no significant abnormalities found.  They were awaiting stool studies though they could not get those done.  Patient continues to intermittently have upper abdominal discomfort.  Seborrheic dermatitis: This continues to be an issue despite use of ketoconazole topical shampoo.  Continues to have flaking in her scalp.  Also has some flaking around her nose.  There is itching.  Diabetes: No polyuria or polydipsia.  They are not checking sugars.  Taking Januvia and metformin.  PCOS: Has irregular menses.  Has not had a menstrual cycle in September.  Social History   Tobacco Use  Smoking Status Never Smoker  Smokeless Tobacco Never Used     ROS see history of present illness  Objective  Physical Exam Vitals:   01/07/18 1552  BP: 122/88  Pulse: (!) 108  Temp: 98.5 F (36.9 C)  SpO2: 97%    BP Readings from Last 3 Encounters:  01/07/18 122/88  12/17/17 (!) 128/93  12/12/17 120/60   Wt Readings from Last 3 Encounters:  01/07/18 288 lb 6.4 oz (130.8 kg)  12/17/17 285 lb 4.8 oz (129.4 kg)  12/12/17 288 lb (130.6 kg)    Physical Exam  Constitutional: No distress.  Cardiovascular:  Regular rhythm and normal heart sounds. Tachycardia present.  Pulmonary/Chest: Effort normal and breath sounds normal.  Musculoskeletal: She exhibits no edema.  Neurological: She is alert.  Skin: Skin is warm and dry. She is not diaphoretic.     Assessment/Plan: Please see individual problem list.  Fatty liver She has seen GI.  I will send them this note to see if they can order blood testing to evaluate for H. pylori.  Diabetes ([place]) She will have an A1c next week.  Continue current regimen.  PCOS (polycystic ovarian syndrome) She will continue with metformin.  I suspect PCOS is the cause of her irregular menses.  I did discuss obtaining urine pregnancy test in the office though the patient's mother declined.  Offered blood pregnancy test though it appears Medicare will not cover this.  The patient's mother noted she would obtain a urine pregnancy test at home.  Offered referral to gynecology for PCOS and the patient's mother will think about this.  Seborrheic dermatitis of scalp She has not responded to ketoconazole.  Will refer to dermatology for evaluation.  Sleep-disordered breathing She will try to complete the sleep study per pulmonology.  Morbid obesity Discussed diet and exercise.   Orders Placed This Encounter  Procedures  . Hemoglobin A1c    Standing Status:   Future    Standing Expiration Date:   01/08/2019  . Urine Microalbumin w/creat. ratio    Standing Status:  Future    Standing Expiration Date:   01/08/2019  . Ambulatory referral to Dermatology    Referral Priority:   Routine    Referral Type:   Consultation    Referral Reason:   Specialty Services Required    Requested Specialty:   Dermatology    Number of Visits Requested:   1    No orders of the defined types were placed in this encounter.    [name], MD [facility]

## 2018-01-07 NOTE — Assessment & Plan Note (Signed)
She will continue with metformin.  I suspect PCOS is the cause of her irregular menses.  I did discuss obtaining urine pregnancy test in the office though the patient's mother declined.  Offered blood pregnancy test though it appears Medicare will not cover this.  The patient's mother noted she would obtain a urine pregnancy test at home.  Offered referral to gynecology for PCOS and the patient's mother will think about this.

## 2018-01-07 NOTE — Assessment & Plan Note (Signed)
She will have an A1c next week.  Continue current regimen.

## 2018-01-07 NOTE — Patient Instructions (Signed)
Nice to see you. We will get you to see dermatology for your scalp. We will check lab work next week as discussed.

## 2018-01-09 ENCOUNTER — Encounter: Payer: Self-pay | Admitting: Family Medicine

## 2018-01-22 ENCOUNTER — Telehealth: Payer: Self-pay | Admitting: Family Medicine

## 2018-01-22 NOTE — Telephone Encounter (Signed)
Please contact the patients mom and let her know that I heard back from GI.  They recommended a blood test for H. pylori.  If the patient's mother is still willing to have this done, which I would recommend having it done, I can place an order and they could have it done at our lab in [place].

## 2018-01-22 NOTE — Telephone Encounter (Signed)
-----   Message from Virgel Manifold, MD sent at 01/21/2018  8:56 AM EDT ----- Hi Dr. Caryl Bis,  Sorry about the delayed response. I was on hospital call for two weeks. Yes serology with the H Pylori Igg, IgM, IgA panel would work. Thank You!  VT ----- Message ----- From: Leone Haven, MD Sent: 01/07/2018   5:20 PM EDT To: Virgel Manifold, MD  Hi Dr Bonna Gains,   I saw [facility] today for follow-up. Her mother reported that she was unable to complete the stool testing that you ordered previously. It appears that you mentioned possibly completing h pylori blood work. Would you still want to proceed with that? If so, I can order it to be done at our lab so she can have it done all at once.   Randall Hiss

## 2018-01-22 NOTE — Telephone Encounter (Signed)
Called pt and left a VM to call back. CRM created and sent to [facility]

## 2018-01-29 NOTE — Telephone Encounter (Signed)
Called pt and left a detailed VM advised for pt's mother to give [place] a call back. CRM created and sent to [facility] pool.

## 2018-02-02 NOTE — Telephone Encounter (Signed)
Called pt's mother and left a VM to call back UTR letter has been placed to be mailed.

## 2018-02-11 DIAGNOSIS — R0683 Snoring: Secondary | ICD-10-CM | POA: Diagnosis not present

## 2018-02-11 DIAGNOSIS — G4730 Sleep apnea, unspecified: Secondary | ICD-10-CM | POA: Diagnosis not present

## 2018-02-12 ENCOUNTER — Other Ambulatory Visit: Payer: Self-pay | Admitting: *Deleted

## 2018-02-12 DIAGNOSIS — R0683 Snoring: Secondary | ICD-10-CM

## 2018-02-16 DIAGNOSIS — G4730 Sleep apnea, unspecified: Secondary | ICD-10-CM | POA: Diagnosis not present

## 2018-02-18 ENCOUNTER — Telehealth: Payer: Self-pay | Admitting: Pulmonary Disease

## 2018-02-18 NOTE — Telephone Encounter (Signed)
Dr. Ander Slade has reviewed the home sleep test this showed Suboptimal study for evaluation of sleep disordered breathing. Central  Sleep apnea was present during study ,However, there was a lot of artifacts on the recording   Recommendations   Patient with behavioral problems and may not be able to fully cooperate with monitoring to obtain a decent interpretable study. In lab study may be considered if pretest for sleep disordered breathing is high, behavioral factors need to be considered prior to scheduling study.  Weight loss if able. Lateral sleep positioning and elevation of head should be considered.  Make appointment for follow up  with Dr. Ander Slade.   Atc the mom but was unable to reach I let a voice mail for he to call back.

## 2018-02-25 NOTE — Telephone Encounter (Signed)
Patients mom verbalized understanding and did not want follow at this time.

## 2018-03-04 ENCOUNTER — Other Ambulatory Visit: Payer: Self-pay | Admitting: Family Medicine

## 2018-05-13 ENCOUNTER — Other Ambulatory Visit
Admission: RE | Admit: 2018-05-13 | Discharge: 2018-05-13 | Disposition: A | Discharge status: Home / Self Care | Payer: Medicare Other | Source: Ambulatory Visit | Attending: Family Medicine | Admitting: Family Medicine

## 2018-05-13 ENCOUNTER — Encounter: Payer: Self-pay | Admitting: Family Medicine

## 2018-05-13 ENCOUNTER — Ambulatory Visit (INDEPENDENT_AMBULATORY_CARE_PROVIDER_SITE_OTHER): Payer: Medicare Other | Admitting: Family Medicine

## 2018-05-13 VITALS — BP 110/64 | HR 58 | Ht 61.5 in | Wt 289.4 lb

## 2018-05-13 DIAGNOSIS — E119 Type 2 diabetes mellitus without complications: Secondary | ICD-10-CM

## 2018-05-13 DIAGNOSIS — M7989 Other specified soft tissue disorders: Secondary | ICD-10-CM | POA: Insufficient documentation

## 2018-05-13 DIAGNOSIS — R350 Frequency of micturition: Secondary | ICD-10-CM | POA: Insufficient documentation

## 2018-05-13 DIAGNOSIS — E282 Polycystic ovarian syndrome: Secondary | ICD-10-CM | POA: Diagnosis not present

## 2018-05-13 LAB — FIBRIN DERIVATIVES D-DIMER (ARMC ONLY): Fibrin derivatives D-dimer (ARMC): 316.35 ng/mL (FEU) (ref 0.00–499.00)

## 2018-05-13 LAB — HEMOGLOBIN A1C
Hgb A1c MFr Bld: 6.2 % — ABNORMAL HIGH (ref 4.8–5.6)
Mean Plasma Glucose: 131.24 mg/dL

## 2018-05-13 NOTE — Progress Notes (Signed)
[name], MD Phone: [phone]  Amanda Snyder is a 23 y.o. female who presents today for f/u.  CC: diabetes, pcos, left arm bigger than right, obesity  Diabetes: Has noted some increased urination recently.  She is taking metformin.  No polydipsia.  No hypoglycemia.  Notes no dysuria, urgency, or hematuria.  Did note her urine was dark several days ago.  PCOS: Currently on metformin for this.  Has not been evaluated by GYN in some time.  Needs to reestablish with GYN.  Left arm bigger than right arm: Patient's mother notes she noticed her left upper arm was bigger in size in her right upper arm.  Notes there is no difference in size distal to the elbow.  She noted this several months ago.  She notes the patient does not complain of pain.  The patient has not complained of anything.  Obesity: The patient's mom has a hard time getting the patient to eat healthy.  The patient likes fast food and salsaritas.  She is not exercising.  Social History   Tobacco Use  Smoking Status Never Smoker  Smokeless Tobacco Never Used     ROS see history of present illness  Objective  Physical Exam Vitals:   05/13/18 1551  BP: 110/64  Pulse: (!) 58  SpO2: 99%    BP Readings from Last 3 Encounters:  05/13/18 110/64  01/07/18 122/88  12/17/17 (!) 128/93   Wt Readings from Last 3 Encounters:  05/13/18 289 lb 6.4 oz (131.3 kg)  01/07/18 288 lb 6.4 oz (130.8 kg)  12/17/17 285 lb 4.8 oz (129.4 kg)    Physical Exam Constitutional:      General: She is not in acute distress.    Appearance: She is not diaphoretic.  Cardiovascular:     Rate and Rhythm: Normal rate and regular rhythm.     Heart sounds: Normal heart sounds.  Pulmonary:     Effort: Pulmonary effort is normal.     Breath sounds: Normal breath sounds.  Musculoskeletal:     Comments: Chaperone used, patient's mother had to remove the patient shirt to view her upper arms, left upper arm does appear to be greater in  size than the right upper arm, on palpation this seems to be mostly fatty tissue, no apparent tenderness, bilateral forearms appear to be the same size, 2+ radial pulses  Skin:    General: Skin is warm and dry.  Neurological:     Mental Status: She is alert.      Assessment/Plan: Please see individual problem list.  Diabetes ([place]) Continue current medication.  Check A1c.  Left upper extremity swelling This may be related to difference in use of arms with increased musculature though given the size difference we will obtain a d-dimer to rule out blood clot.  I have a low pretest probability for blood clot.  Discussed that if this is positive the patient will need an ultrasound.  Urine frequency They will return with a urine sample.  PCOS (polycystic ovarian syndrome) Refer to GYN.  Morbid obesity Weight is stable.  Encouraged activity and monitoring diet.   Orders Placed This Encounter  Procedures  . Fibrin derivatives D-Dimer ([facility] only)    Standing Status:   Future    Number of Occurrences:   1    Standing Expiration Date:   05/14/2019  . HgB A1c    Standing Status:   Future    Number of Occurrences:   1    Standing Expiration  Date:   05/14/2019  . Ambulatory referral to Gynecology    Referral Priority:   Routine    Referral Type:   Consultation    Referral Reason:   Specialty Services Required    Requested Specialty:   Gynecology    Number of Visits Requested:   1  . POCT urinalysis dipstick    Standing Status:   Future    Standing Expiration Date:   07/12/2018    No orders of the defined types were placed in this encounter.    [name], MD [place]

## 2018-05-13 NOTE — Assessment & Plan Note (Signed)
Weight is stable.  Encouraged activity and monitoring diet.

## 2018-05-13 NOTE — Assessment & Plan Note (Signed)
This may be related to difference in use of arms with increased musculature though given the size difference we will obtain a d-dimer to rule out blood clot.  I have a low pretest probability for blood clot.  Discussed that if this is positive the patient will need an ultrasound.

## 2018-05-13 NOTE — Assessment & Plan Note (Signed)
Refer to GYN.

## 2018-05-13 NOTE — Patient Instructions (Signed)
Nice to see you. We will have you go to the lab at [facility] to have labs drawn tonight. We will get you referred to a different gynecologist. Please collect a urine and drop it off at our lab at [facility]. If she develops chest pain or trouble breathing please take her to the emergency room.

## 2018-05-13 NOTE — Assessment & Plan Note (Signed)
Continue current medication.  Check A1c.

## 2018-05-13 NOTE — Assessment & Plan Note (Signed)
They will return with a urine sample.

## 2018-05-29 ENCOUNTER — Telehealth: Payer: Self-pay | Admitting: Family Medicine

## 2018-05-29 NOTE — Telephone Encounter (Signed)
Copied from [place] [phone]. Topic: Quick Communication - See Telephone Encounter >> May 29, 2018  9:42 AM Margot Ables wrote: CRM for notification. See Telephone encounter for: 05/29/18. Nita called to f/u on fax sent 05/21/2018 to [phone]. This is a renew for incontinence supplies so pt is able to continue receiving diapers, under pads, and gloves. Laverta Baltimore is refaxing today to [phone]. Please call her if not received at ph# [phone].

## 2018-06-17 ENCOUNTER — Other Ambulatory Visit: Payer: Medicare Other

## 2018-06-17 DIAGNOSIS — E119 Type 2 diabetes mellitus without complications: Secondary | ICD-10-CM | POA: Diagnosis not present

## 2018-06-17 LAB — MICROALBUMIN / CREATININE URINE RATIO
Creatinine,U: 178.2 mg/dL
Microalb Creat Ratio: 0.5 mg/g (ref 0.0–30.0)
Microalb, Ur: 0.9 mg/dL (ref 0.0–1.9)

## 2018-06-24 ENCOUNTER — Ambulatory Visit: Payer: Medicare Other | Admitting: Gynecology

## 2018-09-11 ENCOUNTER — Other Ambulatory Visit: Payer: Self-pay

## 2018-09-11 ENCOUNTER — Ambulatory Visit (INDEPENDENT_AMBULATORY_CARE_PROVIDER_SITE_OTHER): Payer: Medicare Other | Admitting: Family Medicine

## 2018-09-11 ENCOUNTER — Encounter: Payer: Self-pay | Admitting: Family Medicine

## 2018-09-11 DIAGNOSIS — E282 Polycystic ovarian syndrome: Secondary | ICD-10-CM

## 2018-09-11 DIAGNOSIS — R1011 Right upper quadrant pain: Secondary | ICD-10-CM

## 2018-09-11 DIAGNOSIS — E119 Type 2 diabetes mellitus without complications: Secondary | ICD-10-CM | POA: Diagnosis not present

## 2018-09-11 DIAGNOSIS — L219 Seborrheic dermatitis, unspecified: Secondary | ICD-10-CM

## 2018-09-11 NOTE — Assessment & Plan Note (Signed)
Well-controlled on last check.  They will continue with metformin and Januvia.  We can plan on checking an A1c at her next visit in 3 months.

## 2018-09-11 NOTE — Assessment & Plan Note (Signed)
Improved.  They will continue to monitor.

## 2018-09-11 NOTE — Assessment & Plan Note (Signed)
No significant issues with this.  She has been evaluated by GI and underwent right upper quadrant ultrasound.  They will monitor.

## 2018-09-11 NOTE — Progress Notes (Signed)
Virtual Visit via video Note  This visit type was conducted due to national recommendations for restrictions regarding the COVID-19 pandemic (e.g. social distancing).  This format is felt to be most appropriate for this patient at this time.  All issues noted in this document were discussed and addressed.  No physical exam was performed (except for noted visual exam findings with Video Visits).   I connected with Amanda Snyder today at  4:00 PM EDT by a video enabled telemedicine application and verified that I am speaking with the correct person using two identifiers. Location patient: home Location provider: work or home office Persons participating in the virtual visit: patient, provider, Caroline Longie (mom) provides the entirety of the history as the patient has severe developmental disability  I discussed the limitations, risks, security and privacy concerns of performing an evaluation and management service by telephone and the availability of in person appointments. I also discussed with the patient that there may be a patient responsible charge related to this service. The patient expressed understanding and agreed to proceed.   Reason for visit: follow-up  HPI: Diabetes: Not checking sugars.  Taking metformin and Januvia.  No polyuria or polydipsia.  No hypoglycemia.  PCOS: She held off on scheduling the GYN visit given COVID-19 pandemic.  She will contact them to get this set up for sometime in July or August.  Seborrheic dermatitis: The patient did not see dermatology.  Her mother did take her though the patient would not get out of the car.  Patient's mother has been changing her hair style and working with her scalp and she does not have any recurrent issues.  Right upper quadrant pain: This has been evaluated by GI.  There is been no obvious cause found.  Her mom notes it seems to happen when she is hungry or when she has to have a bowel movement.  No vomiting or  diarrhea.  No blood in her stool.  The patient's mother reports the patient is doing quite well.  She has not noticed any significant symptoms.  The patient has not been out in about other than being in the car.   ROS: See pertinent positives and negatives per HPI.  Past Medical History:  Diagnosis Date  . ADHD (attention deficit hyperactivity disorder) 2000   mom opted not to give medications (failed Adderall - 'zombielike', failed Strattera - 'no difference')  . Allergy    suspected allergy to pollen or dog(s)  . Anemia   . Diabetes mellitus without complication ([place])   . Hypertension   . Mental retardation 1997   Severe MR. did not walk until age 87-3 years, received Early Intervention.  . Obesity 12  . Premature birth with gestation of 35-36 weeks    [redacted] weeks GA  . Seizures ([place]) From age 70 month to 6 months   took Phenobarbital for a few years. weaned off after moving from [place] to [place]. Unknown etiology.  . Syncope 2006   witnessed by sister, no workup    Past Surgical History:  Procedure Laterality Date  . NO PAST SURGERIES      Family History  Problem Relation Age of Onset  . Stroke Mother 32  . Psoriasis Mother   . Alcohol abuse Mother   . Hyperlipidemia Mother   . Mental retardation Mother   . Hypertension Mother   . Alcohol abuse Brother   . Other Father        Died from gunshot wound.  Marland Kitchen  Cancer Maternal Grandmother 48       breast ca  . Breast cancer Maternal Grandmother   . Alcohol abuse Maternal Grandfather   . Diabetes Paternal Grandmother     SOCIAL HX: Non-smoker.   Current Outpatient Medications:  .  cetirizine (ZYRTEC) 10 MG tablet, Take 1 tablet (10 mg total) by mouth daily. (Patient taking differently: Take 10 mg by mouth as needed. ), Disp: 30 tablet, Rfl: 11 .  EPINEPHrine (EPIPEN 2-PAK) 0.3 mg/0.3 mL IJ SOAJ injection, Inject 0.3 mLs (0.3 mg total) into the muscle once as needed (anaphylaxis)., Disp: 1 Device, Rfl: 0 .  JANUVIA 100 MG tablet,  TAKE 1 TABLET BY MOUTH EVERY DAY, Disp: 90 tablet, Rfl: 1 .  ketoconazole (NIZORAL) 2 % shampoo, Apply 1 application topically 2 (two) times a week. For 2-4 weeks, Disp: 120 mL, Rfl: 0 .  metFORMIN (GLUCOPHAGE-XR) 500 MG 24 hr tablet, TAKE 3 TABLETS (1,500 MG TOTAL) BY MOUTH DAILY WITH BREAKFAST., Disp: 270 tablet, Rfl: 1  EXAM:  VITALS per patient if applicable: None.  GENERAL: alert, oriented, appears well and in no acute distress  HEENT: atraumatic, conjunttiva clear, no obvious abnormalities on inspection of external nose and ears  NECK: normal movements of the head and neck  LUNGS: on inspection no signs of respiratory distress, breathing rate appears normal, no obvious gross SOB, gasping or wheezing  CV: no obvious cyanosis  MS: moves all visible extremities without noticeable abnormality  PSYCH/NEURO: pleasant and cooperative, no obvious depression or anxiety, speech and thought processing grossly intact  ASSESSMENT AND PLAN:  Discussed the following assessment and plan:  Abdominal pain, right upper quadrant  Type 2 diabetes mellitus without complication, without long-term current use of insulin (HCC)  PCOS (polycystic ovarian syndrome)  Seborrheic dermatitis of scalp  Abdominal pain, right upper quadrant No significant issues with this.  She has been evaluated by GI and underwent right upper quadrant ultrasound.  They will monitor.  Diabetes ([place]) Well-controlled on last check.  They will continue with metformin and Januvia.  We can plan on checking an A1c at her next visit in 3 months.  PCOS (polycystic ovarian syndrome) I encouraged the patient's mother to contact GYN to try to schedule an appointment as it may be difficult to get an appointment.  Seborrheic dermatitis of scalp Improved.  They will continue to monitor.  [place] office staff will contact the patient's mother to get her scheduled for 76-monthfollow-up.  Social distancing precautions and sick  precautions given regarding COVID-19.   I discussed the assessment and treatment plan with the patient. The patient was provided an opportunity to ask questions and all were answered. The patient agreed with the plan and demonstrated an understanding of the instructions.   The patient was advised to call back or seek an in-person evaluation if the symptoms worsen or if the condition fails to improve as anticipated.   ETommi Rumps MD

## 2018-09-11 NOTE — Assessment & Plan Note (Signed)
I encouraged the patient's mother to contact GYN to try to schedule an appointment as it may be difficult to get an appointment.

## 2018-11-30 ENCOUNTER — Encounter: Payer: Self-pay | Admitting: Family Medicine

## 2018-12-01 ENCOUNTER — Encounter: Payer: Self-pay | Admitting: Family Medicine

## 2018-12-02 ENCOUNTER — Ambulatory Visit (INDEPENDENT_AMBULATORY_CARE_PROVIDER_SITE_OTHER): Payer: Medicare Other

## 2018-12-02 ENCOUNTER — Other Ambulatory Visit: Payer: Self-pay

## 2018-12-02 ENCOUNTER — Ambulatory Visit (INDEPENDENT_AMBULATORY_CARE_PROVIDER_SITE_OTHER): Payer: Medicare Other | Admitting: Family Medicine

## 2018-12-02 ENCOUNTER — Encounter: Payer: Self-pay | Admitting: Family Medicine

## 2018-12-02 VITALS — BP 130/70 | HR 105 | Temp 97.7°F | Ht 61.0 in | Wt 298.6 lb

## 2018-12-02 DIAGNOSIS — M25562 Pain in left knee: Secondary | ICD-10-CM | POA: Diagnosis not present

## 2018-12-02 MED ORDER — SITAGLIPTIN PHOSPHATE 100 MG PO TABS: 100.0000 mg | ORAL_TABLET | Freq: Every day | ORAL | 1 refills | Status: DC

## 2018-12-02 NOTE — Progress Notes (Signed)
Subjective:    Patient ID: Amanda Snyder, female    DOB: 07/21/1995, 23 y.o.   MRN: [id number]  HPI  Patient presents to clinic with her mom due to pain in left leg/knee.  Patient is developmentally disabled, so we are unable to get a good pain history/injury history from patient.  As far patient's mother knows, she did not fall.  Mom states either she or her mood is always with patient and no one saw her fall.  Mom states that over the weekend she noticed the knee was red and swollen and patient was screaming out in pain.  Mom was nervous, so called 911 and EMS came and assessed the leg.  EMS did not have strong feelings towards blood clot, and both mom and EMS decided against transferring her to ER due to her otherwise appearing stable.  Mom states she gave her ibuprofen and had her elevate the leg up.  Over the next couple of days, redness and swelling seemed to subside and patient began walking more more on her own without seeming to be in pain or yelling out in pain.  Today the knee seems much better, does not appear red or swollen.  Patient appears to be walking back to her baseline.  Patient also seems to be acting like her normal self.  Mom tried rubbing topical things on the knee like Aspercreme and this seemed to help soothe the pain.  Tried putting a knee brace on patient however patient did not like wearing the knee brace and took it off herself.   Patient Active Problem List   Diagnosis Date Noted  . Left upper extremity swelling 05/13/2018  . Urine frequency 05/13/2018  . Sleep-disordered breathing 01/07/2018  . Thrombocytosis ([place]) 12/17/2017  . Nondisplaced fracture of proximal phalanx of right great toe, initial encounter for closed fracture 10/06/2017  . Abdominal discomfort 09/24/2017  . Seborrheic dermatitis of scalp 09/24/2017  . Right foot pain 09/24/2017  . Tachycardia 12/19/2016  . Irregular menses 12/19/2016  . Fatty liver 01/24/2016  . Lipoma 01/24/2016  .  History of seizures 01/24/2016  . Menorrhagia with regular cycle 08/16/2015  . Abdominal pain, right upper quadrant 08/16/2015  . Dental anomaly 08/16/2015  . Iron deficiency anemia 08/03/2014  . Vitamin D deficiency 05/20/2014  . Exotropia of left eye 03/17/2014  . Anaphylaxis due to food 11/11/2013  . Morbid obesity ([place]) 11/11/2013  . PCOS (polycystic ovarian syndrome) 11/11/2013  . Elevated blood pressure reading without diagnosis of hypertension 08/25/2013  . Diabetes ([place]) 05/28/2013  . Incontinence 09/06/2011  . Kyphosis 08/08/2010  . Severe intellectual disabilities 12/18/2006   Social History   Tobacco Use  . Smoking status: Never Smoker  . Smokeless tobacco: Never Used  Substance Use Topics  . Alcohol use: No    Alcohol/week: 0.0 standard drinks   Review of Systems  Constitutional: Negative for chills, fatigue and fever.  HENT: Negative for congestion Eyes: Negative.   Respiratory: Negative for cough, shortness of breath and wheezing.   Cardiovascular: Negative for chest pain, and leg swelling.  Gastrointestinal: Negative for diarrhea, and vomiting.  Genitourinary: Negative for dysuria, frequency.  Musculoskeletal: +left knee/leg pain Skin: Negative for color change, pallor and rash.  Neurological: Negative for syncope, functioning at her baseline  Psychiatric/Behavioral: at her baseline     Objective:   Physical Exam Vitals signs and nursing note reviewed.  Constitutional:      General: She is not in acute distress.    Appearance:  She is not toxic-appearing or diaphoretic.  HENT:     Head: Normocephalic and atraumatic.  Musculoskeletal:     Right lower leg: No edema.     Left lower leg: No edema.     Comments: Patient pushes my hand away when he tried to straighten out left leg to full extension.  Patient also seems to not like knee being fully flexed.  Grimaces when I palpate over the patella and off to the left side of left knee cap.   Does not appear  to grimace or pull away in pain when posterior left knee palpated.  DP pulse normal  Skin:    General: Skin is warm and dry.     Capillary Refill: Capillary refill takes less than 2 seconds.     Coloration: Skin is not jaundiced or pale.     Findings: No bruising or erythema.  Neurological:     Mental Status: She is alert. Mental status is at baseline.     Comments: Does not appear to limp when walking.     Today's Vitals   12/02/18 1106  BP: 130/70  Pulse: (!) 105  Temp: 97.7 F (36.5 C)  TempSrc: Oral  SpO2: 97%  Weight: 298 lb 9.6 oz (135.4 kg)  Height: 5' 1"  (1.549 m)   Body mass index is 56.42 kg/m.     Assessment & Plan:    Left knee pain - we will get x-ray of knee in clinic.  Strongly suspect patient had some sort of sprain or inflammation in left knee that has responded to resting, topical rubs and ibuprofen.  If pain continues or swelling and redness returns, mom and I discussed option of getting Doppler study to rule out blood clot.  However, due to swelling, redness and patient's walking ability returning back to normal with the treatment plan that was already used, I do not have very strong suspicion for blood clot & well as her WELLS criteria being low risk for DVT.  Patient will keep all of her regular follow-ups with PCP as planned.  Mom aware that if pain worsens, she can call office anytime and or take patient to urgent care/ER if absolutely needed.  Mom also aware she can continue to use topical rubs and ibuprofen as needed for pain.

## 2018-12-02 NOTE — Patient Instructions (Addendum)
You can use topical rubs like the aspercreme or diclofenac  Oral ibuprofen 600 mg 3x per day day with food as needed can help pain  If pain persists, we can consider doppler and lab to rule out DVT

## 2018-12-04 ENCOUNTER — Encounter: Payer: Self-pay | Admitting: Family Medicine

## 2018-12-04 ENCOUNTER — Ambulatory Visit (HOSPITAL_COMMUNITY): Payer: Medicare Other

## 2018-12-04 ENCOUNTER — Ambulatory Visit (HOSPITAL_COMMUNITY): Payer: Medicare Other | Attending: Family Medicine

## 2018-12-04 ENCOUNTER — Other Ambulatory Visit: Payer: Self-pay | Admitting: Family Medicine

## 2018-12-04 DIAGNOSIS — M7989 Other specified soft tissue disorders: Secondary | ICD-10-CM

## 2018-12-07 NOTE — Telephone Encounter (Signed)
Amanda Snyder, Please call mom --- we had 2 appt times set up for Friday for [place] of leg to look for DVT; not sure if we can set up another doppler appt for today?  I do not have strong suspicion for DVT, pain can be musculoskeletal. The best way to rule out DVT is with the doppler.  We can also set her up with orthopedic or sports medicine to evaluate joint further also

## 2018-12-13 ENCOUNTER — Encounter: Payer: Self-pay | Admitting: Family Medicine

## 2018-12-15 NOTE — Telephone Encounter (Signed)
Secure message sent to [facility] regarding scheduling the [place].

## 2018-12-16 ENCOUNTER — Other Ambulatory Visit: Payer: Self-pay

## 2018-12-16 ENCOUNTER — Ambulatory Visit (HOSPITAL_BASED_OUTPATIENT_CLINIC_OR_DEPARTMENT_OTHER)
Admission: RE | Admit: 2018-12-16 | Discharge: 2018-12-16 | Disposition: A | Discharge status: Home / Self Care | Payer: Medicare Other | Source: Ambulatory Visit | Attending: Family Medicine | Admitting: Family Medicine

## 2018-12-16 DIAGNOSIS — M7989 Other specified soft tissue disorders: Secondary | ICD-10-CM | POA: Insufficient documentation

## 2018-12-28 ENCOUNTER — Encounter: Payer: Self-pay | Admitting: Gynecology

## 2019-01-18 ENCOUNTER — Encounter: Payer: Self-pay | Admitting: Family Medicine

## 2019-01-19 MED ORDER — METFORMIN HCL 850 MG PO TABS: 850.0000 mg | ORAL_TABLET | Freq: Two times a day (BID) | ORAL | 1 refills | Status: DC

## 2019-01-27 ENCOUNTER — Encounter: Payer: Self-pay | Admitting: Family Medicine

## 2019-05-31 ENCOUNTER — Other Ambulatory Visit: Payer: Self-pay | Admitting: Family Medicine

## 2019-06-14 ENCOUNTER — Other Ambulatory Visit: Payer: Self-pay

## 2019-06-14 ENCOUNTER — Encounter (HOSPITAL_COMMUNITY): Payer: Self-pay

## 2019-06-14 ENCOUNTER — Encounter (HOSPITAL_COMMUNITY): Payer: Self-pay | Admitting: Emergency Medicine

## 2019-06-14 ENCOUNTER — Inpatient Hospital Stay (HOSPITAL_COMMUNITY)
Admission: EM | Admit: 2019-06-14 | Discharge: 2019-06-19 | DRG: 638 | Disposition: A | Discharge status: Home / Self Care | Payer: Medicare Other | Source: Ambulatory Visit | Attending: Internal Medicine | Admitting: Internal Medicine

## 2019-06-14 ENCOUNTER — Emergency Department (HOSPITAL_COMMUNITY): Payer: Medicare Other

## 2019-06-14 ENCOUNTER — Encounter: Payer: Self-pay | Admitting: Family Medicine

## 2019-06-14 ENCOUNTER — Ambulatory Visit (INDEPENDENT_AMBULATORY_CARE_PROVIDER_SITE_OTHER)
Admission: EM | Admit: 2019-06-14 | Discharge: 2019-06-14 | Disposition: A | Discharge status: Home / Self Care | Payer: Medicare Other | Source: Home / Self Care | Attending: Family Medicine | Admitting: Family Medicine

## 2019-06-14 ENCOUNTER — Telehealth (INDEPENDENT_AMBULATORY_CARE_PROVIDER_SITE_OTHER): Payer: Medicare Other | Admitting: Family Medicine

## 2019-06-14 DIAGNOSIS — F72 Severe intellectual disabilities: Secondary | ICD-10-CM | POA: Diagnosis present

## 2019-06-14 DIAGNOSIS — Z3202 Encounter for pregnancy test, result negative: Secondary | ICD-10-CM | POA: Diagnosis not present

## 2019-06-14 DIAGNOSIS — Z810 Family history of intellectual disabilities: Secondary | ICD-10-CM

## 2019-06-14 DIAGNOSIS — Z803 Family history of malignant neoplasm of breast: Secondary | ICD-10-CM

## 2019-06-14 DIAGNOSIS — E0865 Diabetes mellitus due to underlying condition with hyperglycemia: Secondary | ICD-10-CM | POA: Diagnosis not present

## 2019-06-14 DIAGNOSIS — R000 Tachycardia, unspecified: Secondary | ICD-10-CM | POA: Diagnosis not present

## 2019-06-14 DIAGNOSIS — R1013 Epigastric pain: Secondary | ICD-10-CM | POA: Diagnosis not present

## 2019-06-14 DIAGNOSIS — R739 Hyperglycemia, unspecified: Secondary | ICD-10-CM

## 2019-06-14 DIAGNOSIS — Z7984 Long term (current) use of oral hypoglycemic drugs: Secondary | ICD-10-CM

## 2019-06-14 DIAGNOSIS — Z6841 Body Mass Index (BMI) 40.0 and over, adult: Secondary | ICD-10-CM

## 2019-06-14 DIAGNOSIS — R9431 Abnormal electrocardiogram [ECG] [EKG]: Secondary | ICD-10-CM | POA: Diagnosis present

## 2019-06-14 DIAGNOSIS — K5900 Constipation, unspecified: Secondary | ICD-10-CM | POA: Diagnosis present

## 2019-06-14 DIAGNOSIS — Z833 Family history of diabetes mellitus: Secondary | ICD-10-CM

## 2019-06-14 DIAGNOSIS — Z8249 Family history of ischemic heart disease and other diseases of the circulatory system: Secondary | ICD-10-CM

## 2019-06-14 DIAGNOSIS — R945 Abnormal results of liver function studies: Secondary | ICD-10-CM | POA: Diagnosis present

## 2019-06-14 DIAGNOSIS — R1084 Generalized abdominal pain: Secondary | ICD-10-CM

## 2019-06-14 DIAGNOSIS — E1165 Type 2 diabetes mellitus with hyperglycemia: Secondary | ICD-10-CM | POA: Diagnosis not present

## 2019-06-14 DIAGNOSIS — G40909 Epilepsy, unspecified, not intractable, without status epilepticus: Secondary | ICD-10-CM | POA: Diagnosis present

## 2019-06-14 DIAGNOSIS — Z823 Family history of stroke: Secondary | ICD-10-CM

## 2019-06-14 DIAGNOSIS — Z03818 Encounter for observation for suspected exposure to other biological agents ruled out: Secondary | ICD-10-CM | POA: Diagnosis not present

## 2019-06-14 DIAGNOSIS — R1110 Vomiting, unspecified: Secondary | ICD-10-CM | POA: Diagnosis not present

## 2019-06-14 DIAGNOSIS — Z20822 Contact with and (suspected) exposure to covid-19: Secondary | ICD-10-CM | POA: Diagnosis present

## 2019-06-14 DIAGNOSIS — E876 Hypokalemia: Secondary | ICD-10-CM | POA: Diagnosis present

## 2019-06-14 DIAGNOSIS — E0810 Diabetes mellitus due to underlying condition with ketoacidosis without coma: Secondary | ICD-10-CM

## 2019-06-14 DIAGNOSIS — R0600 Dyspnea, unspecified: Secondary | ICD-10-CM

## 2019-06-14 DIAGNOSIS — E1110 Type 2 diabetes mellitus with ketoacidosis without coma: Secondary | ICD-10-CM | POA: Diagnosis not present

## 2019-06-14 DIAGNOSIS — R4182 Altered mental status, unspecified: Secondary | ICD-10-CM

## 2019-06-14 DIAGNOSIS — E860 Dehydration: Secondary | ICD-10-CM | POA: Diagnosis present

## 2019-06-14 DIAGNOSIS — Z8349 Family history of other endocrine, nutritional and metabolic diseases: Secondary | ICD-10-CM

## 2019-06-14 DIAGNOSIS — R32 Unspecified urinary incontinence: Secondary | ICD-10-CM | POA: Diagnosis not present

## 2019-06-14 DIAGNOSIS — K760 Fatty (change of) liver, not elsewhere classified: Secondary | ICD-10-CM | POA: Diagnosis present

## 2019-06-14 DIAGNOSIS — E871 Hypo-osmolality and hyponatremia: Secondary | ICD-10-CM | POA: Diagnosis present

## 2019-06-14 DIAGNOSIS — D509 Iron deficiency anemia, unspecified: Secondary | ICD-10-CM | POA: Diagnosis not present

## 2019-06-14 DIAGNOSIS — D72829 Elevated white blood cell count, unspecified: Secondary | ICD-10-CM | POA: Diagnosis present

## 2019-06-14 LAB — CBG MONITORING, ED
Glucose-Capillary: 336 mg/dL — ABNORMAL HIGH (ref 70–99)
Glucose-Capillary: 447 mg/dL — ABNORMAL HIGH (ref 70–99)
Glucose-Capillary: 566 mg/dL (ref 70–99)
Glucose-Capillary: >600 mg/dL (ref 70–99)

## 2019-06-14 LAB — POCT URINALYSIS DIP (DEVICE)
Bilirubin Urine: NEGATIVE
Glucose, UA: >=1000 mg/dL — AB
Ketones, ur: NEGATIVE mg/dL
Leukocytes,Ua: NEGATIVE
Nitrite: NEGATIVE
Protein, ur: 30 mg/dL — AB
Specific Gravity, Urine: <=1.005 (ref 1.005–1.030)
Urobilinogen, UA: 0.2 mg/dL (ref 0.0–1.0)
pH: 5 (ref 5.0–8.0)

## 2019-06-14 LAB — POCT I-STAT EG7
Acid-base deficit: 1 mmol/L (ref 0.0–2.0)
Bicarbonate: 23.7 mmol/L (ref 20.0–28.0)
Calcium, Ion: 1.06 mmol/L — ABNORMAL LOW (ref 1.15–1.40)
HCT: 48 % — ABNORMAL HIGH (ref 36.0–46.0)
Hemoglobin: 16.3 g/dL — ABNORMAL HIGH (ref 12.0–15.0)
O2 Saturation: 92 %
Patient temperature: 37
Potassium: 3.9 mmol/L (ref 3.5–5.1)
Sodium: 133 mmol/L — ABNORMAL LOW (ref 135–145)
TCO2: 25 mmol/L (ref 22–32)
pCO2, Ven: 37.1 mmHg — ABNORMAL LOW (ref 44.0–60.0)
pH, Ven: 7.413 (ref 7.250–7.430)
pO2, Ven: 64 mmHg — ABNORMAL HIGH (ref 32.0–45.0)

## 2019-06-14 LAB — CBC
HCT: 45 % (ref 36.0–46.0)
Hemoglobin: 13.7 g/dL (ref 12.0–15.0)
MCH: 22.1 pg — ABNORMAL LOW (ref 26.0–34.0)
MCHC: 30.4 g/dL (ref 30.0–36.0)
MCV: 72.6 fL — ABNORMAL LOW (ref 80.0–100.0)
Platelets: 408 10*3/uL — ABNORMAL HIGH (ref 150–400)
RBC: 6.2 MIL/uL — ABNORMAL HIGH (ref 3.87–5.11)
RDW: 14.5 % (ref 11.5–15.5)
WBC: 11 10*3/uL — ABNORMAL HIGH (ref 4.0–10.5)
nRBC: 0 % (ref 0.0–0.2)

## 2019-06-14 LAB — COMPREHENSIVE METABOLIC PANEL
ALT: 81 U/L — ABNORMAL HIGH (ref 0–44)
AST: 53 U/L — ABNORMAL HIGH (ref 15–41)
Albumin: 4 g/dL (ref 3.5–5.0)
Alkaline Phosphatase: 83 U/L (ref 38–126)
Anion gap: 16 — ABNORMAL HIGH (ref 5–15)
BUN: 9 mg/dL (ref 6–20)
CO2: 21 mmol/L — ABNORMAL LOW (ref 22–32)
Calcium: 9.1 mg/dL (ref 8.9–10.3)
Chloride: 95 mmol/L — ABNORMAL LOW (ref 98–111)
Creatinine, Ser: 0.86 mg/dL (ref 0.44–1.00)
GFR calc Af Amer: >60 mL/min (ref >60)
GFR calc non Af Amer: >60 mL/min (ref >60)
Glucose, Bld: 550 mg/dL (ref 70–99)
Potassium: 4.1 mmol/L (ref 3.5–5.1)
Sodium: 132 mmol/L — ABNORMAL LOW (ref 135–145)
Total Bilirubin: 0.7 mg/dL (ref 0.3–1.2)
Total Protein: 8 g/dL (ref 6.5–8.1)

## 2019-06-14 LAB — GLUCOSE, CAPILLARY: Glucose-Capillary: 566 mg/dL (ref 70–99)

## 2019-06-14 LAB — POC URINE PREG, ED: Preg Test, Ur: NEGATIVE

## 2019-06-14 LAB — I-STAT BETA HCG BLOOD, ED (MC, WL, AP ONLY): I-stat hCG, quantitative: <5 m[IU]/mL (ref <5)

## 2019-06-14 LAB — LIPASE, BLOOD: Lipase: 22 U/L (ref 11–51)

## 2019-06-14 LAB — POCT PREGNANCY, URINE: Preg Test, Ur: NEGATIVE

## 2019-06-14 LAB — BETA-HYDROXYBUTYRIC ACID: Beta-Hydroxybutyric Acid: 0.7 mmol/L — ABNORMAL HIGH (ref 0.05–0.27)

## 2019-06-14 MED ORDER — IOHEXOL 350 MG/ML SOLN: 100.0000 mL | Freq: Once | INTRAVENOUS | Status: DC | PRN

## 2019-06-14 MED ORDER — DEXTROSE 50 % IV SOLN: 0.0000 mL | INTRAVENOUS | Status: DC | PRN

## 2019-06-14 MED ORDER — DEXTROSE-NACL 5-0.45 % IV SOLN: INTRAVENOUS | Status: DC

## 2019-06-14 MED ORDER — DOCUSATE SODIUM 100 MG PO CAPS
100.0000 mg | ORAL_CAPSULE | Freq: Once | ORAL | Status: AC
  Administered 2019-06-15: 100 mg via ORAL
  Filled 2019-06-14: qty 1

## 2019-06-14 MED ORDER — INSULIN REGULAR(HUMAN) IN NACL 100-0.9 UT/100ML-% IV SOLN
INTRAVENOUS | Status: DC
  Filled 2019-06-14: qty 100

## 2019-06-14 MED ORDER — IOHEXOL 300 MG/ML  SOLN
100.0000 mL | Freq: Once | INTRAMUSCULAR | Status: AC | PRN
  Administered 2019-06-14: 100 mL via INTRAVENOUS

## 2019-06-14 MED ORDER — LACTATED RINGERS IV BOLUS
2000.0000 mL | Freq: Once | INTRAVENOUS | Status: AC
  Administered 2019-06-14: 1000 mL via INTRAVENOUS

## 2019-06-14 MED ORDER — SODIUM CHLORIDE 0.9 % IV BOLUS: 1000.0000 mL | Freq: Once | INTRAVENOUS | Status: DC

## 2019-06-14 MED ORDER — SODIUM CHLORIDE 0.9 % IV SOLN: INTRAVENOUS | Status: DC

## 2019-06-14 NOTE — ED Notes (Signed)
Patient is being discharged from the [facility] and sent to the Emergency Department via wheelchair by staff. Per Dr Mannie Stabile, patient is stable but in need of higher level of care due to hyperglycemia. Patient is aware and verbalizes understanding of plan of care.  Vitals:   06/14/19 1714  BP: 122/86  Pulse: (!) 120  Resp: 20  Temp: 97.9 F (36.6 C)  SpO2: 97%

## 2019-06-14 NOTE — Progress Notes (Signed)
Virtual Visit via telephone Note  This visit type was conducted due to national recommendations for restrictions regarding the COVID-19 pandemic (e.g. social distancing).  This format is felt to be most appropriate for this patient at this time.  All issues noted in this document were discussed and addressed.  No physical exam was performed (except for noted visual exam findings with Video Visits).   I connected with Amanda Snyder today at  3:30 PM EST by a video enabled telemedicine application or telephone and verified that I am speaking with the correct person using two identifiers. Location patient: home Location provider: home office Persons participating in the virtual visit: patient, provider  I discussed the limitations, risks, security and privacy concerns of performing an evaluation and management service by telephone and the availability of in person appointments. I also discussed with the patient that there may be a patient responsible charge related to this service. The patient expressed understanding and agreed to proceed.  Interactive audio and video telecommunications were attempted between this provider and patient, however failed, due to patient having technical difficulties OR patient did not have access to video capability.  We continued and completed visit with audio only.  Reason for visit: same day visit  HPI: Abdominal pain/constipation/increased urinary frequency: Patient's mother reports she has not had a bowel movement in 2 days.  She is persistently complaining that her abdomen hurts.  The patient's mother notes that it is hard as a brick in her epigastric area.  She saw no blood in her stool previously.  She is eating less over the last several days and just wants to drink fluids.  She has had no fevers.  She does have drastically increased urinary frequency with incontinence.  No dysuria though the patient's mother notes that it does hurt to palpation near her  urethra.  The patient just started on her menstrual cycle so it is difficult to tell if there is any blood in her urine.  She was given a stool softener this morning though has not had a bowel movement.   ROS: See pertinent positives and negatives per HPI.  Past Medical History:  Diagnosis Date  . ADHD (attention deficit hyperactivity disorder) 2000   mom opted not to give medications (failed Adderall - 'zombielike', failed Strattera - 'no difference')  . Allergy    suspected allergy to pollen or dog(s)  . Anemia   . Diabetes mellitus without complication ([place])   . Hypertension   . Mental retardation 1997   Severe MR. did not walk until age 20-3 years, received Early Intervention.  . Obesity 12  . Premature birth with gestation of 35-36 weeks    [redacted] weeks GA  . Seizures ([place]) From age 42 month to 6 months   took Phenobarbital for a few years. weaned off after moving from [place] to [place]. Unknown etiology.  . Syncope 2006   witnessed by sister, no workup    Past Surgical History:  Procedure Laterality Date  . NO PAST SURGERIES      Family History  Problem Relation Age of Onset  . Stroke Mother 68  . Psoriasis Mother   . Alcohol abuse Mother   . Hyperlipidemia Mother   . Mental retardation Mother   . Hypertension Mother   . Alcohol abuse Brother   . Other Father        Died from gunshot wound.  . Cancer Maternal Grandmother 48       breast ca  .  Breast cancer Maternal Grandmother   . Alcohol abuse Maternal Grandfather   . Diabetes Paternal Grandmother     SOCIAL HX: Non-smoker   Current Outpatient Medications:  .  cetirizine (ZYRTEC) 10 MG tablet, Take 1 tablet (10 mg total) by mouth daily. (Patient taking differently: Take 10 mg by mouth as needed. ), Disp: 30 tablet, Rfl: 11 .  EPINEPHrine (EPIPEN 2-PAK) 0.3 mg/0.3 mL IJ SOAJ injection, Inject 0.3 mLs (0.3 mg total) into the muscle once as needed (anaphylaxis)., Disp: 1 Device, Rfl: 0 .  JANUVIA 100 MG tablet, TAKE 1  TABLET BY MOUTH EVERY DAY, Disp: 90 tablet, Rfl: 1 .  ketoconazole (NIZORAL) 2 % shampoo, Apply 1 application topically 2 (two) times a week. For 2-4 weeks, Disp: 120 mL, Rfl: 0 .  metFORMIN (GLUCOPHAGE) 850 MG tablet, Take 1 tablet (850 mg total) by mouth 2 (two) times daily with a meal., Disp: 180 tablet, Rfl: 1  EXAM: This was a telehealth telephone visit and thus no physical exam could be completed.  ASSESSMENT AND PLAN:  Discussed the following assessment and plan:  Abdominal pain Patient with recent onset abdominal discomfort to the degree that she has been complaining persistently about this.  She appears to have some constipation.  It is concerning to me that she has not been eating all that well and that she has an area that is firm in her epigastric region.  Discussed that she really needed an in person evaluation with a abdominal exam and given her complaints I suggested that she go to urgent care in [facility] for evaluation today.  The patient's mother noted that she would take her there.  The patient's mother did ask about an office evaluation tomorrow though I advised that there did not appear to be any openings at this time.  Incontinence Patient with drastically increased urinary frequency and incontinence.  Discussed that some of this could be related to constipation though could be related to UTI or other underlying intra-abdominal process.  Encouraged her to go to urgent care to be evaluated for this.   No orders of the defined types were placed in this encounter.   No orders of the defined types were placed in this encounter.    I discussed the assessment and treatment plan with the patient. The patient was provided an opportunity to ask questions and all were answered. The patient agreed with the plan and demonstrated an understanding of the instructions.   The patient was advised to call back or seek an in-person evaluation if the symptoms worsen or if the condition  fails to improve as anticipated.  I provided 11 minutes of non-face-to-face time during this encounter.   [name], MD

## 2019-06-14 NOTE — ED Provider Notes (Signed)
[facility] Provider Note   CSN: [id number] Arrival date & time: 06/14/19  1824     History Chief Complaint  Patient presents with  . Hyperglycemia  . Abdominal Pain  . Urinary Frequency    Amanda Snyder is a 24 y.o. female.  Patient with history of severe MR, seizure disorder, PCOS, "borderline" diabetes on Metformin and Januvia --presents with elevated blood sugars.  Mother reports 1 week history of frequent, copious urination.  Mother changes the patient when she is wet.  She would urinate every hour or 2.  She had an episode of vomiting this morning which was concerning.  Mother thought that the upper portion of the abdomen "felt hard".  They had a video visit with her doctor who recommended that he go to urgent care.  At urgent care her blood sugars were found to be in the 500s.  She was tachycardic as well.  Mother has been administering meds as prescribed.  There have been no symptoms of infection such as fever, URI symptoms, cough, chest pain.  Patient's appetite has been decreased which is unusual.  No lower extremity swelling or skin rashes.          Past Medical History:  Diagnosis Date  . ADHD (attention deficit hyperactivity disorder) 2000   mom opted not to give medications (failed Adderall - 'zombielike', failed Strattera - 'no difference')  . Allergy    suspected allergy to pollen or dog(s)  . Anemia   . Diabetes mellitus without complication ([place])   . Hypertension   . Mental retardation 1997   Severe MR. did not walk until age 45-3 years, received Early Intervention.  . Obesity 12  . Premature birth with gestation of 35-36 weeks    [redacted] weeks GA  . Seizures ([place]) From age 31 month to 6 months   took Phenobarbital for a few years. weaned off after moving from [place] to [place]. Unknown etiology.  . Syncope 2006   witnessed by sister, no workup    Patient Active Problem List   Diagnosis Date Noted  . Left upper extremity  swelling 05/13/2018  . Urine frequency 05/13/2018  . Sleep-disordered breathing 01/07/2018  . Thrombocytosis ([place]) 12/17/2017  . Nondisplaced fracture of proximal phalanx of right great toe, initial encounter for closed fracture 10/06/2017  . Abdominal pain 09/24/2017  . Seborrheic dermatitis of scalp 09/24/2017  . Right foot pain 09/24/2017  . Tachycardia 12/19/2016  . Irregular menses 12/19/2016  . Fatty liver 01/24/2016  . Lipoma 01/24/2016  . History of seizures 01/24/2016  . Menorrhagia with regular cycle 08/16/2015  . Abdominal pain, right upper quadrant 08/16/2015  . Dental anomaly 08/16/2015  . Iron deficiency anemia 08/03/2014  . Vitamin D deficiency 05/20/2014  . Exotropia of left eye 03/17/2014  . Anaphylaxis due to food 11/11/2013  . Morbid obesity ([place]) 11/11/2013  . PCOS (polycystic ovarian syndrome) 11/11/2013  . Elevated blood pressure reading without diagnosis of hypertension 08/25/2013  . Diabetes ([place]) 05/28/2013  . Incontinence 09/06/2011  . Kyphosis 08/08/2010  . Severe intellectual disabilities 12/18/2006    Past Surgical History:  Procedure Laterality Date  . NO PAST SURGERIES       OB History   No obstetric history on file.     Family History  Problem Relation Age of Onset  . Stroke Mother 5  . Psoriasis Mother   . Alcohol abuse Mother   . Hyperlipidemia Mother   . Mental retardation Mother   .  Hypertension Mother   . Alcohol abuse Brother   . Other Father        Died from gunshot wound.  . Cancer Maternal Grandmother 48       breast ca  . Breast cancer Maternal Grandmother   . Alcohol abuse Maternal Grandfather   . Diabetes Paternal Grandmother     Social History   Tobacco Use  . Smoking status: Never Smoker  . Smokeless tobacco: Never Used  Substance Use Topics  . Alcohol use: No    Alcohol/week: 0.0 standard drinks  . Drug use: No    Home Medications Prior to Admission medications   Medication Sig Start Date End Date  Taking? Authorizing Provider  cetirizine (ZYRTEC) 10 MG tablet Take 1 tablet (10 mg total) by mouth daily. Patient taking differently: Take 10 mg by mouth as needed.  11/08/15   Jackolyn Confer, MD  EPINEPHrine (EPIPEN 2-PAK) 0.3 mg/0.3 mL IJ SOAJ injection Inject 0.3 mLs (0.3 mg total) into the muscle once as needed (anaphylaxis). 09/24/17   Leone Haven, MD  ibuprofen (ADVIL) 400 MG tablet Take 400 mg by mouth every 6 (six) hours as needed.    [provider]  JANUVIA 100 MG tablet TAKE 1 TABLET BY MOUTH EVERY DAY 05/31/19   Leone Haven, MD  ketoconazole (NIZORAL) 2 % shampoo Apply 1 application topically 2 (two) times a week. For 2-4 weeks 09/25/17   Leone Haven, MD  metFORMIN (GLUCOPHAGE) 850 MG tablet Take 1 tablet (850 mg total) by mouth 2 (two) times daily with a meal. 01/19/19   Caryl Bis Angela Adam, MD  NON FORMULARY Stool softner    [provider]    Allergies    Clams [shellfish allergy] and Other  Review of Systems   Review of Systems  Constitutional: Positive for appetite change. Negative for fever.  HENT: Negative for rhinorrhea and sore throat.   Eyes: Negative for redness.  Respiratory: Negative for cough.   Cardiovascular: Negative for chest pain.  Gastrointestinal: Positive for abdominal pain, nausea and vomiting. Negative for diarrhea.  Endocrine: Positive for polydipsia and polyuria.  Genitourinary: Negative for dysuria.  Musculoskeletal: Negative for myalgias.  Skin: Negative for rash.  Neurological: Negative for headaches.    Physical Exam Updated Vital Signs BP (!) 161/107 (BP Location: Right Arm)   Pulse (!) 122   Temp 98.3 F (36.8 C) (Oral)   Resp (!) 22   LMP 06/14/2019   SpO2 99%   Physical Exam Vitals and nursing note reviewed.  Constitutional:      Appearance: She is well-developed.  HENT:     Head: Normocephalic and atraumatic.     Mouth/Throat:     Mouth: Mucous membranes are dry.  Eyes:     General:         Right eye: No discharge.        Left eye: No discharge.     Conjunctiva/sclera: Conjunctivae normal.  Cardiovascular:     Rate and Rhythm: Regular rhythm. Tachycardia present.     Heart sounds: Normal heart sounds.  Pulmonary:     Effort: Pulmonary effort is normal.     Breath sounds: Normal breath sounds.  Abdominal:     Palpations: Abdomen is soft.     Tenderness: There is no abdominal tenderness. There is no guarding or rebound.  Musculoskeletal:     Cervical back: Normal range of motion and neck supple.  Skin:    General: Skin is warm and  dry.  Neurological:     Mental Status: She is alert.     Comments: MR, minimally communicative speech at baseline.      ED Results / Procedures / Treatments   Labs (all labs ordered are listed, but only abnormal results are displayed) Labs Reviewed  CBG MONITORING, ED - Abnormal; Notable for the following components:      Result Value   Glucose-Capillary >600 (*)    All other components within normal limits  BASIC METABOLIC PANEL  CBC  URINALYSIS, ROUTINE W REFLEX MICROSCOPIC  BETA-HYDROXYBUTYRIC ACID  CBG MONITORING, ED  I-STAT BETA HCG BLOOD, ED ([facility], [facility], AP ONLY)  I-STAT VENOUS BLOOD GAS, ED    EKG None  Radiology No results found.  Procedures Procedures (including critical care time)  Medications Ordered in ED Medications  insulin regular, human (MYXREDLIN) 100 units/ 100 mL infusion (has no administration in time range)  0.9 %  sodium chloride infusion (has no administration in time range)  dextrose 5 %-0.45 % sodium chloride infusion (has no administration in time range)  dextrose 50 % solution 0-50 mL (has no administration in time range)    ED Course  I have reviewed the triage vital signs and the nursing notes.  Pertinent labs & imaging results that were available during my care of the patient were reviewed by me and considered in my medical decision making (see chart for details).  Patient seen and  examined. Work-up initiated.  Patient will be given fluids and started on an insulin drip.  No ketones in urine obtained at urgent care.  Will obtain beta hydroxybutyrate and VBG.  Vital signs reviewed and are as follows: BP (!) 161/107 (BP Location: Right Arm)   Pulse (!) 122   Temp 98.3 F (36.8 C) (Oral)   Resp (!) 22   LMP 06/14/2019   SpO2 99%   Patient discussed with and seen by Dr. Ralene Bathe. CT abd/pelvis ordered and pending.   Work-up to this point suggests hyperglycemia without ketosis. Patient continues to receive IV fluids and insulin drip.     MDM Rules/Calculators/A&P                      Continuing treatment for hyperglycemia and looking for inciting causes.    Final Clinical Impression(s) / ED Diagnoses Final diagnoses:  Hyperglycemia    Rx / DC Orders ED Discharge Orders    None       Suann Larry 06/14/19 2055    Quintella Reichert, MD 06/14/19 2359

## 2019-06-14 NOTE — ED Triage Notes (Signed)
Pt accompanied by mother who reports 1 week of decreased appetite, abd pain and urinary frequency. Pt is pre diabetic on metformin and genuvia but no insulin. Pt alert. CBG 600

## 2019-06-14 NOTE — ED Provider Notes (Signed)
[place]   [id number] 06/14/19 Arrival Time: 4098  ASSESSMENT & PLAN:  1. Diabetes mellitus due to underlying condition with hyperglycemia, without long-term current use of insulin ([place])   2. Generalized abdominal pain   3. Altered mental status, unspecified altered mental status type   4. Tachycardia      At risk for DKA. Likely dehydrated and will need insulin + IVF as well as lab work. No signs of active infections. Discussed with mother. To ED for further evaluation. Mother comfortable driving her. Stable upon discharge.  Labs Reviewed  CBG MONITORING, ED - Abnormal; Notable for the following components:   Glucose-Capillary 566 (*)    All other components within normal limits  POCT URINALYSIS DIP (DEVICE) - Abnormal; Notable for the following components:   Glucose, UA >=1000 (*)    Hgb urine dipstick LARGE (*)    Protein, ur 30 (*)    All other components within normal limits  POC URINE PREG, ED  POCT PREGNANCY, URINE   UPT negative.  Reviewed expectations re: course of current medical issues. Questions answered. Outlined signs and symptoms indicating need for more acute intervention. Patient verbalized understanding. After Visit Summary given.   SUBJECTIVE: History from: caregiver. Patient unable to communicate secondary to mental disability. Amanda Snyder is a 24 y.o. female whose mother reports one week of "wetting her diaper constantly and drinking water all day". Fairly abrupt onset. Occasional nausea with one emesis. Overall decreased PO intake. Mother also reports that "she just isn't acting right or like herself". No fevers. Amanda Snyder does report generalized abdominal discomfort over the past week. Unsure of last BM. No gross hematuria noted by mother. Also not sleeping well. Mother reports that she is taking her current medications as directed for "pre-diabetes". Amanda Snyder is ambulatory without assistance. No specific aggravating or alleviating factors  regarding current symptoms reported. No recent illnesses reported.  Patient's last menstrual period was 06/14/2019.   Past Surgical History:  Procedure Laterality Date   NO PAST SURGERIES       OBJECTIVE:  Vitals:   06/14/19 1714  BP: 122/86  Pulse: (!) 120  Resp: 20  Temp: 97.9 F (36.6 C)  TempSrc: Axillary  SpO2: 97%    General appearance: alert, oriented, no acute distress HEENT: ; AT; oropharynx is dry Lungs: unlabored respirations CV: tachycardic; regular Abdomen: obese; soft; without distention; no specific tenderness to palpation but does increase vocalization with abdominal exam; without guarding or rebound tenderness Back: no CVA TTP Extremities: without LE edema; symmetrical; without gross deformities Skin: warm and dry Neurologic: normal gait Psychological: alert and cooperative; normal mood and affect  Labs: Results for orders placed or performed during the hospital encounter of 06/14/19  Glucose, capillary  Result Value Ref Range   Glucose-Capillary 566 (HH) 70 - 99 mg/dL   Comment 1 Notify RN   POC urine pregnancy  Result Value Ref Range   Preg Test, Ur NEGATIVE NEGATIVE  POC CBG monitoring  Result Value Ref Range   Glucose-Capillary 566 (HH) 70 - 99 mg/dL   Comment 1 Notify RN   POCT urinalysis dip (device)  Result Value Ref Range   Glucose, UA >=1000 (A) NEGATIVE mg/dL   Bilirubin Urine NEGATIVE NEGATIVE   Ketones, ur NEGATIVE NEGATIVE mg/dL   Specific Gravity, Urine <=1.005 1.005 - 1.030   Hgb urine dipstick LARGE (A) NEGATIVE   pH 5.0 5.0 - 8.0   Protein, ur 30 (A) NEGATIVE mg/dL   Urobilinogen, UA 0.2 0.0 -  1.0 mg/dL   Nitrite NEGATIVE NEGATIVE   Leukocytes,Ua NEGATIVE NEGATIVE  Pregnancy, urine POC  Result Value Ref Range   Preg Test, Ur NEGATIVE NEGATIVE   Labs Reviewed  GLUCOSE, CAPILLARY - Abnormal; Notable for the following components:      Result Value   Glucose-Capillary 566 (*)    All other components within normal  limits  CBG MONITORING, ED - Abnormal; Notable for the following components:   Glucose-Capillary 566 (*)    All other components within normal limits  POCT URINALYSIS DIP (DEVICE) - Abnormal; Notable for the following components:   Glucose, UA >=1000 (*)    Hgb urine dipstick LARGE (*)    Protein, ur 30 (*)    All other components within normal limits  POC URINE PREG, ED  POCT PREGNANCY, URINE     Allergies  Allergen Reactions   Clams [Shellfish Allergy]     And shrimp   Other Other (See Comments)    Eyes and nose running                                               Past Medical History:  Diagnosis Date   ADHD (attention deficit hyperactivity disorder) 2000   mom opted not to give medications (failed Adderall - 'zombielike', failed Strattera - 'no difference')   Allergy    suspected allergy to pollen or dog(s)   Anemia    Diabetes mellitus without complication ([place])    Hypertension    Mental retardation 1997   Severe MR. did not walk until age 45-3 years, received Early Intervention.   Obesity 12   Premature birth with gestation of 35-36 weeks    [redacted] weeks GA   Seizures ([place]) From age 5 month to 6 months   took Phenobarbital for a few years. weaned off after moving from [place] to [place]. Unknown etiology.   Syncope 2006   witnessed by sister, no workup    Social History   Socioeconomic History   Marital status: Single    Spouse name: Not on file   Number of children: 0   Years of education: Not on file   Highest education level: Not on file  Occupational History   Occupation: Unemployed  Tobacco Use   Smoking status: Never Smoker   Smokeless tobacco: Never Used  Substance and Sexual Activity   Alcohol use: No    Alcohol/week: 0.0 standard drinks   Drug use: No   Sexual activity: Not on file  Other Topics Concern   Not on file  Social History Narrative   Lives at home with mom and youngest sister (2 years younger).      Attends [employer] school on [employer] (formerly [employer]) - will age out at 89 years. 5 students in classroom with one [employer] teacher and 2 aides. Has IEP. She will transition into a day program in June.      Mother maintains guardianship (beyond 57 years of age).      Right-handed.      1-2 caffeine drinks per week.      Social Determinants of Health   Financial Resource Strain:    Difficulty of Paying Living Expenses: Not on file  Food Insecurity:    Worried About [place] in the Last Year: Not on file   [employer]  in the Last Year: Not on file  Transportation Needs:    Lack of Transportation (Medical): Not on file   Lack of Transportation (Non-Medical): Not on file  Physical Activity:    Days of Exercise per Week: Not on file   Minutes of Exercise per Session: Not on file  Stress:    Feeling of Stress : Not on file  Social Connections:    Frequency of Communication with Friends and Family: Not on file   Frequency of Social Gatherings with Friends and Family: Not on file   Attends Religious Services: Not on file   Active Member of Clubs or Organizations: Not on file   Attends Archivist Meetings: Not on file   Marital Status: Not on file  Intimate Partner Violence:    Fear of Current or Ex-Partner: Not on file   Emotionally Abused: Not on file   Physically Abused: Not on file   Sexually Abused: Not on file    Family History  Problem Relation Age of Onset   Stroke Mother 53   Psoriasis Mother    Alcohol abuse Mother    Hyperlipidemia Mother    Mental retardation Mother    Hypertension Mother    Alcohol abuse Brother    Other Father        Died from gunshot wound.   Cancer Maternal Grandmother 48       breast ca   Breast cancer Maternal Grandmother    Alcohol abuse Maternal Grandfather    Diabetes Paternal Izetta Dakin, MD 06/15/19 1130

## 2019-06-14 NOTE — Assessment & Plan Note (Signed)
Patient with drastically increased urinary frequency and incontinence.  Discussed that some of this could be related to constipation though could be related to UTI or other underlying intra-abdominal process.  Encouraged her to go to urgent care to be evaluated for this.

## 2019-06-14 NOTE — ED Notes (Signed)
Dr Mannie Stabile notified of cbg [phone]

## 2019-06-14 NOTE — ED Triage Notes (Signed)
pcp e visit at 3:30.  Instructed to come for evaluation.  C/o abdominal pain, started menstrual cycle today, incontinent of urine.  Patient is not eating as usual.  Last BM at least 2 days or more.

## 2019-06-14 NOTE — Assessment & Plan Note (Signed)
Patient with recent onset abdominal discomfort to the degree that she has been complaining persistently about this.  She appears to have some constipation.  It is concerning to me that she has not been eating all that well and that she has an area that is firm in her epigastric region.  Discussed that she really needed an in person evaluation with a abdominal exam and given her complaints I suggested that she go to urgent care in [facility] for evaluation today.  The patient's mother noted that she would take her there.  The patient's mother did ask about an office evaluation tomorrow though I advised that there did not appear to be any openings at this time.

## 2019-06-15 ENCOUNTER — Encounter (HOSPITAL_COMMUNITY): Payer: Self-pay | Admitting: Internal Medicine

## 2019-06-15 ENCOUNTER — Observation Stay (HOSPITAL_COMMUNITY): Payer: Medicare Other

## 2019-06-15 DIAGNOSIS — K5900 Constipation, unspecified: Secondary | ICD-10-CM | POA: Diagnosis present

## 2019-06-15 DIAGNOSIS — R0602 Shortness of breath: Secondary | ICD-10-CM | POA: Diagnosis not present

## 2019-06-15 DIAGNOSIS — R739 Hyperglycemia, unspecified: Secondary | ICD-10-CM | POA: Diagnosis not present

## 2019-06-15 DIAGNOSIS — Z7984 Long term (current) use of oral hypoglycemic drugs: Secondary | ICD-10-CM | POA: Diagnosis not present

## 2019-06-15 DIAGNOSIS — E872 Acidosis: Secondary | ICD-10-CM | POA: Diagnosis not present

## 2019-06-15 DIAGNOSIS — Z810 Family history of intellectual disabilities: Secondary | ICD-10-CM | POA: Diagnosis not present

## 2019-06-15 DIAGNOSIS — Z803 Family history of malignant neoplasm of breast: Secondary | ICD-10-CM | POA: Diagnosis not present

## 2019-06-15 DIAGNOSIS — R945 Abnormal results of liver function studies: Secondary | ICD-10-CM | POA: Diagnosis not present

## 2019-06-15 DIAGNOSIS — Z8249 Family history of ischemic heart disease and other diseases of the circulatory system: Secondary | ICD-10-CM | POA: Diagnosis not present

## 2019-06-15 DIAGNOSIS — D509 Iron deficiency anemia, unspecified: Secondary | ICD-10-CM | POA: Diagnosis not present

## 2019-06-15 DIAGNOSIS — E1165 Type 2 diabetes mellitus with hyperglycemia: Secondary | ICD-10-CM | POA: Diagnosis not present

## 2019-06-15 DIAGNOSIS — E871 Hypo-osmolality and hyponatremia: Secondary | ICD-10-CM | POA: Diagnosis present

## 2019-06-15 DIAGNOSIS — R9431 Abnormal electrocardiogram [ECG] [EKG]: Secondary | ICD-10-CM | POA: Diagnosis present

## 2019-06-15 DIAGNOSIS — E860 Dehydration: Secondary | ICD-10-CM | POA: Diagnosis present

## 2019-06-15 DIAGNOSIS — Z823 Family history of stroke: Secondary | ICD-10-CM | POA: Diagnosis not present

## 2019-06-15 DIAGNOSIS — K760 Fatty (change of) liver, not elsewhere classified: Secondary | ICD-10-CM | POA: Diagnosis present

## 2019-06-15 DIAGNOSIS — Z8349 Family history of other endocrine, nutritional and metabolic diseases: Secondary | ICD-10-CM | POA: Diagnosis not present

## 2019-06-15 DIAGNOSIS — E876 Hypokalemia: Secondary | ICD-10-CM | POA: Diagnosis not present

## 2019-06-15 DIAGNOSIS — Z833 Family history of diabetes mellitus: Secondary | ICD-10-CM | POA: Diagnosis not present

## 2019-06-15 DIAGNOSIS — E1110 Type 2 diabetes mellitus with ketoacidosis without coma: Secondary | ICD-10-CM | POA: Diagnosis present

## 2019-06-15 DIAGNOSIS — F72 Severe intellectual disabilities: Secondary | ICD-10-CM | POA: Diagnosis not present

## 2019-06-15 DIAGNOSIS — R404 Transient alteration of awareness: Secondary | ICD-10-CM | POA: Diagnosis not present

## 2019-06-15 DIAGNOSIS — D72829 Elevated white blood cell count, unspecified: Secondary | ICD-10-CM | POA: Diagnosis not present

## 2019-06-15 DIAGNOSIS — R000 Tachycardia, unspecified: Secondary | ICD-10-CM | POA: Diagnosis not present

## 2019-06-15 DIAGNOSIS — Z6841 Body Mass Index (BMI) 40.0 and over, adult: Secondary | ICD-10-CM | POA: Diagnosis not present

## 2019-06-15 DIAGNOSIS — G40909 Epilepsy, unspecified, not intractable, without status epilepticus: Secondary | ICD-10-CM | POA: Diagnosis present

## 2019-06-15 DIAGNOSIS — R0600 Dyspnea, unspecified: Secondary | ICD-10-CM | POA: Diagnosis present

## 2019-06-15 DIAGNOSIS — Z20822 Contact with and (suspected) exposure to covid-19: Secondary | ICD-10-CM | POA: Diagnosis present

## 2019-06-15 LAB — GLUCOSE, CAPILLARY
Glucose-Capillary: 159 mg/dL — ABNORMAL HIGH (ref 70–99)
Glucose-Capillary: 306 mg/dL — ABNORMAL HIGH (ref 70–99)
Glucose-Capillary: 295 mg/dL — ABNORMAL HIGH (ref 70–99)
Glucose-Capillary: 280 mg/dL — ABNORMAL HIGH (ref 70–99)
Glucose-Capillary: 297 mg/dL — ABNORMAL HIGH (ref 70–99)

## 2019-06-15 LAB — D-DIMER, QUANTITATIVE: D-Dimer, Quant: 0.65 ug/mL-FEU — ABNORMAL HIGH (ref 0.00–0.50)

## 2019-06-15 LAB — BASIC METABOLIC PANEL
Anion gap: 13 (ref 5–15)
Anion gap: 13 (ref 5–15)
Anion gap: 13 (ref 5–15)
BUN: 6 mg/dL (ref 6–20)
BUN: 5 mg/dL — ABNORMAL LOW (ref 6–20)
BUN: 5 mg/dL — ABNORMAL LOW (ref 6–20)
CO2: 25 mmol/L (ref 22–32)
CO2: 23 mmol/L (ref 22–32)
CO2: 22 mmol/L (ref 22–32)
Calcium: 9 mg/dL (ref 8.9–10.3)
Calcium: 8.3 mg/dL — ABNORMAL LOW (ref 8.9–10.3)
Calcium: 8.1 mg/dL — ABNORMAL LOW (ref 8.9–10.3)
Chloride: 98 mmol/L (ref 98–111)
Chloride: 100 mmol/L (ref 98–111)
Chloride: 100 mmol/L (ref 98–111)
Creatinine, Ser: 0.78 mg/dL (ref 0.44–1.00)
Creatinine, Ser: 0.64 mg/dL (ref 0.44–1.00)
Creatinine, Ser: 0.85 mg/dL (ref 0.44–1.00)
GFR calc Af Amer: >60 mL/min (ref >60)
GFR calc Af Amer: >60 mL/min (ref >60)
GFR calc Af Amer: >60 mL/min (ref >60)
GFR calc non Af Amer: >60 mL/min (ref >60)
GFR calc non Af Amer: >60 mL/min (ref >60)
GFR calc non Af Amer: >60 mL/min (ref >60)
Glucose, Bld: 261 mg/dL — ABNORMAL HIGH (ref 70–99)
Glucose, Bld: 204 mg/dL — ABNORMAL HIGH (ref 70–99)
Glucose, Bld: 316 mg/dL — ABNORMAL HIGH (ref 70–99)
Potassium: 2.8 mmol/L — ABNORMAL LOW (ref 3.5–5.1)
Potassium: 3.4 mmol/L — ABNORMAL LOW (ref 3.5–5.1)
Potassium: 3.8 mmol/L (ref 3.5–5.1)
Sodium: 136 mmol/L (ref 135–145)
Sodium: 136 mmol/L (ref 135–145)
Sodium: 135 mmol/L (ref 135–145)

## 2019-06-15 LAB — CBG MONITORING, ED
Glucose-Capillary: 300 mg/dL — ABNORMAL HIGH (ref 70–99)
Glucose-Capillary: 223 mg/dL — ABNORMAL HIGH (ref 70–99)
Glucose-Capillary: 208 mg/dL — ABNORMAL HIGH (ref 70–99)
Glucose-Capillary: 245 mg/dL — ABNORMAL HIGH (ref 70–99)
Glucose-Capillary: 193 mg/dL — ABNORMAL HIGH (ref 70–99)
Glucose-Capillary: 201 mg/dL — ABNORMAL HIGH (ref 70–99)
Glucose-Capillary: 173 mg/dL — ABNORMAL HIGH (ref 70–99)
Glucose-Capillary: 159 mg/dL — ABNORMAL HIGH (ref 70–99)
Glucose-Capillary: 148 mg/dL — ABNORMAL HIGH (ref 70–99)

## 2019-06-15 LAB — HIV ANTIBODY (ROUTINE TESTING W REFLEX): HIV Screen 4th Generation wRfx: NONREACTIVE

## 2019-06-15 LAB — HEMOGLOBIN A1C
Hgb A1c MFr Bld: 9.9 % — ABNORMAL HIGH (ref 4.8–5.6)
Mean Plasma Glucose: 237.43 mg/dL

## 2019-06-15 LAB — MAGNESIUM: Magnesium: 1.6 mg/dL — ABNORMAL LOW (ref 1.7–2.4)

## 2019-06-15 LAB — SARS CORONAVIRUS 2 (TAT 6-24 HRS): SARS Coronavirus 2: NEGATIVE

## 2019-06-15 MED ORDER — SENNOSIDES-DOCUSATE SODIUM 8.6-50 MG PO TABS: 1.0000 | ORAL_TABLET | Freq: Every evening | ORAL | Status: DC | PRN

## 2019-06-15 MED ORDER — POLYETHYLENE GLYCOL 3350 17 G PO PACK
17.0000 g | PACK | Freq: Two times a day (BID) | ORAL | Status: DC
  Administered 2019-06-15 – 2019-06-19 (×6): 17 g via ORAL
  Filled 2019-06-15 (×8): qty 1

## 2019-06-15 MED ORDER — INSULIN ASPART 100 UNIT/ML ~~LOC~~ SOLN
0.0000 [IU] | Freq: Three times a day (TID) | SUBCUTANEOUS | Status: DC
  Administered 2019-06-15: 3 [IU] via SUBCUTANEOUS
  Administered 2019-06-15: 8 [IU] via SUBCUTANEOUS
  Administered 2019-06-16: 11 [IU] via SUBCUTANEOUS
  Administered 2019-06-16 (×2): 8 [IU] via SUBCUTANEOUS
  Administered 2019-06-17: 15 [IU] via SUBCUTANEOUS

## 2019-06-15 MED ORDER — INSULIN GLARGINE 100 UNIT/ML ~~LOC~~ SOLN
10.0000 [IU] | SUBCUTANEOUS | Status: DC
  Administered 2019-06-15: 10 [IU] via SUBCUTANEOUS
  Filled 2019-06-15 (×2): qty 0.1

## 2019-06-15 MED ORDER — SODIUM CHLORIDE 0.9 % IV SOLN: INTRAVENOUS | Status: DC

## 2019-06-15 MED ORDER — ONDANSETRON HCL 4 MG/2ML IJ SOLN: 4.0000 mg | Freq: Four times a day (QID) | INTRAMUSCULAR | Status: DC | PRN

## 2019-06-15 MED ORDER — SENNOSIDES-DOCUSATE SODIUM 8.6-50 MG PO TABS
1.0000 | ORAL_TABLET | Freq: Two times a day (BID) | ORAL | Status: DC
  Administered 2019-06-15 – 2019-06-19 (×7): 1 via ORAL
  Filled 2019-06-15 (×8): qty 1

## 2019-06-15 MED ORDER — ACETAMINOPHEN 325 MG PO TABS
650.0000 mg | ORAL_TABLET | Freq: Four times a day (QID) | ORAL | Status: DC | PRN
  Administered 2019-06-16: 650 mg via ORAL
  Filled 2019-06-15: qty 2

## 2019-06-15 MED ORDER — ACETAMINOPHEN 650 MG RE SUPP: 650.0000 mg | Freq: Four times a day (QID) | RECTAL | Status: DC | PRN

## 2019-06-15 MED ORDER — INSULIN ASPART 100 UNIT/ML ~~LOC~~ SOLN
0.0000 [IU] | Freq: Every day | SUBCUTANEOUS | Status: DC
  Administered 2019-06-15 – 2019-06-16 (×2): 3 [IU] via SUBCUTANEOUS

## 2019-06-15 MED ORDER — POTASSIUM CHLORIDE 10 MEQ/100ML IV SOLN
10.0000 meq | INTRAVENOUS | Status: AC
  Administered 2019-06-15 (×6): 10 meq via INTRAVENOUS
  Filled 2019-06-15 (×6): qty 100

## 2019-06-15 MED ORDER — ONDANSETRON HCL 4 MG PO TABS: 4.0000 mg | ORAL_TABLET | Freq: Four times a day (QID) | ORAL | Status: DC | PRN

## 2019-06-15 MED ORDER — INSULIN ASPART 100 UNIT/ML ~~LOC~~ SOLN
10.0000 [IU] | Freq: Once | SUBCUTANEOUS | Status: AC
  Administered 2019-06-15: 10 [IU] via SUBCUTANEOUS

## 2019-06-15 MED ORDER — ENOXAPARIN SODIUM 40 MG/0.4ML ~~LOC~~ SOLN
40.0000 mg | SUBCUTANEOUS | Status: DC
  Administered 2019-06-16 – 2019-06-19 (×4): 40 mg via SUBCUTANEOUS
  Filled 2019-06-15 (×4): qty 0.4

## 2019-06-15 MED ORDER — BISACODYL 5 MG PO TBEC: 5.0000 mg | DELAYED_RELEASE_TABLET | Freq: Every day | ORAL | Status: DC | PRN

## 2019-06-15 MED ORDER — SODIUM CHLORIDE 0.9% FLUSH
3.0000 mL | Freq: Two times a day (BID) | INTRAVENOUS | Status: DC
  Administered 2019-06-15 – 2019-06-19 (×7): 3 mL via INTRAVENOUS

## 2019-06-15 MED ORDER — MAGNESIUM SULFATE 2 GM/50ML IV SOLN
2.0000 g | Freq: Once | INTRAVENOUS | Status: AC
  Administered 2019-06-15: 2 g via INTRAVENOUS
  Filled 2019-06-15: qty 50

## 2019-06-15 NOTE — ED Notes (Signed)
Contacted Diabetes Coordinator for diabetes education for pt and mother

## 2019-06-15 NOTE — ED Notes (Signed)
Rn spoke to Dr. Baltazar Najjar in regards to pt potassium level. Discontinued D5 fluids per MD. Insulin paused due to low potassium and lack of additional IV access. IV team consulted.

## 2019-06-15 NOTE — ED Notes (Signed)
[facility] TO RN ELIZABETH--Shawna Wearing

## 2019-06-15 NOTE — Progress Notes (Signed)
   Follow Up Note  HPI: Please see full H&P done earlier this am Briefly 24 year old female with severe intellectual disability, diabetes mellitus type 2, presents to the ED due to hypoglycemia, nausea/vomiting, abdominal pain.  Patient unable to give any history.  In the ED, patient noted to be hyperglycemic.  Patient admitted for further management.   Today, saw patient in the ED, noted to be tachycardic, with some tachypnea (pt noted to be moving non-stop in bed), noted some false readings of her vital signs due to these movements.  Mother at bedside, states that she has not complained about abdominal pain, nausea or vomiting.  No bowel movements in the past couple of days.  Exam:  General: NAD, morbidly obese  Cardiovascular: S1, S2 present  Respiratory:  Diminished breath sounds bilaterally  Abdomen: Soft, nontender, nondistended, bowel sounds present  Musculoskeletal: No bilateral pedal edema noted  Skin: Normal  Psychiatry:  Unable to assess    Present on Admission: . Severe hyperglycemia due to diabetes mellitus ([place]) . Severe intellectual disabilities   ?DKA with a history of type 2 diabetes mellitus A1c 9.9 Currently resolved Glucose 550, mildly acidotic, with increased anion gap  UA with no signs of infection except for glucose Chest x-ray unremarkable Status post glucose stabilizer Switched to Lantus, SSI, Accu-Cheks, hypoglycemic protocol Hold home Januvia, Metformin Diabetes coordinator consulted  Tachycardia Unknown etiology, could be related to dehydration vs ?anxiety/?pain/frequent movement Saturating well on room air EKG with sinus tachy, with prolonged QTC Chest x-ray unremarkable D-dimer mildly elevated at 0.65 Hydrate for another 24 hours, if persistent, may consider CT chest to rule out PE or an ECHO  Leukocytosis Likely reactive Daily CBC

## 2019-06-15 NOTE — H&P (Signed)
History and Physical    Amanda Snyder UXL:[id number] DOB: Dec 28, 1995 DOA: 06/14/2019  PCP: Leone Haven, MD  Patient coming from: Urgent care  I have personally briefly reviewed patient's old medical records in [facility] [place]  Chief Complaint: Hyperglycemia  HPI: Amanda Snyder is a 24 y.o. female with medical history significant for severe intellectual disability and type 2 diabetes who presents to the ED from urgent care for evaluation of hyperglycemia and nausea, vomiting, abdominal pain.  Patient unable to provide history therefore entire history is obtained from mother at bedside, EDP, and chart review.  Mother reports patient has been having about 1 week of frequent urination.  She has recently been complaining of abdominal pain.  She has had nausea with one episode of scant emesis.  She had a telemedicine visit with her PCP who recommended she present to the urgent care for an in person abdominal examination.  In urgent care she was noted to be hyperglycemic with blood sugar >600.  Urine dip showed negative nitrates, negative leukocytes, > 1000 glucose, negative ketones.  Patient was sent to the ED for further evaluation management.  ED Course:  Initial vitals showed BP 150/98, pulse 111, RR 20, SPO2 100% on room air.  Labs show serum glucose 550, sodium 132, potassium 4.1, bicarb 21, anion gap 16, BUN 9, creatinine 0.86, AST 53, ALT 81, alk phos 83, total bilirubin 0.7, lipase 22, beta hydroxy butyrate 0.7.  VBG shows pH 7.413, PCO2 37.1, PO2 64.  Urinalysis is collected and pending.  SARS-CoV-2 PCR is ordered and pending.  Patient was given 2 L LR, 1 L normal saline, and started on insulin infusion.  The hospitalist service was consulted to admit for further evaluation and management.  Review of Systems:  Unable to obtain full review of systems due to patient's mental status.   Past Medical History:  Diagnosis Date  . ADHD (attention deficit hyperactivity disorder)  2000   mom opted not to give medications (failed Adderall - 'zombielike', failed Strattera - 'no difference')  . Allergy    suspected allergy to pollen or dog(s)  . Anemia   . Diabetes mellitus without complication ([place])   . Hypertension   . Mental retardation 1997   Severe MR. did not walk until age 20-3 years, received Early Intervention.  . Obesity 12  . Premature birth with gestation of 35-36 weeks    [redacted] weeks GA  . Seizures ([place]) From age 15 month to 6 months   took Phenobarbital for a few years. weaned off after moving from [place] to [place]. Unknown etiology.  . Syncope 2006   witnessed by sister, no workup    Past Surgical History:  Procedure Laterality Date  . NO PAST SURGERIES      Social History:  reports that she has never smoked. She has never used smokeless tobacco. She reports that she does not drink alcohol or use drugs.  Allergies  Allergen Reactions  . Clams [Shellfish Allergy]     And shrimp  . Other Other (See Comments)    Eyes and nose running    Family History  Problem Relation Age of Onset  . Stroke Mother 8  . Psoriasis Mother   . Alcohol abuse Mother   . Hyperlipidemia Mother   . Mental retardation Mother   . Hypertension Mother   . Alcohol abuse Brother   . Other Father        Died from gunshot wound.  . Cancer Maternal Grandmother 77  breast ca  . Breast cancer Maternal Grandmother   . Alcohol abuse Maternal Grandfather   . Diabetes Paternal Grandmother      Prior to Admission medications   Medication Sig Start Date End Date Taking? Authorizing Provider  cetirizine (ZYRTEC) 10 MG tablet Take 1 tablet (10 mg total) by mouth daily. Patient taking differently: Take 10 mg by mouth as needed for allergies.  11/08/15  Yes Jackolyn Confer, MD  EPINEPHrine (EPIPEN 2-PAK) 0.3 mg/0.3 mL IJ SOAJ injection Inject 0.3 mLs (0.3 mg total) into the muscle once as needed (anaphylaxis). 09/24/17  Yes Leone Haven, MD  ibuprofen (ADVIL) 400 MG tablet  Take 400 mg by mouth as needed for mild pain or moderate pain.    Yes [provider]  JANUVIA 100 MG tablet TAKE 1 TABLET BY MOUTH EVERY DAY Patient taking differently: Take 100 mg by mouth daily.  05/31/19  Yes Leone Haven, MD  metFORMIN (GLUCOPHAGE) 850 MG tablet Take 1 tablet (850 mg total) by mouth 2 (two) times daily with a meal. 01/19/19  Yes Leone Haven, MD  NON FORMULARY Take 1 tablet by mouth as needed (constipation). Stool softner    Yes [provider]  ketoconazole (NIZORAL) 2 % shampoo Apply 1 application topically 2 (two) times a week. For 2-4 weeks Patient not taking: Reported on 06/14/2019 09/25/17   Leone Haven, MD    Physical Exam: Vitals:   06/14/19 2100 06/14/19 2115 06/14/19 2134 06/14/19 2215  BP: (!) 158/98 (!) 140/93 (!) 152/102 (!) 146/84  Pulse: (!) 112     Resp:      Temp:      TempSrc:      SpO2: 100%      Constitutional: Obese woman resting supine in bed, NAD, calm, comfortable, smiling Eyes: PERRL, lids and conjunctivae normal ENMT: Mucous membranes are moist. Posterior pharynx clear of any exudate or lesions.Normal dentition.  Neck: normal, supple, no masses. Respiratory: clear to auscultation anteriorly. Normal respiratory effort. No accessory muscle use.  Cardiovascular: Slightly tachycardic, no murmurs / rubs / gallops. No extremity edema. 2+ pedal pulses. Abdomen: no tenderness, no masses palpated. No hepatosplenomegaly. Bowel sounds positive.  Musculoskeletal: no clubbing / cyanosis. Normal muscle tone.  Skin: no rashes, lesions, ulcers. No induration Neurologic: CN 2-12 grossly intact. Sensation intact, moving all extremities equally. Psychiatric: Awake and alert.  Not speaking.   Labs on Admission: I have personally reviewed following labs and imaging studies  CBC: Recent Labs  Lab 06/14/19 1922 06/14/19 1941  WBC 11.0*  --   HGB 13.7 16.3*  HCT 45.0 48.0*  MCV 72.6*  --   PLT 408*  --    Basic  Metabolic Panel: Recent Labs  Lab 06/14/19 1930 06/14/19 1941  NA 132* 133*  K 4.1 3.9  CL 95*  --   CO2 21*  --   GLUCOSE 550*  --   BUN 9  --   CREATININE 0.86  --   CALCIUM 9.1  --    GFR: Estimated Creatinine Clearance: 124.2 mL/min (by C-G formula based on SCr of 0.86 mg/dL). Liver Function Tests: Recent Labs  Lab 06/14/19 1930  AST 53*  ALT 81*  ALKPHOS 83  BILITOT 0.7  PROT 8.0  ALBUMIN 4.0   Recent Labs  Lab 06/14/19 1930  LIPASE 22   No results for input(s): AMMONIA in the last 168 hours. Coagulation Profile: No results for input(s): INR, PROTIME in the last 168 hours. Cardiac  Enzymes: No results for input(s): CKTOTAL, CKMB, CKMBINDEX, TROPONINI in the last 168 hours. BNP (last 3 results) No results for input(s): PROBNP in the last 8760 hours. HbA1C: No results for input(s): HGBA1C in the last 72 hours. CBG: Recent Labs  Lab 06/14/19 1804 06/14/19 1833 06/14/19 2036 06/14/19 2335  GLUCAP 566*  566* >600* 447* 336*   Lipid Profile: No results for input(s): CHOL, HDL, LDLCALC, TRIG, CHOLHDL, LDLDIRECT in the last 72 hours. Thyroid Function Tests: No results for input(s): TSH, T4TOTAL, FREET4, T3FREE, THYROIDAB in the last 72 hours. Anemia Panel: No results for input(s): VITAMINB12, FOLATE, FERRITIN, TIBC, IRON, RETICCTPCT in the last 72 hours. Urine analysis:    Component Value Date/Time   LABSPEC <=1.005 06/14/2019 1734   PHURINE 5.0 06/14/2019 1734   GLUCOSEU >=1000 (A) 06/14/2019 1734   HGBUR LARGE (A) 06/14/2019 1734   BILIRUBINUR NEGATIVE 06/14/2019 1734   KETONESUR NEGATIVE 06/14/2019 1734   PROTEINUR 30 (A) 06/14/2019 1734   UROBILINOGEN 0.2 06/14/2019 1734   NITRITE NEGATIVE 06/14/2019 1734   LEUKOCYTESUR NEGATIVE 06/14/2019 1734    Radiological Exams on Admission: CT Abdomen Pelvis W Contrast  Result Date: 06/14/2019 CLINICAL DATA:  Vomiting the past 2 days. Elevated blood sugars. EXAM: CT ABDOMEN AND PELVIS WITH CONTRAST  TECHNIQUE: Multidetector CT imaging of the abdomen and pelvis was performed using the standard protocol following bolus administration of intravenous contrast. Automatic exposure control utilized. CONTRAST:  129m OMNIPAQUE IOHEXOL 300 MG/ML  SOLN COMPARISON:  None. FINDINGS: Lower chest: Normal heart size. No pneumonia or dependent pleural fluid. Small hiatal hernia. Respiratory motion markedly degrading evaluation of the partially imaged superior mediastinal soft tissues. Hepatobiliary: Marked fatty infiltration of the liver with probable foci of focal fatty sparing measuring 16 mm and 11 mm at the gallbladder fossa. Normal gallbladder and biliary tree. Mild hepatomegaly. Patent portal and hepatic veins. Normal smooth hepatic contours without cirrhosis or ascites. Pancreas: No acute pancreatitis or apparent abnormality. Spleen: Normal. Adrenals/Urinary Tract: Normal. Stomach/Bowel: No apparent gastric wall thickening or perigastric inflammatory change. Decompressed small bowel without obstruction. Normal appendix, series 3, image 65. Mild diverticulosis coli and moderate colonic stool burden. No apparent bowel wall thickening. 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 Left dual renal veins, retroaortic and pre aortic. No abdominal or pelvic adenopathy. Small nonspecific subcentimeter short axis diameter lymph node at the diaphragmatic crus. No abdominal abscess, free intraperitoneal fluid or air. Reproductive: Normally sized uterus with normal uterine contours. Symmetrical size of both ovaries and a bilateral normal ovarian follicle. Other: Small periumbilical herniation of fat without strangulation or incarceration. Musculoskeletal: Mild skeletal degenerative change. Probable benign bone island in the left acetabulum IMPRESSION: 1. No acute CT finding to account for the patient's symptoms. 2. Marked fatty infiltration of the liver with mild  hepatomegaly and two foci of probable focal fatty sparing at the gallbladder fossa. 3. Small hiatal hernia. 4. Mild diverticulosis coli and moderate stool burden without evidence of acute diverticulitis or bowel obstruction. Electronically Signed   By: RRevonda Humphrey  On: 06/14/2019 23:18    EKG: Independently reviewed. Sinus tachycardia, rate 111, QTc 524.  No prior for comparison.  Assessment/Plan Principal Problem:   Severe hyperglycemia due to diabetes mellitus ([place] Active Problems:   Severe intellectual disabilities  Peyson JPavlovichis a 24y.o. female with medical history significant for severe intellectual disability and type 2 diabetes who is admitted with severe hyperglycemia in setting of type  2 diabetes.  Hyperglycemia in setting of type 2 diabetes: Home diabetes regimen includes Metformin and Januvia, not on insulin therapy.  Initial CBG >600, bicarb 21, anion gap 16, VBG pH 7.413, beta hydroxybutyrate 0.7.  Outside urine dipstick was negative for ketones or evidence of UTI. -Continue insulin infusion per hypoglycemia protocol -Continue IV NS @ 125/hr and transition to D5-1/2 NS when CBG <250 -Keep n.p.o. except for sips with meds for now -Repeat BMP in AM -Holding Metformin and Januvia -Check hemoglobin A1c  Nausea/abdominal pain: Likely related to hyperglycemia and constipation.  Fatty liver noted on CT which is chronic per patient's mother. -Correct hyperglycemia with mild -Antiemetics as needed -Start bowel regimen  Severe intellectual disability: Chronic and stable.  Patient ambulates on her own per mother.  DVT prophylaxis: Lovenox Code Status: Full code, confirmed with mother Family Communication: Discussed with patient's mother at bedside Disposition Plan: From home, likely discharge to home pending correction of hyperglycemia and symptomatic improvement. Consults called: None Admission status: Observation   Zada Finders MD [facility]  If  7PM-7AM, please contact night-coverage [email or web address]  06/15/2019, 1:02 AM

## 2019-06-15 NOTE — ED Notes (Signed)
Attempted to call report x 1  

## 2019-06-15 NOTE — ED Notes (Signed)
Paged admitting in regards to pt potassium level.

## 2019-06-16 ENCOUNTER — Inpatient Hospital Stay (HOSPITAL_COMMUNITY): Payer: Medicare Other

## 2019-06-16 DIAGNOSIS — E872 Acidosis: Secondary | ICD-10-CM

## 2019-06-16 DIAGNOSIS — D72829 Elevated white blood cell count, unspecified: Secondary | ICD-10-CM

## 2019-06-16 DIAGNOSIS — R739 Hyperglycemia, unspecified: Secondary | ICD-10-CM

## 2019-06-16 DIAGNOSIS — D509 Iron deficiency anemia, unspecified: Secondary | ICD-10-CM

## 2019-06-16 LAB — CBC WITH DIFFERENTIAL/PLATELET
Abs Immature Granulocytes: 0.02 10*3/uL (ref 0.00–0.07)
Basophils Absolute: 0 10*3/uL (ref 0.0–0.1)
Basophils Relative: 0 %
Eosinophils Absolute: 0.3 10*3/uL (ref 0.0–0.5)
Eosinophils Relative: 5 %
HCT: 35.4 % — ABNORMAL LOW (ref 36.0–46.0)
Hemoglobin: 11.1 g/dL — ABNORMAL LOW (ref 12.0–15.0)
Immature Granulocytes: 0 %
Lymphocytes Relative: 45 %
Lymphs Abs: 3.4 10*3/uL (ref 0.7–4.0)
MCH: 22.2 pg — ABNORMAL LOW (ref 26.0–34.0)
MCHC: 31.4 g/dL (ref 30.0–36.0)
MCV: 70.7 fL — ABNORMAL LOW (ref 80.0–100.0)
Monocytes Absolute: 0.7 10*3/uL (ref 0.1–1.0)
Monocytes Relative: 9 %
Neutro Abs: 3 10*3/uL (ref 1.7–7.7)
Neutrophils Relative %: 41 %
Platelets: 322 10*3/uL (ref 150–400)
RBC: 5.01 MIL/uL (ref 3.87–5.11)
RDW: 14.1 % (ref 11.5–15.5)
WBC: 7.4 10*3/uL (ref 4.0–10.5)
nRBC: 0 % (ref 0.0–0.2)

## 2019-06-16 LAB — BASIC METABOLIC PANEL
Anion gap: 14 (ref 5–15)
BUN: 6 mg/dL (ref 6–20)
CO2: 21 mmol/L — ABNORMAL LOW (ref 22–32)
Calcium: 8 mg/dL — ABNORMAL LOW (ref 8.9–10.3)
Chloride: 102 mmol/L (ref 98–111)
Creatinine, Ser: 0.63 mg/dL (ref 0.44–1.00)
GFR calc Af Amer: >60 mL/min (ref >60)
GFR calc non Af Amer: >60 mL/min (ref >60)
Glucose, Bld: 324 mg/dL — ABNORMAL HIGH (ref 70–99)
Potassium: 3.7 mmol/L (ref 3.5–5.1)
Sodium: 137 mmol/L (ref 135–145)

## 2019-06-16 LAB — CBC
HCT: 42.3 % (ref 36.0–46.0)
Hemoglobin: 12.7 g/dL (ref 12.0–15.0)
MCH: 21.8 pg — ABNORMAL LOW (ref 26.0–34.0)
MCHC: 30 g/dL (ref 30.0–36.0)
MCV: 72.7 fL — ABNORMAL LOW (ref 80.0–100.0)
Platelets: 439 10*3/uL — ABNORMAL HIGH (ref 150–400)
RBC: 5.82 MIL/uL — ABNORMAL HIGH (ref 3.87–5.11)
RDW: 14.4 % (ref 11.5–15.5)
WBC: 13.2 10*3/uL — ABNORMAL HIGH (ref 4.0–10.5)
nRBC: 0 % (ref 0.0–0.2)

## 2019-06-16 LAB — GLUCOSE, CAPILLARY
Glucose-Capillary: 299 mg/dL — ABNORMAL HIGH (ref 70–99)
Glucose-Capillary: 322 mg/dL — ABNORMAL HIGH (ref 70–99)
Glucose-Capillary: 278 mg/dL — ABNORMAL HIGH (ref 70–99)
Glucose-Capillary: 263 mg/dL — ABNORMAL HIGH (ref 70–99)
Glucose-Capillary: 251 mg/dL — ABNORMAL HIGH (ref 70–99)

## 2019-06-16 LAB — MAGNESIUM: Magnesium: 2.1 mg/dL (ref 1.7–2.4)

## 2019-06-16 MED ORDER — INSULIN STARTER KIT- PEN NEEDLES (ENGLISH)
1.0000 | Freq: Once | Status: AC
  Administered 2019-06-16: 1
  Filled 2019-06-16: qty 1

## 2019-06-16 MED ORDER — LIVING WELL WITH DIABETES BOOK
Freq: Once | Status: AC
  Filled 2019-06-16: qty 1

## 2019-06-16 MED ORDER — INSULIN GLARGINE 100 UNIT/ML ~~LOC~~ SOLN
24.0000 [IU] | SUBCUTANEOUS | Status: DC
  Administered 2019-06-16: 24 [IU] via SUBCUTANEOUS
  Filled 2019-06-16 (×2): qty 0.24

## 2019-06-16 MED ORDER — IOHEXOL 350 MG/ML SOLN
100.0000 mL | Freq: Once | INTRAVENOUS | Status: AC | PRN
  Administered 2019-06-16: 100 mL via INTRAVENOUS

## 2019-06-16 NOTE — Progress Notes (Signed)
Inpatient Diabetes Program Recommendations  AACE/ADA: New Consensus Statement on Inpatient Glycemic Control (2015)  Target Ranges:  Prepandial:   less than 140 mg/dL      Peak postprandial:   less than 180 mg/dL (1-2 hours)      Critically ill patients:  140 - 180 mg/dL   Lab Results  Component Value Date   GLUCAP 322 (H) 06/16/2019   HGBA1C 9.9 (H) 06/14/2019    Review of Glycemic Control  Educated patient's Mother on insulin pen use at home. Reviewed contents of insulin flexpen starter kit. Reviewed all steps if insulin pen including attachment of needle, 2-unit air shot, dialing up dose, giving injection, removing needle, disposal of sharps, storage of unused insulin, disposal of insulin etc. Patient able to provide successful return demonstration. Also reviewed troubleshooting with insulin pen. MD to give patient Rxs for insulin pens and insulin pen needles.  Reviewed glucose and A1C goals and explained that patient will need to f/u with PCP within a week of discharge.  Reviewed signs and symptoms of hyperglycemia and hypoglycemia along with treatment for both. Discussed impact of nutrition, exercise, stress, sickness, and medications on diabetes control. Reviewed Living Well with diabetes booklet and encouraged patient's Mom to read through entire book.   Pt's Mom prefers insulin pens! May need TOC consult for copay info on Lantus/Levemir pen and Novolog/Humalog pen.  Will f/u in am.   Thank you. Lorenda Peck, RD, LDN, CDE Inpatient Diabetes Coordinator [phone]

## 2019-06-16 NOTE — Progress Notes (Addendum)
PROGRESS NOTE    Amanda Snyder  LKT:[id number] DOB: 1995/05/31 DOA: 06/14/2019 PCP: Leone Haven, MD  Brief Narrative:  HPI per Dr. Zada Finders on 06/15/19 Amanda Snyder is a 24 y.o. female with medical history significant for severe intellectual disability and type 2 diabetes who presents to the ED from urgent care for evaluation of hyperglycemia and nausea, vomiting, abdominal pain.  Patient unable to provide history therefore entire history is obtained from mother at bedside, EDP, and chart review.  Mother reports patient has been having about 1 week of frequent urination.  She has recently been complaining of abdominal pain.  She has had nausea with one episode of scant emesis.  She had a telemedicine visit with her PCP who recommended she present to the urgent care for an in person abdominal examination.  In urgent care she was noted to be hyperglycemic with blood sugar >600.  Urine dip showed negative nitrates, negative leukocytes, > 1000 glucose, negative ketones.  Patient was sent to the ED for further evaluation management.  ED Course:  Initial vitals showed BP 150/98, pulse 111, RR 20, SPO2 100% on room air.  Labs show serum glucose 550, sodium 132, potassium 4.1, bicarb 21, anion gap 16, BUN 9, creatinine 0.86, AST 53, ALT 81, alk phos 83, total bilirubin 0.7, lipase 22, beta hydroxy butyrate 0.7.  VBG shows pH 7.413, PCO2 37.1, PO2 64.  Urinalysis is collected and pending.  SARS-CoV-2 PCR is ordered and pending.  Patient was given 2 L LR, 1 L normal saline, and started on insulin infusion.  The hospitalist service was consulted to admit for further evaluation and management.  Review of Systems:  Unable to obtain full review of systems due to patient's mental status  **Interim History  Blood sugar still remains elevated but is improving with adjustments.  Still has some sinus tachycardia.  No chest pain, nausea or vomiting patient does not appear to be in any  distress.  Mother had a lot of questions about her diabetes diagnosis.  Assessment & Plan:   Principal Problem:   Severe hyperglycemia due to diabetes mellitus (HCC) Active Problems:   Severe intellectual disabilities  DKA/Hyperglycemia in setting of New onset Type 2 Diabetes Metabolic Acidosis  -Home diabetes regimen includes Metformin and Januvia, not on insulin therapy.   -Initial CBG >600, bicarb 21, anion gap 16, VBG pH 7.413, beta hydroxybutyrate 0.7. -His death a mild metabolic acidosis with a CO2 21, chloride level 102, and anion gap 14 -Outside urine dipstick was negative for ketones or evidence of UTI. -Continued Insulin infusion per Hyperglycemia protocol and transitioned off and now is on long-acting insulin and was placed on 10 units but will increase this to 24 units of Lantus -Continue with moderate NovoLog/scale insulin AC and at bedtime -Now on a Carb Modified Diet -Continued IV NS @ 125/hr and transition to D5-1/2 NS when CBG <250 and now changed and is now on NS at 100 mL/hr -C/w D50W prn Low Blood Sugar  -Unclear why she was so hyperglycemic but leukocytosis has resolved, urinalysis was unrevealing for infection, and CT of the Abd/Pelvis showed "No acute CT finding to account for the patient's symptoms. Marked fatty infiltration of the liver with mild hepatomegaly and two foci of probable focal fatty sparing at the gallbladder fossa. Small hiatal hernia.  Mild diverticulosis coli and moderate stool burden without evidence of acute diverticulitis or bowel obstruction." -Check CTA chest to rule out PE -Continue Holding Metformin and Januvia -Checked Hemoglobin  A1c and was 9.9 -CBGs now ranging from 280-322 -Consulted diabetes education coordinator for further assistance in management and diabetic teaching  Sinus Tachycardia -Unknown etiology, could be related to dehydration vs ?anxiety/?pain/frequent movement -Saturating well on room air -EKG with sinus tachy, with  prolonged QTC -Chest x-ray unremarkable -D-dimer mildly elevated at 0.65 -Hydrated for another 24 hours, if persistent, may consider CT chest to rule out PE or an ECHO -because she said persistent sinus tachycardia will obtain a CTA of the chest to rule out PE and it showed "No demonstrable pulmonary embolus. No thoracic aortic aneurysm or dissection. Lungs clear. No adenopathy. Small hiatal hernia. Hepatic steatosis." -Check TSH  -Continue with Telemetry Monitoring  Leukocytosis -Likely reactive and improved. WBC went from 11.0 -> 13.2 -> 7.4 -Continue to Monitor for S/Sx of Infection -Repeat CBC in AM  -CTA as above  Nausea/Abdominal Pain -Likely related to hyperglycemia and constipation.   -Fatty liver noted on CT which is chronic per patient's mother. -Antiemetics as needed -Start bowel regimen for Constipation  -CT Abdomen and Pelvis showed No acute CT finding to account for the patient's symptoms. Marked fatty infiltration of the liver with mild hepatomegaly and two foci of probable focal fatty sparing at the gallbladder fossa. Small hiatal hernia.  Mild diverticulosis coli and moderate stool burden without evidence of acute diverticulitis or bowel obstruction.  Hepatic Steatosis/Fatty Liver Infiltration  Abnormal LFTs -Given the patient's mother weight loss and dietary counseling -LFTs were likely elevated in the setting of her fatty infiltration and hepatic steatosis -Continue monitor and trend and repeat CMP in a.m. -CT Scan of the abdomen pelvis as above and showed fatty infiltration of the liver with mild hepatomegaly with 2 foci possible focal fatty sparing at the gallbladder fossa repeat CT of the chest showed hepatic steatosis -Consider ultrasound of the gallbladder and liver if worsening -Continue monitor and trend hepatic function panel and repeat CMP in a.m.  Severe Intellectual Disability -Chronic and stable.   -Patient ambulates on her own per mother.  Super  Morbid Obesity -Estimated body mass index is 52.03 kg/m as calculated from the following:   Height as of this encounter: 5' 1"  (1.549 m).   Weight as of this encounter: 124.9 kg. -Weight Loss and Dietary Counseling given to Mother as Patient has Severe Intellectual Disability   Microcytic Anemia -Patient's hemoglobin/hematocrit went from 16.3/40.0 on admission is that she is likely hemoconcentrated and dehydrated and is now 11.1/35.4 after fluid resuscitation -Check anemia panel in the a.m. -Continue to monitor for signs and symptoms of bleeding; currently no overt bleeding noted -Make obtain a FOBT continues to drop -Repeat CBC in a.m.  DVT prophylaxis: Enoxaparin 40 mg sq q24h Code Status: FULL CODE Family Communication: Discussed in detail with patient's Mother at bedside  Disposition Plan: Patient from Home and will obtain PT/OT to evaluate and treat prior to D/C. Will need to ensure patient's HR is improved, Blood Sugars are stable, Mother has adequate Diabetic Teaching prior to D/C. Anticipate D/C in the next 24-48 hours   Consultants:   None   Procedures: CTA of [place]   Antimicrobials:  Anti-infectives (From admission, onward)   None     Subjective: Seen and examined at bedside she was still tachycardic but was restless in the bed.  Blood sugars are still a little uncontrolled.  No nausea or vomiting per the mother.  Patient's mother states that she is not any pain.  Mother had a lot of questions about her  diabetes concerns or plans at this time and all questions were answered to the patient's mother's satisfaction.  Patient is unable to provide a subjective history given her intellectual disability.  Objective: Vitals:   06/15/19 2205 06/15/19 2356 06/16/19 0033 06/16/19 0809  BP:      Pulse: (!) 112 (!) 114 (!) 110   Resp: 16 (!) 47 (!) 21   Temp:  97.8 F (36.6 C)  97.7 F (36.5 C)  TempSrc:  Oral  Axillary  SpO2: 96% 99% 94%   Weight:      Height:         Intake/Output Summary (Last 24 hours) at 06/16/2019 1128 Last data filed at 06/16/2019 0800 Gross per 24 hour  Intake 1955 ml  Output --  Net 1955 ml   Filed Weights   06/15/19 2028  Weight: 124.9 kg   Examination: Physical Exam:  Constitutional: WN/WD morbidly obese African-American female is slightly agitated and unable to provide a subjective history due to her intellectual disability Eyes: Lids and conjunctivae normal, sclerae anicteric  ENMT: External Ears, Nose appear normal. Grossly normal hearing. Neck: Appears normal, supple, no cervical masses, normal ROM, no appreciable thyromegaly; no JVD Respiratory: Diminished to auscultation bilaterally, no wheezing, rales, rhonchi or crackles.  Slightly increased respiratory effort but not evaluate supplemental oxygen via nasal cannula Cardiovascular: Tachycardic rate but regular rhythm, no murmurs / rubs / gallops. S1 and S2 auscultated. No extremity edema. 2+ pedal pulses. No carotid bruits.  Abdomen: Soft, non-tender, distended secondary body habitus. Bowel sounds positive x4.  GU: Deferred. Musculoskeletal: No clubbing / cyanosis of digits/nails. No joint deformity upper and lower extremities. Good ROM, no contractures. Normal strength and muscle tone.  Skin: No rashes, lesions, ulcers. No induration; Warm and dry.  Neurologic: CN 2-12 grossly intact with no focal deficits but does not follow commands. Romberg sign cerebellar reflexes not assessed.  Psychiatric: Impaired judgment and insight.  She is awake and alert.  Slightly agitated  Data Reviewed: I have personally reviewed following labs and imaging studies  CBC: Recent Labs  Lab 06/14/19 1922 06/14/19 1941 06/15/19 0218 06/16/19 0348  WBC 11.0*  --  13.2* 7.4  NEUTROABS  --   --   --  3.0  HGB 13.7 16.3* 12.7 11.1*  HCT 45.0 48.0* 42.3 35.4*  MCV 72.6*  --  72.7* 70.7*  PLT 408*  --  439* 725   Basic Metabolic Panel: Recent Labs  Lab 06/14/19 1930  06/14/19 1930 06/14/19 1941 06/15/19 0218 06/15/19 0752 06/15/19 1518 06/16/19 0348  NA 132*   < > 133* 136 136 135 137  K 4.1   < > 3.9 2.8* 3.4* 3.8 3.7  CL 95*  --   --  98 100 100 102  CO2 21*  --   --  25 23 22  21*  GLUCOSE 550*  --   --  261* 204* 316* 324*  BUN 9  --   --  6 5* 5* 6  CREATININE 0.86  --   --  0.78 0.64 0.85 0.63  CALCIUM 9.1  --   --  9.0 8.3* 8.1* 8.0*  MG  --   --   --   --   --  1.6* 2.1   < > = values in this interval not displayed.   GFR: Estimated Creatinine Clearance: 133.4 mL/min (by C-G formula based on SCr of 0.63 mg/dL). Liver Function Tests: Recent Labs  Lab 06/14/19 1930  AST 53*  ALT  81*  ALKPHOS 83  BILITOT 0.7  PROT 8.0  ALBUMIN 4.0   Recent Labs  Lab 06/14/19 1930  LIPASE 22   No results for input(s): AMMONIA in the last 168 hours. Coagulation Profile: No results for input(s): INR, PROTIME in the last 168 hours. Cardiac Enzymes: No results for input(s): CKTOTAL, CKMB, CKMBINDEX, TROPONINI in the last 168 hours. BNP (last 3 results) No results for input(s): PROBNP in the last 8760 hours. HbA1C: Recent Labs    06/14/19 1922  HGBA1C 9.9*   CBG: Recent Labs  Lab 06/15/19 1739 06/15/19 2001 06/15/19 2221 06/16/19 0358 06/16/19 0811  GLUCAP 295* 280* 297* 299* 322*   Lipid Profile: No results for input(s): CHOL, HDL, LDLCALC, TRIG, CHOLHDL, LDLDIRECT in the last 72 hours. Thyroid Function Tests: No results for input(s): TSH, T4TOTAL, FREET4, T3FREE, THYROIDAB in the last 72 hours. Anemia Panel: No results for input(s): VITAMINB12, FOLATE, FERRITIN, TIBC, IRON, RETICCTPCT in the last 72 hours. Sepsis Labs: No results for input(s): PROCALCITON, LATICACIDVEN in the last 168 hours.  Recent Results (from the past 240 hour(s))  SARS CORONAVIRUS 2 (TAT 6-24 HRS) Nasopharyngeal Nasopharyngeal Swab     Status: None   Collection Time: 06/15/19 12:37 AM   Specimen: Nasopharyngeal Swab  Result Value Ref Range Status    SARS Coronavirus 2 NEGATIVE NEGATIVE Final    Comment: (NOTE) SARS-CoV-2 target nucleic acids are NOT DETECTED. The SARS-CoV-2 RNA is generally detectable in upper and lower respiratory specimens during the acute phase of infection. Negative results do not preclude SARS-CoV-2 infection, do not rule out co-infections with other pathogens, and should not be used as the sole basis for treatment or other patient management decisions. Negative results must be combined with clinical observations, patient history, and epidemiological information. The expected result is Negative. Fact Sheet for Patients: SugarRoll.be Fact Sheet for Healthcare Providers: https://www.woods-mathews.com/ This test is not yet approved or cleared by the [place] FDA and  has been authorized for detection and/or diagnosis of SARS-CoV-2 by FDA under an Emergency Use Authorization (EUA). This EUA will remain  in effect (meaning this test can be used) for the duration of the COVID-19 declaration under Section 56 4(b)(1) of the Act, 21 U.S.C. section 360bbb-3(b)(1), unless the authorization is terminated or revoked sooner. Performed at [facility], [place] [address], [place], [place] [address]     RN Pressure Injury Documentation:     Estimated body mass index is 52.03 kg/m as calculated from the following:   Height as of this encounter: 5' 1"  (1.549 m).   Weight as of this encounter: 124.9 kg.  Malnutrition Type:      Malnutrition Characteristics:      Nutrition Interventions:     Radiology Studies: CT ANGIO CHEST PE W OR WO CONTRAST  Result Date: 06/16/2019 CLINICAL DATA:  Shortness of breath and tachycardia EXAM: CT ANGIOGRAPHY CHEST WITH CONTRAST TECHNIQUE: Multidetector CT imaging of the chest was performed using the standard protocol during bolus administration of intravenous contrast. Multiplanar CT image reconstructions and MIPs were obtained to  evaluate the vascular anatomy. CONTRAST:  128m OMNIPAQUE IOHEXOL 350 MG/ML SOLN COMPARISON:  Chest radiograph June 15, 2019 FINDINGS: Cardiovascular: There is no demonstrable pulmonary embolus. There is no thoracic aortic aneurysm or dissection. Visualized great vessels appear unremarkable. Note that the right innominate and left common carotid arteries arise as a common trunk, an anatomic variant. There is no evident pericardial effusion or pericardial thickening. Mediastinum/Nodes: Thyroid appears normal. No evident thoracic  adenopathy. There is a small hiatal hernia. Lungs/Pleura: Lungs are clear.  No pleural effusions are evident. Upper Abdomen: There is hepatic steatosis. Visualized upper abdominal structures otherwise appear unremarkable. Musculoskeletal: No blastic or lytic bone lesions are evident. No chest wall lesions. Review of the MIP images confirms the above findings. IMPRESSION: 1. No demonstrable pulmonary embolus. No thoracic aortic aneurysm or dissection. 2.  Lungs clear. 3.  No adenopathy. 4.  Small hiatal hernia. 5.  Hepatic steatosis. Electronically Signed   By: Lowella Grip III M.D.   On: 06/16/2019 11:12   CT Abdomen Pelvis W Contrast  Result Date: 06/14/2019 CLINICAL DATA:  Vomiting the past 2 days. Elevated blood sugars. EXAM: CT ABDOMEN AND PELVIS WITH CONTRAST TECHNIQUE: Multidetector CT imaging of the abdomen and pelvis was performed using the standard protocol following bolus administration of intravenous contrast. Automatic exposure control utilized. CONTRAST:  183m OMNIPAQUE IOHEXOL 300 MG/ML  SOLN COMPARISON:  None. FINDINGS: Lower chest: Normal heart size. No pneumonia or dependent pleural fluid. Small hiatal hernia. Respiratory motion markedly degrading evaluation of the partially imaged superior mediastinal soft tissues. Hepatobiliary: Marked fatty infiltration of the liver with probable foci of focal fatty sparing measuring 16 mm and 11 mm at the gallbladder fossa.  Normal gallbladder and biliary tree. Mild hepatomegaly. Patent portal and hepatic veins. Normal smooth hepatic contours without cirrhosis or ascites. Pancreas: No acute pancreatitis or apparent abnormality. Spleen: Normal. Adrenals/Urinary Tract: Normal. Stomach/Bowel: No apparent gastric wall thickening or perigastric inflammatory change. Decompressed small bowel without obstruction. Normal appendix, series 3, image 65. Mild diverticulosis coli and moderate colonic stool burden. No apparent bowel wall thickening. 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 Left dual renal veins, retroaortic and pre aortic. No abdominal or pelvic adenopathy. Small nonspecific subcentimeter short axis diameter lymph node at the diaphragmatic crus. No abdominal abscess, free intraperitoneal fluid or air. Reproductive: Normally sized uterus with normal uterine contours. Symmetrical size of both ovaries and a bilateral normal ovarian follicle. Other: Small periumbilical herniation of fat without strangulation or incarceration. Musculoskeletal: Mild skeletal degenerative change. Probable benign bone island in the left acetabulum IMPRESSION: 1. No acute CT finding to account for the patient's symptoms. 2. Marked fatty infiltration of the liver with mild hepatomegaly and two foci of probable focal fatty sparing at the gallbladder fossa. 3. Small hiatal hernia. 4. Mild diverticulosis coli and moderate stool burden without evidence of acute diverticulitis or bowel obstruction. Electronically Signed   By: RRevonda Humphrey  On: 06/14/2019 23:18   DG Chest Port 1 View  Result Date: 06/15/2019 CLINICAL DATA:  24year old with shortness of breath. EXAM: PORTABLE CHEST 1 VIEW COMPARISON:  None. FINDINGS: Markedly suboptimal inspiration which accounts for crowded bronchovascular markings diffusely, RIGHT basilar atelectasis, and which accentuates the heart size. Taking this  into account, cardiomediastinal silhouette unremarkable. Lungs otherwise clear. Normal pulmonary vascularity. IMPRESSION: Markedly suboptimal inspiration which accounts for RIGHT basilar atelectasis. No acute cardiopulmonary disease otherwise. Electronically Signed   By: TEvangeline DakinM.D.   On: 06/15/2019 13:31   Scheduled Meds: . enoxaparin (LOVENOX) injection  40 mg Subcutaneous Q24H  . insulin aspart  0-15 Units Subcutaneous TID WC  . insulin aspart  0-5 Units Subcutaneous QHS  . insulin glargine  24 Units Subcutaneous Q24H  . insulin starter kit- pen needles  1 kit Other Once  . living well with diabetes book   Does not apply Once  . polyethylene glycol  17 g Oral BID  . senna-docusate  1 tablet Oral BID  . sodium chloride flush  3 mL Intravenous Q12H   Continuous Infusions: . sodium chloride 100 mL/hr at 06/16/19 0946    LOS: 1 day   Kerney Elbe, DO [facility] PAGER is on AMION  If 7PM-7AM, please contact night-coverage [email or web address]

## 2019-06-16 NOTE — Progress Notes (Signed)
Inpatient Diabetes Program Recommendations  AACE/ADA: New Consensus Statement on Inpatient Glycemic Control (2015)  Target Ranges:  Prepandial:   less than 140 mg/dL      Peak postprandial:   less than 180 mg/dL (1-2 hours)      Critically ill patients:  140 - 180 mg/dL   Lab Results  Component Value Date   GLUCAP 322 (H) 06/16/2019   HGBA1C 9.9 (H) 06/14/2019    Review of Glycemic Control  To teach insulin pen to Mother. Will order OP Diabetes Education consult for pt's Mother  Long discussion on 3/9 with pt's Mom in ED, regarding new diagnosis. Mom states pt was "pre-diabetic" even though pt was on metformin and Januvia.  Discussed survival skills and importance of f/u with PCP for diabetes management.   Thank you. Lorenda Peck, RD, LDN, CDE Inpatient Diabetes Coordinator [phone]

## 2019-06-16 NOTE — Progress Notes (Signed)
Inpatient Diabetes Program Recommendations  AACE/ADA: New Consensus Statement on Inpatient Glycemic Control (2015)  Target Ranges:  Prepandial:   less than 140 mg/dL      Peak postprandial:   less than 180 mg/dL (1-2 hours)      Critically ill patients:  140 - 180 mg/dL   Lab Results  Component Value Date   GLUCAP 299 (H) 06/16/2019   HGBA1C 9.9 (H) 06/14/2019   Results for Amanda Snyder, Amanda Snyder (MRN [id number]) as of 06/16/2019 07:39  Ref. Range 06/14/2019 20:36 06/14/2019 23:35 06/15/2019 01:33 06/15/2019 02:32 06/15/2019 03:43 06/15/2019 05:32  Glucose-Capillary Latest Ref Range: 70 - 99 mg/dL 447 (H) 336 (H) 300 (H)  IV insulin 9.5 units/hr 223 (H)  IV insulin 4.4 units/hr 208 (H) 245 (H)  IV insulin  6.5 units/hr   Review of GlyResults for CAMISHA, SREY (MRN [id number]) as of 06/16/2019 07:39  Ref. Range 06/15/2019 06:40 06/15/2019 07:25 06/15/2019 08:53 06/15/2019 10:39 06/15/2019 11:55 06/15/2019 12:39 06/15/2019 16:39 06/15/2019 17:39 06/15/2019 20:01 06/15/2019 22:21 06/16/2019 03:58  Glucose-Capillary Latest Ref Range: 70 - 99 mg/dL 193 (H)  IV insulin 4.4units/hr 201 (H)  IV insulin 4.4units/hr 173 (H)  IV insulin 2.8units/hr 159 (H)  Lantus 10 units 148 (H)  IV insulin 2.8units/hr 159 (H)  IV insulin 1.9units/hr 306 (H)  Novolog 10units 295 (H) 280 (H)  Novolog 8units 297 (H) 299 (H)   Results for DESHAE, DICKISON (MRN [id number]) as of 06/16/2019 07:39  Ref. Range 06/14/2019 19:22  Hemoglobin A1C Latest Ref Range: 4.8 - 5.6 % 9.9 (H)     Diabetes history: DM2 Outpatient Diabetes medications: Januvia 100 mg daily, Metformin 850 mg twice daily Current orders for Inpatient glycemic control: Novolog 0-15 units TID, 0-5 units at bedtime, Lantus 10 units daily  Inpatient Diabetes Program Recommendations:     Patient received 48.6 units while on IV insulin over a 12 hour period. Please consider Lantus 24 unit daily (0.2units/kg)  Thank you, Reche Dixon, RN, BSN Diabetes  Coordinator Inpatient Diabetes Program [phone] (team pager from 8a-5p)

## 2019-06-17 DIAGNOSIS — E876 Hypokalemia: Secondary | ICD-10-CM

## 2019-06-17 LAB — CBC WITH DIFFERENTIAL/PLATELET
Abs Immature Granulocytes: 0.02 10*3/uL (ref 0.00–0.07)
Basophils Absolute: 0 10*3/uL (ref 0.0–0.1)
Basophils Relative: 0 %
Eosinophils Absolute: 0.2 10*3/uL (ref 0.0–0.5)
Eosinophils Relative: 4 %
HCT: 36.7 % (ref 36.0–46.0)
Hemoglobin: 11.3 g/dL — ABNORMAL LOW (ref 12.0–15.0)
Immature Granulocytes: 0 %
Lymphocytes Relative: 39 %
Lymphs Abs: 2.1 10*3/uL (ref 0.7–4.0)
MCH: 21.9 pg — ABNORMAL LOW (ref 26.0–34.0)
MCHC: 30.8 g/dL (ref 30.0–36.0)
MCV: 71 fL — ABNORMAL LOW (ref 80.0–100.0)
Monocytes Absolute: 0.4 10*3/uL (ref 0.1–1.0)
Monocytes Relative: 8 %
Neutro Abs: 2.7 10*3/uL (ref 1.7–7.7)
Neutrophils Relative %: 49 %
Platelets: 325 10*3/uL (ref 150–400)
RBC: 5.17 MIL/uL — ABNORMAL HIGH (ref 3.87–5.11)
RDW: 14.2 % (ref 11.5–15.5)
WBC: 5.5 10*3/uL (ref 4.0–10.5)
nRBC: 0 % (ref 0.0–0.2)

## 2019-06-17 LAB — GLUCOSE, CAPILLARY
Glucose-Capillary: 250 mg/dL — ABNORMAL HIGH (ref 70–99)
Glucose-Capillary: 292 mg/dL — ABNORMAL HIGH (ref 70–99)
Glucose-Capillary: 297 mg/dL — ABNORMAL HIGH (ref 70–99)
Glucose-Capillary: 337 mg/dL — ABNORMAL HIGH (ref 70–99)
Glucose-Capillary: 361 mg/dL — ABNORMAL HIGH (ref 70–99)

## 2019-06-17 LAB — PHOSPHORUS: Phosphorus: 2.1 mg/dL — ABNORMAL LOW (ref 2.5–4.6)

## 2019-06-17 LAB — TSH: TSH: 2.426 u[IU]/mL (ref 0.350–4.500)

## 2019-06-17 LAB — VITAMIN B12: Vitamin B-12: 470 pg/mL (ref 180–914)

## 2019-06-17 LAB — FOLATE: Folate: 5.9 ng/mL — ABNORMAL LOW (ref >5.9)

## 2019-06-17 LAB — COMPREHENSIVE METABOLIC PANEL
ALT: 51 U/L — ABNORMAL HIGH (ref 0–44)
AST: 37 U/L (ref 15–41)
Albumin: 2.9 g/dL — ABNORMAL LOW (ref 3.5–5.0)
Alkaline Phosphatase: 57 U/L (ref 38–126)
Anion gap: 10 (ref 5–15)
BUN: <5 mg/dL — ABNORMAL LOW (ref 6–20)
CO2: 21 mmol/L — ABNORMAL LOW (ref 22–32)
Calcium: 7.8 mg/dL — ABNORMAL LOW (ref 8.9–10.3)
Chloride: 103 mmol/L (ref 98–111)
Creatinine, Ser: 0.56 mg/dL (ref 0.44–1.00)
GFR calc Af Amer: >60 mL/min (ref >60)
GFR calc non Af Amer: >60 mL/min (ref >60)
Glucose, Bld: 384 mg/dL — ABNORMAL HIGH (ref 70–99)
Potassium: 3.4 mmol/L — ABNORMAL LOW (ref 3.5–5.1)
Sodium: 134 mmol/L — ABNORMAL LOW (ref 135–145)
Total Bilirubin: 1.1 mg/dL (ref 0.3–1.2)
Total Protein: 6.4 g/dL — ABNORMAL LOW (ref 6.5–8.1)

## 2019-06-17 LAB — FERRITIN: Ferritin: 69 ng/mL (ref 11–307)

## 2019-06-17 LAB — RETICULOCYTES
Immature Retic Fract: 12.3 % (ref 2.3–15.9)
RBC.: 5.14 MIL/uL — ABNORMAL HIGH (ref 3.87–5.11)
Retic Count, Absolute: 74.5 10*3/uL (ref 19.0–186.0)
Retic Ct Pct: 1.5 % (ref 0.4–3.1)

## 2019-06-17 LAB — IRON AND TIBC
Iron: 80 ug/dL (ref 28–170)
Saturation Ratios: 28 % (ref 10.4–31.8)
TIBC: 287 ug/dL (ref 250–450)
UIBC: 207 ug/dL

## 2019-06-17 LAB — MAGNESIUM: Magnesium: 1.8 mg/dL (ref 1.7–2.4)

## 2019-06-17 MED ORDER — SODIUM BICARBONATE 650 MG PO TABS
650.0000 mg | ORAL_TABLET | Freq: Two times a day (BID) | ORAL | Status: AC
  Administered 2019-06-17 (×2): 650 mg via ORAL
  Filled 2019-06-17 (×2): qty 1

## 2019-06-17 MED ORDER — INSULIN ASPART 100 UNIT/ML ~~LOC~~ SOLN
0.0000 [IU] | Freq: Every day | SUBCUTANEOUS | Status: DC
  Administered 2019-06-17: 4 [IU] via SUBCUTANEOUS
  Administered 2019-06-18: 3 [IU] via SUBCUTANEOUS

## 2019-06-17 MED ORDER — INSULIN ASPART 100 UNIT/ML ~~LOC~~ SOLN
3.0000 [IU] | Freq: Three times a day (TID) | SUBCUTANEOUS | Status: DC
  Administered 2019-06-17 (×2): 3 [IU] via SUBCUTANEOUS

## 2019-06-17 MED ORDER — POTASSIUM PHOSPHATES 15 MMOLE/5ML IV SOLN
20.0000 mmol | Freq: Once | INTRAVENOUS | Status: AC
  Administered 2019-06-17: 20 mmol via INTRAVENOUS
  Filled 2019-06-17: qty 6.67

## 2019-06-17 MED ORDER — INSULIN GLARGINE 100 UNIT/ML ~~LOC~~ SOLN
28.0000 [IU] | SUBCUTANEOUS | Status: DC
  Administered 2019-06-17: 28 [IU] via SUBCUTANEOUS
  Filled 2019-06-17 (×2): qty 0.28

## 2019-06-17 MED ORDER — INSULIN ASPART 100 UNIT/ML ~~LOC~~ SOLN
0.0000 [IU] | Freq: Three times a day (TID) | SUBCUTANEOUS | Status: DC
  Administered 2019-06-17 – 2019-06-18 (×3): 11 [IU] via SUBCUTANEOUS
  Administered 2019-06-18: 7 [IU] via SUBCUTANEOUS
  Administered 2019-06-18: 11 [IU] via SUBCUTANEOUS
  Administered 2019-06-19: 15 [IU] via SUBCUTANEOUS
  Administered 2019-06-19: 7 [IU] via SUBCUTANEOUS

## 2019-06-17 MED ORDER — SODIUM CHLORIDE 0.9 % IV SOLN: INTRAVENOUS | Status: AC

## 2019-06-17 MED ORDER — POTASSIUM CHLORIDE CRYS ER 20 MEQ PO TBCR
40.0000 meq | EXTENDED_RELEASE_TABLET | Freq: Once | ORAL | Status: AC
  Administered 2019-06-17: 40 meq via ORAL
  Filled 2019-06-17: qty 2

## 2019-06-17 MED ORDER — POTASSIUM CHLORIDE CRYS ER 10 MEQ PO TBCR
EXTENDED_RELEASE_TABLET | ORAL | Status: AC
  Filled 2019-06-17: qty 1

## 2019-06-17 NOTE — Progress Notes (Signed)
Nutrition Consult Diet Education  RD consulted for nutrition education regarding diabetes.   Lab Results  Component Value Date   HGBA1C 9.9 (H) 06/14/2019    RD provided "Carbohydrate Counting for People with Diabetes" handout from the Academy of Nutrition and Dietetics. Discussed with sister different food groups and their effects on blood sugar, emphasizing carbohydrate-containing foods. Provided list of carbohydrates and recommended serving sizes of common foods.  Discussed importance of controlled and consistent carbohydrate intake throughout the day. Provided examples of ways to balance meals/snacks and encouraged intake of high-fiber, whole grain complex carbohydrates. Teach back method used.  Expect good compliance.  Body mass index is 52.03 kg/m. Pt meets criteria for class 3, extreme/morbid obesity based on current BMI.  Malnutrition Screening Tool score = 3. Sister reports that patient has lost a little weight recently. From review of usual weights, patient has had 8% weight loss within the past 6 months, which is not significant for the time frame. She has been eating well.   Current diet order is CHO modified, patient is consuming approximately 90-100% of meals at this time. Labs and medications reviewed. No further nutrition interventions warranted at this time. RD contact information provided. If additional nutrition issues arise, please re-consult RD.  Molli Barrows, RD, LDN, CNSC Please refer to [facility] for contact information.

## 2019-06-17 NOTE — Progress Notes (Signed)
   06/17/19 1956  MEWS Score  MEWS Score 4  No signs of distress.This is not an acute change. Will continue to monitor pt

## 2019-06-17 NOTE — Progress Notes (Signed)
PROGRESS NOTE    Amanda Snyder  RCV:[id number] DOB: Jun 23, 1995 DOA: 06/14/2019 PCP: Leone Haven, MD  Brief Narrative:  HPI per Dr. Zada Finders on 06/15/19 Amanda Snyder is a 24 y.o. female with medical history significant for severe intellectual disability and type 2 diabetes who presents to the ED from urgent care for evaluation of hyperglycemia and nausea, vomiting, abdominal pain.  Patient unable to provide history therefore entire history is obtained from mother at bedside, EDP, and chart review.  Mother reports patient has been having about 1 week of frequent urination.  She has recently been complaining of abdominal pain.  She has had nausea with one episode of scant emesis.  She had a telemedicine visit with her PCP who recommended she present to the urgent care for an in person abdominal examination.  In urgent care she was noted to be hyperglycemic with blood sugar >600.  Urine dip showed negative nitrates, negative leukocytes, > 1000 glucose, negative ketones.  Patient was sent to the ED for further evaluation management.  ED Course:  Initial vitals showed BP 150/98, pulse 111, RR 20, SPO2 100% on room air.  Labs show serum glucose 550, sodium 132, potassium 4.1, bicarb 21, anion gap 16, BUN 9, creatinine 0.86, AST 53, ALT 81, alk phos 83, total bilirubin 0.7, lipase 22, beta hydroxy butyrate 0.7.  VBG shows pH 7.413, PCO2 37.1, PO2 64.  Urinalysis is collected and pending.  SARS-CoV-2 PCR is ordered and pending.  Patient was given 2 L LR, 1 L normal saline, and started on insulin infusion.  The hospitalist service was consulted to admit for further evaluation and management.  Review of Systems:  Unable to obtain full review of systems due to patient's mental status  **Interim History  Blood sugar still remains elevated but is improving with adjustments and will further adjust given her hyperglycemia.  Still has some sinus tachycardia but this is slowly improving.   No chest pain, nausea or vomiting patient does not appear to be in any distress.  Mother had a lot of questions about her diabetes diagnosis which are currently being answered.  Mother needs diabetic teaching and will continue to further educate and have the diabetes coordinator work with the patient's mother for that she is comfortable not insulin administration.   Assessment & Plan:   Principal Problem:   Severe hyperglycemia due to diabetes mellitus (HCC) Active Problems:   Severe intellectual disabilities  DKA/Hyperglycemia in setting of New onset Type 2 Diabetes Metabolic Acidosis  -Home diabetes regimen includes Metformin and Januvia, not on insulin therapy but now likely will be given her elevated hemoglobin A1c -Initial CBG >600, bicarb 21, anion gap 16, VBG pH 7.413, beta hydroxybutyrate 0.7. -His death a mild metabolic acidosis with a CO2 21, chloride level 102, and anion gap 14 -Outside urine dipstick was negative for ketones or evidence of UTI. -Continued Insulin infusion per Hyperglycemia protocol and transitioned off and now is on long-acting insulin and was placed on 10 units but will increase this to 24 units of Lantus yesterday and today will increase to 20 units -Continued with moderate NovoLog/scale insulin AC and at bedtime because of her uncontrolled hyperglycemia in elevated blood sugars will increase the scale to resistant NovoLog sliding scale before meals and at bedtime We will also add premeal insulin 3 units 3 times daily with meals -Now on a Carb Modified Diet and have asked dietitian to further educate mother about the patient's diet -Continued IV NS @ 125/hr  and transition to D5-1/2 NS when CBG <250 and now changed and is now on NS at 100 mL/hr and this is now dropped to 50 MLS per hour for another 10 hours -C/w D50W prn Low Blood Sugar  -Unclear why she was so hyperglycemic but leukocytosis has resolved, urinalysis was unrevealing for infection, and CT of the  Abd/Pelvis showed "No acute CT finding to account for the patient's symptoms. Marked fatty infiltration of the liver with mild hepatomegaly and two foci of probable focal fatty sparing at the gallbladder fossa. Small hiatal hernia.  Mild diverticulosis coli and moderate stool burden without evidence of acute diverticulitis or bowel obstruction." -Check CTA chest to rule out PE and she did not have a PE and showed no demonstratable pulmonary embolus and no thoracic aortic aneurysm or dissection the lungs been clear no adenopathy and she had a small hiatal hernia and hepatic steatosis -Continue Holding Metformin and Januvia -Checked Hemoglobin A1c and was 9.9 -CBGs now ranging from 250-361 -Consulted diabetes education coordinator for further assistance in management and diabetic teaching  Sinus Tachycardia -Unknown etiology, could be related to dehydration vs ?anxiety/?pain/frequent movement -Saturating well on room air -EKG with sinus tachy, with prolonged QTC -Chest x-ray unremarkable -D-dimer mildly elevated at 0.65 -Hydrated for another 24 hours but rate has been reduced to 50 MLS per hour for another 10 hours -Because she said persistent sinus tachycardia will obtain a CTA of the chest to rule out PE and it showed "No demonstrable pulmonary embolus. No thoracic aortic aneurysm or dissection. Lungs clear. No adenopathy. Small hiatal hernia. Hepatic steatosis." -Checked TSH and was 2.426 -Continue with Telemetry Monitoring for now and it appears that her tachycardia is improving but may need a low-dose beta-blocker  Leukocytosis -Likely reactive and improved. WBC went from 11.0 -> 13.2 -> 7.4 -> 5.5 -Continue to Monitor for S/Sx of Infection -Repeat CBC in AM  -CTA as above  Nausea/Abdominal Pain -Likely related to hyperglycemia and constipation.   -Fatty liver and Hepatic Steatosis noted on CT which is chronic per patient's mother. -Antiemetics as needed -Start bowel regimen for  Constipation  -CT Abdomen and Pelvis showed No acute CT finding to account for the patient's symptoms. Marked fatty infiltration of the liver with mild hepatomegaly and two foci of probable focal fatty sparing at the gallbladder fossa. Small hiatal hernia.  Mild diverticulosis coli and moderate stool burden without evidence of acute diverticulitis or bowel obstruction.  Hepatic Steatosis/Fatty Liver Infiltration  Abnormal LFTs -Given the patient's mother weight loss and dietary counseling -LFTs were likely elevated in the setting of her fatty infiltration and hepatic steatosis -Continue monitor and trend and repeat CMP in a.m. -CT Scan of the abdomen pelvis as above and showed fatty infiltration of the liver with mild hepatomegaly with 2 foci possible focal fatty sparing at the gallbladder fossa repeat CT of the chest showed hepatic steatosis -Consider ultrasound of the gallbladder and liver if worsening -Continue monitor and trend hepatic function panel and repeat CMP in a.m.  Severe Intellectual Disability -Chronic and stable.   -Patient ambulates on her own per mother.  Super Morbid Obesity -Estimated body mass index is 52.03 kg/m as calculated from the following:   Height as of this encounter: 5' 1" (1.549 m).   Weight as of this encounter: 124.9 kg. -Weight Loss and Dietary Counseling given to Mother as Patient has Severe Intellectual Disability   Microcytic Anemia -Patient's hemoglobin/hematocrit went from 16.3/40.0 on admission is that she is likely  hemoconcentrated and dehydrated and is now 11.3/36.7 after fluid resuscitation -Checked anemia panel and showed an iron level of 80, U IBC of 207, TIBC of 287, saturation ratios of 20%, ferritin level 69, folate level 5.9, and vitamin B12 level 470 -Continue to monitor for signs and symptoms of bleeding; currently no overt bleeding noted -Make obtain a FOBT if continues to drop -Repeat CBC in a.m.  Hypophosphatemia -Patient's  phosphorus level this morning was 2.1 -Replete with p.o. IV K-Phos 20 mmol -Continue to monitor and replete as necessary -Repeat phosphorus in a.m.  Hypokalemia -Mild as the patient's potassium was 3.4  -Replete with IV K-Phos 20 mmol along with p.o. KCl 40 mg x 1 -Continue to monitor replete as necessary -Repeat CMP in a.m.  Hyponatremia -Mild as patient's sodium is 134 -Continue IV fluid hydration with normal saline but will reduce rate to 50 MLS per hour for another 10 more hours Continue to monitor and trend -Repeat CMP in a.m.  Metabolic Acidosis -Mild -Patient CO2 was 21, chloride was 103 and anion gap was 10 -Patient was given sodium bicarbonate tablets 650 mg p.o. twice daily x2 doses  -Continue to monitor and repeat CMP in a.m.  Questionable Absence Seizure -Patient's mother describes the patient staring off into space but is attributing this to her hyperglycemia -Patient's mother states that she does have a history of seizures but they are usually grand mal and she has not had them since the patient was a kid -Patient's mother states that she improved when she received the insulin -We will need to continue to monitor closely and will order LTM EEG with Video and I discussed this with Dr. Hortense Ramal  DVT prophylaxis: Enoxaparin 40 mg sq q24h Code Status: FULL CODE Family Communication: Discussed in detail with patient's Mother at bedside  Disposition Plan: Patient from Home and will obtain PT/OT to evaluate and treat prior to D/C. Will need to ensure patient's HR is improved, Blood Sugars are stable, Mother has adequate Diabetic Teaching prior to D/C. Anticipate D/C in the next 24-48 hours   Consultants:   None   Procedures: CTA of Chest LTM EEG    Antimicrobials:  Anti-infectives (From admission, onward)   None     Subjective: Seen and examined at bedside and the patient's mother states that the patient is doing a little bit better but she had a lot of questions  about her diagnosis of diabetes.  Patient has unable to provide a subjective history due to her current condition but remains a little agitated.  Mother thinks her heart rate and respiratory rate is improved.  Mother is happy to know that patient did not have any PE and that the lungs are clear.  All questions answered to patient's mother satisfaction and will continue to work to adjust her insulin and will also ensure the patient's mother is comfortable giving the patient insulin at home for safe discharge disposition.  Objective: Vitals:   06/16/19 1953 06/17/19 0036 06/17/19 0409 06/17/19 0826  BP: 131/84 130/88 125/89 (!) 139/100  Pulse: (!) 108 (!) 106 (!) 104 99  Resp: (!) 22 (!) _0 Temp: 98 F (36.7 C) 98.8 F (37.1 C) 98.5 F (36.9 C) 98.6 F (37 C)  TempSrc: Oral Oral Oral Axillary  SpO2: 97% 100% 93% 98%  Weight:      Height:        Intake/Output Summary (Last 24 hours) at 06/17/2019 1459 Last data filed at 06/17/2019 0400 Gross per  24 hour  Intake 1214.75 ml  Output 1450 ml  Net -235.25 ml   Filed Weights   06/15/19 2028  Weight: 124.9 kg   Examination: Physical Exam:  Constitutional: WN/WD morbidly obese African-American female who is slightly low agitated and still unable to provide subjective history due to her severe intellectual disability Eyes: Lids and conjunctivae normal, sclerae anicteric  ENMT: External Ears, Nose appear normal. Grossly normal hearing.  Neck: Appears normal, supple, no cervical masses, normal ROM, no appreciable thyromegaly; no JVD Respiratory: Slightly diminished to auscultation bilaterally, no wheezing, rales, rhonchi or crackles.  She is mildly tachypneic no accessory muscle use.  Unlabored breathing Cardiovascular: Tachycardic rate but regular rhythm, no murmurs / rubs / gallops. S1 and S2 auscultated. No extremity edema. Abdomen: Soft, non-tender, distended secondary body habitus. Bowel sounds positive x4  GU:  Deferred. Musculoskeletal: No clubbing / cyanosis of digits/nails. No joint deformity upper and lower extremities.  Skin: No rashes, lesions, ulcers on a limited skin evaluation. No induration; Warm and dry.  Neurologic: CN 2-12 grossly intact with no focal deficits. Romberg sign and cerebellar reflexes not assessed.  Psychiatric: Impaired judgment and insight.  She is awake but not alert and she is slightly agitated moving around in her bed.  Data Reviewed: I have personally reviewed following labs and imaging studies  CBC: Recent Labs  Lab 06/14/19 1922 06/14/19 1941 06/15/19 0218 06/16/19 0348 06/17/19 0759  WBC 11.0*  --  13.2* 7.4 5.5  NEUTROABS  --   --   --  3.0 2.7  HGB 13.7 16.3* 12.7 11.1* 11.3*  HCT 45.0 48.0* 42.3 35.4* 36.7  MCV 72.6*  --  72.7* 70.7* 71.0*  PLT 408*  --  439* 322 353   Basic Metabolic Panel: Recent Labs  Lab 06/15/19 0218 06/15/19 0752 06/15/19 1518 06/16/19 0348 06/17/19 0759  NA 136 136 135 137 134*  K 2.8* 3.4* 3.8 3.7 3.4*  CL 98 100 100 102 103  CO2 _0 21* 21*  GLUCOSE 261* 204* 316* 324* 384*  BUN 6 5* 5* 6 <5*  CREATININE 0.78 0.64 0.85 0.63 0.56  CALCIUM 9.0 8.3* 8.1* 8.0* 7.8*  MG  --   --  1.6* 2.1 1.8  PHOS  --   --   --   --  2.1*   GFR: Estimated Creatinine Clearance: 133.4 mL/min (by C-G formula based on SCr of 0.56 mg/dL). Liver Function Tests: Recent Labs  Lab 06/14/19 1930 06/17/19 0759  AST 53* 37  ALT 81* 51*  ALKPHOS 83 57  BILITOT 0.7 1.1  PROT 8.0 6.4*  ALBUMIN 4.0 2.9*   Recent Labs  Lab 06/14/19 1930  LIPASE 22   No results for input(s): AMMONIA in the last 168 hours. Coagulation Profile: No results for input(s): INR, PROTIME in the last 168 hours. Cardiac Enzymes: No results for input(s): CKTOTAL, CKMB, CKMBINDEX, TROPONINI in the last 168 hours. BNP (last 3 results) No results for input(s): PROBNP in the last 8760 hours. HbA1C: Recent Labs    06/14/19 1922  HGBA1C 9.9*    CBG: Recent Labs  Lab 06/16/19 1649 06/16/19 2104 06/17/19 0034 06/17/19 0825 06/17/19 1116  GLUCAP 263* 251* 250* 361* 297*   Lipid Profile: No results for input(s): CHOL, HDL, LDLCALC, TRIG, CHOLHDL, LDLDIRECT in the last 72 hours. Thyroid Function Tests: Recent Labs    06/17/19 0759  TSH 2.426   Anemia Panel: Recent Labs    06/17/19 0759  VITAMINB12 470  FOLATE  5.9*  FERRITIN 69  TIBC 287  IRON 80  RETICCTPCT 1.5   Sepsis Labs: No results for input(s): PROCALCITON, LATICACIDVEN in the last 168 hours.  Recent Results (from the past 240 hour(s))  SARS CORONAVIRUS 2 (TAT 6-24 HRS) Nasopharyngeal Nasopharyngeal Swab     Status: None   Collection Time: 06/15/19 12:37 AM   Specimen: Nasopharyngeal Swab  Result Value Ref Range Status   SARS Coronavirus 2 NEGATIVE NEGATIVE Final    Comment: (NOTE) SARS-CoV-2 target nucleic acids are NOT DETECTED. The SARS-CoV-2 RNA is generally detectable in upper and lower respiratory specimens during the acute phase of infection. Negative results do not preclude SARS-CoV-2 infection, do not rule out co-infections with other pathogens, and should not be used as the sole basis for treatment or other patient management decisions. Negative results must be combined with clinical observations, patient history, and epidemiological information. The expected result is Negative. Fact Sheet for Patients: SugarRoll.be Fact Sheet for Healthcare Providers: https://www.woods-mathews.com/ This test is not yet approved or cleared by the [place] FDA and  has been authorized for detection and/or diagnosis of SARS-CoV-2 by FDA under an Emergency Use Authorization (EUA). This EUA will remain  in effect (meaning this test can be used) for the duration of the COVID-19 declaration under Section 56 4(b)(1) of the Act, 21 U.S.C. section 360bbb-3(b)(1), unless the authorization is terminated or revoked  sooner. Performed at [facility], [place] [address] [place], [place] [address]     RN Pressure Injury Documentation:     Estimated body mass index is 52.03 kg/m as calculated from the following:   Height as of this encounter: 5' 1" (1.549 m).   Weight as of this encounter: 124.9 kg.  Malnutrition Type:      Malnutrition Characteristics:      Nutrition Interventions:     Radiology Studies: CT ANGIO CHEST PE W OR WO CONTRAST  Result Date: 06/16/2019 CLINICAL DATA:  Shortness of breath and tachycardia EXAM: CT ANGIOGRAPHY CHEST WITH CONTRAST TECHNIQUE: Multidetector CT imaging of the chest was performed using the standard protocol during bolus administration of intravenous contrast. Multiplanar CT image reconstructions and MIPs were obtained to evaluate the vascular anatomy. CONTRAST:  171m OMNIPAQUE IOHEXOL 350 MG/ML SOLN COMPARISON:  Chest radiograph June 15, 2019 FINDINGS: Cardiovascular: There is no demonstrable pulmonary embolus. There is no thoracic aortic aneurysm or dissection. Visualized great vessels appear unremarkable. Note that the right innominate and left common carotid arteries arise as a common trunk, an anatomic variant. There is no evident pericardial effusion or pericardial thickening. Mediastinum/Nodes: Thyroid appears normal. No evident thoracic adenopathy. There is a small hiatal hernia. Lungs/Pleura: Lungs are clear.  No pleural effusions are evident. Upper Abdomen: There is hepatic steatosis. Visualized upper abdominal structures otherwise appear unremarkable. Musculoskeletal: No blastic or lytic bone lesions are evident. No chest wall lesions. Review of the MIP images confirms the above findings. IMPRESSION: 1. No demonstrable pulmonary embolus. No thoracic aortic aneurysm or dissection. 2.  Lungs clear. 3.  No adenopathy. 4.  Small hiatal hernia. 5.  Hepatic steatosis. Electronically Signed   By: WLowella GripIII M.D.   On: 06/16/2019 11:12    Scheduled Meds: . enoxaparin (LOVENOX) injection  40 mg Subcutaneous Q24H  . insulin aspart  0-20 Units Subcutaneous TID WC  . insulin aspart  0-5 Units Subcutaneous QHS  . insulin aspart  3 Units Subcutaneous TID WC  . insulin glargine  28 Units Subcutaneous Q24H  . polyethylene glycol  17 g Oral BID  . potassium chloride      . senna-docusate  1 tablet Oral BID  . sodium bicarbonate  650 mg Oral BID  . sodium chloride flush  3 mL Intravenous Q12H   Continuous Infusions: . sodium chloride 50 mL/hr at 06/17/19 1053  . potassium PHOSPHATE IVPB (in mmol) 20 mmol (06/17/19 1056)    LOS: 2 days   Kerney Elbe, DO [facility] PAGER is on [place]  If 7PM-7AM, please contact night-coverage [email or web address]

## 2019-06-17 NOTE — Progress Notes (Signed)
Arrived at room to do EEG.  Unable to accomplish EEG set up  at this time due to pts cognitive status.  Will attempt EEG in am when we have two techs available to do set up  Patients mother bedside and Neurology notified

## 2019-06-17 NOTE — Care Management (Signed)
Per [facility] # [phone]  Co-pay amount for Lantus or Norvolog insulin for a 30 day supply is a zero co-pay.  No PA required Tier 3  Retail [facility]: [facility] Ref.# [id number]  * Pt. has medicaid.

## 2019-06-18 ENCOUNTER — Inpatient Hospital Stay (HOSPITAL_COMMUNITY): Payer: Medicare Other

## 2019-06-18 DIAGNOSIS — R945 Abnormal results of liver function studies: Secondary | ICD-10-CM

## 2019-06-18 DIAGNOSIS — R000 Tachycardia, unspecified: Secondary | ICD-10-CM

## 2019-06-18 LAB — COMPREHENSIVE METABOLIC PANEL
ALT: 52 U/L — ABNORMAL HIGH (ref 0–44)
AST: 46 U/L — ABNORMAL HIGH (ref 15–41)
Albumin: 3 g/dL — ABNORMAL LOW (ref 3.5–5.0)
Alkaline Phosphatase: 55 U/L (ref 38–126)
Anion gap: 12 (ref 5–15)
BUN: 5 mg/dL — ABNORMAL LOW (ref 6–20)
CO2: 22 mmol/L (ref 22–32)
Calcium: 8.4 mg/dL — ABNORMAL LOW (ref 8.9–10.3)
Chloride: 103 mmol/L (ref 98–111)
Creatinine, Ser: 0.51 mg/dL (ref 0.44–1.00)
GFR calc Af Amer: >60 mL/min (ref >60)
GFR calc non Af Amer: >60 mL/min (ref >60)
Glucose, Bld: 243 mg/dL — ABNORMAL HIGH (ref 70–99)
Potassium: 3.5 mmol/L (ref 3.5–5.1)
Sodium: 137 mmol/L (ref 135–145)
Total Bilirubin: 0.9 mg/dL (ref 0.3–1.2)
Total Protein: 6.2 g/dL — ABNORMAL LOW (ref 6.5–8.1)

## 2019-06-18 LAB — CBC WITH DIFFERENTIAL/PLATELET
Abs Immature Granulocytes: 0.02 10*3/uL (ref 0.00–0.07)
Basophils Absolute: 0 10*3/uL (ref 0.0–0.1)
Basophils Relative: 0 %
Eosinophils Absolute: 0.3 10*3/uL (ref 0.0–0.5)
Eosinophils Relative: 4 %
HCT: 36.7 % (ref 36.0–46.0)
Hemoglobin: 11.4 g/dL — ABNORMAL LOW (ref 12.0–15.0)
Immature Granulocytes: 0 %
Lymphocytes Relative: 47 %
Lymphs Abs: 2.7 10*3/uL (ref 0.7–4.0)
MCH: 22.3 pg — ABNORMAL LOW (ref 26.0–34.0)
MCHC: 31.1 g/dL (ref 30.0–36.0)
MCV: 71.7 fL — ABNORMAL LOW (ref 80.0–100.0)
Monocytes Absolute: 0.6 10*3/uL (ref 0.1–1.0)
Monocytes Relative: 9 %
Neutro Abs: 2.4 10*3/uL (ref 1.7–7.7)
Neutrophils Relative %: 40 %
Platelets: 359 10*3/uL (ref 150–400)
RBC: 5.12 MIL/uL — ABNORMAL HIGH (ref 3.87–5.11)
RDW: 14.3 % (ref 11.5–15.5)
WBC: 5.9 10*3/uL (ref 4.0–10.5)
nRBC: 0 % (ref 0.0–0.2)

## 2019-06-18 LAB — GLUCOSE, CAPILLARY
Glucose-Capillary: 290 mg/dL — ABNORMAL HIGH (ref 70–99)
Glucose-Capillary: 243 mg/dL — ABNORMAL HIGH (ref 70–99)
Glucose-Capillary: 266 mg/dL — ABNORMAL HIGH (ref 70–99)
Glucose-Capillary: 262 mg/dL — ABNORMAL HIGH (ref 70–99)

## 2019-06-18 LAB — MAGNESIUM: Magnesium: 1.9 mg/dL (ref 1.7–2.4)

## 2019-06-18 LAB — PHOSPHORUS: Phosphorus: 2.7 mg/dL (ref 2.5–4.6)

## 2019-06-18 MED ORDER — INSULIN GLARGINE 100 UNIT/ML ~~LOC~~ SOLN
35.0000 [IU] | SUBCUTANEOUS | Status: DC
  Administered 2019-06-18: 35 [IU] via SUBCUTANEOUS
  Filled 2019-06-18 (×2): qty 0.35

## 2019-06-18 MED ORDER — INSULIN ASPART 100 UNIT/ML ~~LOC~~ SOLN
6.0000 [IU] | Freq: Three times a day (TID) | SUBCUTANEOUS | Status: DC
  Administered 2019-06-18 – 2019-06-19 (×5): 6 [IU] via SUBCUTANEOUS

## 2019-06-18 MED ORDER — POTASSIUM CHLORIDE CRYS ER 20 MEQ PO TBCR
40.0000 meq | EXTENDED_RELEASE_TABLET | Freq: Once | ORAL | Status: AC
  Administered 2019-06-18: 40 meq via ORAL
  Filled 2019-06-18: qty 2

## 2019-06-18 MED ORDER — METOPROLOL TARTRATE 25 MG PO TABS
25.0000 mg | ORAL_TABLET | Freq: Two times a day (BID) | ORAL | Status: DC
  Administered 2019-06-18 – 2019-06-19 (×3): 25 mg via ORAL
  Filled 2019-06-18 (×3): qty 1

## 2019-06-18 NOTE — Progress Notes (Signed)
LTM started; no initial skin breakdown was seen. Educated mom and nurse on event button. [facility] monitoring.

## 2019-06-18 NOTE — Care Management Important Message (Signed)
Important Message  Patient Details  Name: Amanda Snyder MRN: [id number] Date of Birth: 1995-12-17   Medicare Important Message Given:  Yes  IM given and signed   Orbie Pyo 06/18/2019, 3:01 PM

## 2019-06-18 NOTE — Progress Notes (Signed)
PROGRESS NOTE    Amanda Snyder  NOI:[id number] DOB: 1996-01-22 DOA: 06/14/2019 PCP: Leone Haven, MD  Brief Narrative:  HPI per Dr. Zada Finders on 06/15/19 Amanda Snyder is a 24 y.o. female with medical history significant for severe intellectual disability and type 2 diabetes who presents to the ED from urgent care for evaluation of hyperglycemia and nausea, vomiting, abdominal pain.  Patient unable to provide history therefore entire history is obtained from mother at bedside, EDP, and chart review.  Mother reports patient has been having about 1 week of frequent urination.  She has recently been complaining of abdominal pain.  She has had nausea with one episode of scant emesis.  She had a telemedicine visit with her PCP who recommended she present to the urgent care for an in person abdominal examination.  In urgent care she was noted to be hyperglycemic with blood sugar >600.  Urine dip showed negative nitrates, negative leukocytes, > 1000 glucose, negative ketones.  Patient was sent to the ED for further evaluation management.  ED Course:  Initial vitals showed BP 150/98, pulse 111, RR 20, SPO2 100% on room air.  Labs show serum glucose 550, sodium 132, potassium 4.1, bicarb 21, anion gap 16, BUN 9, creatinine 0.86, AST 53, ALT 81, alk phos 83, total bilirubin 0.7, lipase 22, beta hydroxy butyrate 0.7.  VBG shows pH 7.413, PCO2 37.1, PO2 64.  Urinalysis is collected and pending.  SARS-CoV-2 PCR is ordered and pending.  Patient was given 2 L LR, 1 L normal saline, and started on insulin infusion.  The hospitalist service was consulted to admit for further evaluation and management.  Review of Systems:  Unable to obtain full review of systems due to patient's mental status  **Interim History  Blood sugar still remains elevated but is improving with adjustments and will further adjust given her hyperglycemia.  Still has some sinus tachycardia but this is slowly improving.   No chest pain, nausea or vomiting patient does not appear to be in any distress.  Mother had a lot of questions about her diabetes diagnosis which are currently being answered.  Mother needs diabetic teaching and will continue to further educate and have the diabetes coordinator work with the patient's mother for that she is comfortable not insulin administration.  Will make sure that she is not having absence seizure's and will obtain an EEG prior to discharge  Assessment & Plan:   Principal Problem:   Severe hyperglycemia due to diabetes mellitus ([place]) Active Problems:   Severe intellectual disabilities  DKA/Hyperglycemia in setting of New onset Type 2 Diabetes; DKA is resolved however she still has some Hyperglycemia Metabolic Acidosis  -Home diabetes regimen includes Metformin and Januvia, not on insulin therapy but now likely will be given her elevated hemoglobin A1c -Initial CBG >600, bicarb 21, anion gap 16, VBG pH 7.413, beta hydroxybutyrate 0.7. -His death a mild metabolic acidosis with a CO2 21, chloride level 102, and anion gap 14 -Outside urine dipstick was negative for ketones or evidence of UTI. -Continued Insulin infusion per Hyperglycemia protocol and transitioned off and now is on long-acting insulin and was placed on 10 units this is now been increased steadily throughout the last few days and she is now on 5 units subcu q. 24 -Continued with moderate NovoLog/scale insulin AC and at bedtime because of her uncontrolled hyperglycemia in elevated blood sugars will increase the scale to resistant NovoLog sliding scale before meals and at bedtime -Increase premeal insulin from 3  units subcu 3 times daily with meals to 6 units -Now on a Carb Modified Diet and have asked dietitian to further educate mother about the patient's diet -IV fluid hydration is now stopped -C/w D50W prn Low Blood Sugar  -Unclear why she was so hyperglycemic but leukocytosis has resolved, urinalysis was  unrevealing for infection, and CT of the Abd/Pelvis showed "No acute CT finding to account for the patient's symptoms. Marked fatty infiltration of the liver with mild hepatomegaly and two foci of probable focal fatty sparing at the gallbladder fossa. Small hiatal hernia.  Mild diverticulosis coli and moderate stool burden without evidence of acute diverticulitis or bowel obstruction." -Check CTA chest to rule out PE and she did not have a PE and showed no demonstratable pulmonary embolus and no thoracic aortic aneurysm or dissection the lungs been clear no adenopathy and she had a small hiatal hernia and hepatic steatosis -Continue Holding Metformin and Januvia -Checked Hemoglobin A1c and was 9.9 -CBGs now ranging from 243-337 -Consulted diabetes education coordinator for further assistance in management and diabetic teaching and diabetes education coronary is in agreement with the adjustments today  Sinus Tachycardia, improving slowly -Unknown etiology, could be related to dehydration vs ?anxiety/?pain/frequent movement -Saturating well on room air -EKG with sinus tachy, with prolonged QTC -Chest x-ray unremarkable -D-dimer mildly elevated at 0.65 -Hydrated for another 24 hours but rate has been reduced to 50 MLS per hour for another 10 hours -Because she said persistent sinus tachycardia will obtain a CTA of the chest to rule out PE and it showed "No demonstrable pulmonary embolus. No thoracic aortic aneurysm or dissection. Lungs clear. No adenopathy. Small hiatal hernia. Hepatic steatosis." -Checked TSH and was 2.426 -Continue with Telemetry Monitoring for now and it appears that her tachycardia is improving but may need a low-dose beta-blocker and will start metoprolol 25 minutes p.o. twice daily -She is asymptomatic currently   Leukocytosis -Likely reactive and improved. WBC went from 11.0 -> 13.2 -> 7.4 -> 5.5 -> 5.9 -Continue to Monitor for S/Sx of Infection -Repeat CBC in AM  -CTA  as above  Nausea/Abdominal Pain -Likely related to hyperglycemia and constipation.   -Fatty liver and Hepatic Steatosis noted on CT which is chronic per patient's mother. -Antiemetics as needed -Start bowel regimen for Constipation  -CT Abdomen and Pelvis showed No acute CT finding to account for the patient's symptoms. Marked fatty infiltration of the liver with mild hepatomegaly and two foci of probable focal fatty sparing at the gallbladder fossa. Small hiatal hernia.  Mild diverticulosis coli and moderate stool burden without evidence of acute diverticulitis or bowel obstruction.  Hepatic Steatosis/Fatty Liver Infiltration  Abnormal LFTs -Given the patient's mother weight loss and dietary counseling -LFTs are likely elevated in the setting of her fatty infiltration and hepatic steatosis -Continue monitor and trend and repeat CMP in a.m. -CT Scan of the abdomen pelvis as above and showed fatty infiltration of the liver with mild hepatomegaly with 2 foci possible focal fatty sparing at the gallbladder fossa repeat CT of the chest showed hepatic steatosis -AST went from 37 -> 46 and ALT went from 51 -> 52 but it is improved since Admission as AST was 53 and ALT is now 57 -Will Consider ultrasound of the gallbladder and liver if worsening  -Continue monitor and trend hepatic function panel and repeat CMP in a.m.  Severe Intellectual Disability -Chronic and stable.   -Patient ambulates on her own per mother.  Super Morbid Obesity -Estimated body  mass index is 52.03 kg/m as calculated from the following:   Height as of this encounter: 5' 1"  (1.549 m).   Weight as of this encounter: 124.9 kg. -Weight Loss and Dietary Counseling given to Mother as Patient has Severe Intellectual Disability   Microcytic Anemia -Patient's hemoglobin/hematocrit went from 16.3/40.0 on admission is that she is likely hemoconcentrated and dehydrated and is now 11.4/36.7 after fluid resuscitation -Checked  anemia panel and showed an iron level of 80, U IBC of 207, TIBC of 287, saturation ratios of 20%, ferritin level 69, folate level 5.9, and vitamin B12 level 470 -Continue to monitor for signs and symptoms of bleeding; currently no overt bleeding noted -Make obtain a FOBT if continues to drop -Repeat CBC in a.m.  Hypophosphatemia -Patient's phosphorus level this morning was 2.7 -Continue to monitor and replete as necessary -Repeat phosphorus in a.m.  Hypokalemia -Mild as the patient's potassium was 3.5 -Replete with po KCl 40 mEQ x1 -Continue to monitor replete as necessary -Repeat CMP in a.m.  Hyponatremia -Mild and now improved. Sodium is now 137 -IVF now stopped  Continue to monitor and trend -Repeat CMP in a.m.  Metabolic Acidosis -Mild -Patient CO2 was 21, chloride was 103 and anion gap was 10; CO2 is 22, anion gap is 12, and 4 level was 103 -Patient was given sodium bicarbonate tablets 650 mg p.o. twice daily x2 doses  -Continue to monitor and repeat CMP in a.m.  Questionable Absence Seizure -Patient's mother describes the patient staring off into space but is attributing this to her hyperglycemia -Patient's mother states that she does have a history of seizures but they are usually grand mal and she has not had them since the patient was a kid -Patient's mother states that she improved when she received the insulin -We will need to continue to monitor closely and will order LTM EEG with Video and I discussed this with Dr. Hortense Ramal -LTM EEG with video still pending to be done and patient's mother states patient had 2 staring spells since yesterday  DVT prophylaxis: Enoxaparin 40 mg sq q24h Code Status: FULL CODE Family Communication: Discussed in detail with patient's Mother at bedside  Disposition Plan: Patient from Home and will obtain PT/OT to evaluate and treat prior to D/C. Will need to ensure patient's HR is improved, Blood Sugars are stable, Mother has adequate Diabetic  Teaching prior to D/C and that the patient is not having seizures. Anticipate D/C in the next 24-48 hours   Consultants:   None   Procedures: CTA of Chest LTM EEG    Antimicrobials:  Anti-infectives (From admission, onward)   None     Subjective: Seen and examined at bedside and the patient herself is unable to provide a subjective history due to her current condition but mother states that she is doing better today.  Had 2 episodes of staring spells since yesterday.  No nausea or vomiting.  Mother thinks that she is improving.  No other concerns or complaints at this time.  Objective: Vitals:   06/18/19 0000 06/18/19 0037 06/18/19 0041 06/18/19 0409  BP:  (!) 144/104  (!) 126/91  Pulse: (!) 103 (!) 104 (!) 103 (!) 104  Resp: (!) 22 (!) 32 (!) 29 10  Temp:  98.9 F (37.2 C)  98.6 F (37 C)  TempSrc:  Oral  Oral  SpO2: 96% 96% 96% 97%  Weight:      Height:       No intake or output data in  the 24 hours ending 06/18/19 1358 Filed Weights   06/15/19 2028  Weight: 124.9 kg   Examination: Physical Exam:  Constitutional: WN/WD obese obese African-American female who is unable to provide a subjective history due to her severe intellectual disability but she appears in no acute distress Eyes: Lids and conjunctivae normal, sclerae anicteric  ENMT: External Ears, Nose appear normal. Grossly normal hearing. Mucous membranes are moist.  Neck: Appears normal, supple, no cervical masses, normal ROM, no appreciable thyromegaly; no JVD Respiratory: Mildly diminished to auscultation bilaterally, no wheezing, rales, rhonchi or crackles.  Slightly increased respiratory effort. No accessory muscle use.  Unlabored breathing Cardiovascular: Tachycardic rate but regular rhythm.  S1 and S2 auscultated.  No appreciable extremity edema noted Abdomen: Soft, non-tender, distended secondary body habitus. Bowel sounds positive.  GU: Deferred. Musculoskeletal: No clubbing / cyanosis of  digits/nails. No joint deformity upper and lower extremities. Skin: No rashes, lesions, ulcers. No induration; Warm and dry.  Neurologic: CN 2-12 grossly intact with no focal deficits. Romberg sign and cerebellar reflexes not assessed.  Psychiatric: Impaired judgment and insight.  She is awake and alert but not and oriented x 3.  Appears calm.  Data Reviewed: I have personally reviewed following labs and imaging studies  CBC: Recent Labs  Lab 06/14/19 1922 06/14/19 1922 06/14/19 1941 06/15/19 0218 06/16/19 0348 06/17/19 0759 06/18/19 0328  WBC 11.0*  --   --  13.2* 7.4 5.5 5.9  NEUTROABS  --   --   --   --  3.0 2.7 2.4  HGB 13.7   < > 16.3* 12.7 11.1* 11.3* 11.4*  HCT 45.0   < > 48.0* 42.3 35.4* 36.7 36.7  MCV 72.6*  --   --  72.7* 70.7* 71.0* 71.7*  PLT 408*  --   --  439* 322 325 359   < > = values in this interval not displayed.   Basic Metabolic Panel: Recent Labs  Lab 06/15/19 0752 06/15/19 1518 06/16/19 0348 06/17/19 0759 06/18/19 0328  NA 136 135 137 134* 137  K 3.4* 3.8 3.7 3.4* 3.5  CL 100 100 102 103 103  CO2 23 22 21* 21* 22  GLUCOSE 204* 316* 324* 384* 243*  BUN 5* 5* 6 <5* 5*  CREATININE 0.64 0.85 0.63 0.56 0.51  CALCIUM 8.3* 8.1* 8.0* 7.8* 8.4*  MG  --  1.6* 2.1 1.8 1.9  PHOS  --   --   --  2.1* 2.7   GFR: Estimated Creatinine Clearance: 133.4 mL/min (by C-G formula based on SCr of 0.51 mg/dL). Liver Function Tests: Recent Labs  Lab 06/14/19 1930 06/17/19 0759 06/18/19 0328  AST 53* 37 46*  ALT 81* 51* 52*  ALKPHOS 83 57 55  BILITOT 0.7 1.1 0.9  PROT 8.0 6.4* 6.2*  ALBUMIN 4.0 2.9* 3.0*   Recent Labs  Lab 06/14/19 1930  LIPASE 22   No results for input(s): AMMONIA in the last 168 hours. Coagulation Profile: No results for input(s): INR, PROTIME in the last 168 hours. Cardiac Enzymes: No results for input(s): CKTOTAL, CKMB, CKMBINDEX, TROPONINI in the last 168 hours. BNP (last 3 results) No results for input(s): PROBNP in the last  8760 hours. HbA1C: No results for input(s): HGBA1C in the last 72 hours. CBG: Recent Labs  Lab 06/17/19 1116 06/17/19 1624 06/17/19 1951 06/18/19 0749 06/18/19 1204  GLUCAP 297* 292* 337* 290* 243*   Lipid Profile: No results for input(s): CHOL, HDL, LDLCALC, TRIG, CHOLHDL, LDLDIRECT in the last 72 hours.  Thyroid Function Tests: Recent Labs    06/17/19 0759  TSH 2.426   Anemia Panel: Recent Labs    06/17/19 0759  VITAMINB12 470  FOLATE 5.9*  FERRITIN 69  TIBC 287  IRON 80  RETICCTPCT 1.5   Sepsis Labs: No results for input(s): PROCALCITON, LATICACIDVEN in the last 168 hours.  Recent Results (from the past 240 hour(s))  SARS CORONAVIRUS 2 (TAT 6-24 HRS) Nasopharyngeal Nasopharyngeal Swab     Status: None   Collection Time: 06/15/19 12:37 AM   Specimen: Nasopharyngeal Swab  Result Value Ref Range Status   SARS Coronavirus 2 NEGATIVE NEGATIVE Final    Comment: (NOTE) SARS-CoV-2 target nucleic acids are NOT DETECTED. The SARS-CoV-2 RNA is generally detectable in upper and lower respiratory specimens during the acute phase of infection. Negative results do not preclude SARS-CoV-2 infection, do not rule out co-infections with other pathogens, and should not be used as the sole basis for treatment or other patient management decisions. Negative results must be combined with clinical observations, patient history, and epidemiological information. The expected result is Negative. Fact Sheet for Patients: SugarRoll.be Fact Sheet for Healthcare Providers: https://www.woods-mathews.com/ This test is not yet approved or cleared by the [place] FDA and  has been authorized for detection and/or diagnosis of SARS-CoV-2 by FDA under an Emergency Use Authorization (EUA). This EUA will remain  in effect (meaning this test can be used) for the duration of the COVID-19 declaration under Section 56 4(b)(1) of the Act, 21  U.S.C. section 360bbb-3(b)(1), unless the authorization is terminated or revoked sooner. Performed at [facility], [place] [address], [place], [place] [address]     RN Pressure Injury Documentation:     Estimated body mass index is 52.03 kg/m as calculated from the following:   Height as of this encounter: 5' 1"  (1.549 m).   Weight as of this encounter: 124.9 kg.  Malnutrition Type:      Malnutrition Characteristics:      Nutrition Interventions:     Radiology Studies: No results found. Scheduled Meds: . enoxaparin (LOVENOX) injection  40 mg Subcutaneous Q24H  . insulin aspart  0-20 Units Subcutaneous TID WC  . insulin aspart  0-5 Units Subcutaneous QHS  . insulin aspart  6 Units Subcutaneous TID WC  . insulin glargine  35 Units Subcutaneous Q24H  . polyethylene glycol  17 g Oral BID  . senna-docusate  1 tablet Oral BID  . sodium chloride flush  3 mL Intravenous Q12H   Continuous Infusions:   LOS: 3 days   Kerney Elbe, DO [facility] PAGER is on AMION  If 7PM-7AM, please contact night-coverage [email or web address]

## 2019-06-18 NOTE — Progress Notes (Signed)
Pt BP 144/104, RR ranges from 10 to 40, pt not in distress, SpO2 WDL on RA. When pt is awake RR increases, no use of accessory muscles noted, lungs clear on auscultation.  [place], NP notified.  No new orders at this time

## 2019-06-19 DIAGNOSIS — R404 Transient alteration of awareness: Secondary | ICD-10-CM

## 2019-06-19 LAB — COMPREHENSIVE METABOLIC PANEL
ALT: 66 U/L — ABNORMAL HIGH (ref 0–44)
AST: 59 U/L — ABNORMAL HIGH (ref 15–41)
Albumin: 3 g/dL — ABNORMAL LOW (ref 3.5–5.0)
Alkaline Phosphatase: 57 U/L (ref 38–126)
Anion gap: 9 (ref 5–15)
BUN: 5 mg/dL — ABNORMAL LOW (ref 6–20)
CO2: 23 mmol/L (ref 22–32)
Calcium: 8.8 mg/dL — ABNORMAL LOW (ref 8.9–10.3)
Chloride: 104 mmol/L (ref 98–111)
Creatinine, Ser: 0.54 mg/dL (ref 0.44–1.00)
GFR calc Af Amer: >60 mL/min (ref >60)
GFR calc non Af Amer: >60 mL/min (ref >60)
Glucose, Bld: 226 mg/dL — ABNORMAL HIGH (ref 70–99)
Potassium: 3.8 mmol/L (ref 3.5–5.1)
Sodium: 136 mmol/L (ref 135–145)
Total Bilirubin: 0.5 mg/dL (ref 0.3–1.2)
Total Protein: 6.5 g/dL (ref 6.5–8.1)

## 2019-06-19 LAB — CBC WITH DIFFERENTIAL/PLATELET
Abs Immature Granulocytes: 0.02 10*3/uL (ref 0.00–0.07)
Basophils Absolute: 0 10*3/uL (ref 0.0–0.1)
Basophils Relative: 0 %
Eosinophils Absolute: 0.3 10*3/uL (ref 0.0–0.5)
Eosinophils Relative: 5 %
HCT: 38.8 % (ref 36.0–46.0)
Hemoglobin: 12 g/dL (ref 12.0–15.0)
Immature Granulocytes: 0 %
Lymphocytes Relative: 46 %
Lymphs Abs: 3.3 10*3/uL (ref 0.7–4.0)
MCH: 22.2 pg — ABNORMAL LOW (ref 26.0–34.0)
MCHC: 30.9 g/dL (ref 30.0–36.0)
MCV: 71.7 fL — ABNORMAL LOW (ref 80.0–100.0)
Monocytes Absolute: 0.7 10*3/uL (ref 0.1–1.0)
Monocytes Relative: 10 %
Neutro Abs: 2.8 10*3/uL (ref 1.7–7.7)
Neutrophils Relative %: 39 %
Platelets: 397 10*3/uL (ref 150–400)
RBC: 5.41 MIL/uL — ABNORMAL HIGH (ref 3.87–5.11)
RDW: 14.7 % (ref 11.5–15.5)
WBC: 7.1 10*3/uL (ref 4.0–10.5)
nRBC: 0 % (ref 0.0–0.2)

## 2019-06-19 LAB — GLUCOSE, CAPILLARY
Glucose-Capillary: 239 mg/dL — ABNORMAL HIGH (ref 70–99)
Glucose-Capillary: 331 mg/dL — ABNORMAL HIGH (ref 70–99)

## 2019-06-19 LAB — MAGNESIUM: Magnesium: 1.9 mg/dL (ref 1.7–2.4)

## 2019-06-19 LAB — PHOSPHORUS: Phosphorus: 3 mg/dL (ref 2.5–4.6)

## 2019-06-19 MED ORDER — NOVOLOG FLEXPEN 100 UNIT/ML ~~LOC~~ SOPN: 0.0000 [IU] | PEN_INJECTOR | Freq: Three times a day (TID) | SUBCUTANEOUS | 11 refills | Status: DC

## 2019-06-19 MED ORDER — INSULIN GLARGINE 100 UNITS/ML SOLOSTAR PEN: 40.0000 [IU] | PEN_INJECTOR | Freq: Every day | SUBCUTANEOUS | 11 refills | Status: DC

## 2019-06-19 MED ORDER — BLOOD GLUCOSE MONITOR KIT: PACK | 0 refills | Status: DC

## 2019-06-19 MED ORDER — SENNOSIDES-DOCUSATE SODIUM 8.6-50 MG PO TABS: 1.0000 | ORAL_TABLET | Freq: Every day | ORAL | 0 refills | Status: DC

## 2019-06-19 MED ORDER — METOPROLOL TARTRATE 50 MG PO TABS: 50.0000 mg | ORAL_TABLET | Freq: Two times a day (BID) | ORAL | Status: DC

## 2019-06-19 MED ORDER — METOPROLOL TARTRATE 50 MG PO TABS: 50.0000 mg | ORAL_TABLET | Freq: Two times a day (BID) | ORAL | 0 refills | Status: DC

## 2019-06-19 MED ORDER — ONDANSETRON HCL 4 MG PO TABS: 4.0000 mg | ORAL_TABLET | Freq: Four times a day (QID) | ORAL | 0 refills | Status: DC | PRN

## 2019-06-19 MED ORDER — POLYETHYLENE GLYCOL 3350 17 G PO PACK: 17.0000 g | PACK | Freq: Every day | ORAL | 0 refills | Status: DC | PRN

## 2019-06-19 MED ORDER — INSULIN GLARGINE 100 UNIT/ML ~~LOC~~ SOLN
40.0000 [IU] | SUBCUTANEOUS | Status: DC
  Administered 2019-06-19: 40 [IU] via SUBCUTANEOUS
  Filled 2019-06-19 (×2): qty 0.4

## 2019-06-19 MED ORDER — INSULIN PEN NEEDLE 32G X 4 MM MISC: 1.0000 | Freq: Three times a day (TID) | 0 refills | Status: DC

## 2019-06-19 NOTE — TOC Transition Note (Signed)
Transition of Care [facility]) - CM/SW Discharge Note   Patient Details  Name: Amanda Snyder MRN: [id number] Date of Birth: 31-Jul-1995  [facility] of [facility]) CM/SW Contact:  Carles Collet, RN Phone Number: 06/19/2019, 12:14 PM   Clinical Narrative:   Damaris Schooner w patient's mother, she is agreeable to go home w a [facility] RN for education and reinforcement with new DM diagnosis. Per benefit checks, DCing meds appear to be covered.  No other CM needs identified          Patient Goals and CMS Choice        Discharge Placement                       Discharge Plan and Services                                     Social Determinants of Health (SDOH) Interventions     Readmission Risk Interventions No flowsheet data found.

## 2019-06-19 NOTE — Procedures (Addendum)
Patient Name: Amanda Snyder  MRN: [id number]  Epilepsy Attending: Lora Havens  Referring Physician/Provider: Dr Baird Kay Duration: 06/18/2019 1608 to 06/19/2019 4854 Total study duration: 16 hours  Patient history: 24yo Intellectually disabled female with episodes of staring. EEG to evaluate for seizure.   Level of alertness: awake, asleep  AEDs during EEG study: None  Technical aspects: This EEG study was done with scalp electrodes positioned according to the 10-20 International system of electrode placement. Electrical activity was acquired at a sampling rate of 500Hz  and reviewed with a high frequency filter of 70Hz  and a low frequency filter of 1Hz . EEG data were recorded continuously and digitally stored.   DESCRIPTION:  The posterior dominant rhythm consists of 9-10 Hz activity of moderate voltage (25-35 uV) seen predominantly in posterior head regions, symmetric and reactive to eye opening and eye closing. Sleep was characterized by vertex waves, sleep spindles (12-14hz ), maximal frontocentral, REM sleep. Hyperventilation and photic stimulation were not performed.  Event button was pressed on 06/18/2019 at 1657 and 06/19/2019 at 0707 for unclear reasons. Concomitant eeg before, during and after the event didn't show any eeg change to suggest seizure.    IMPRESSION: This study is within normal limits. No seizures or epileptiform discharges were seen throughout the recording.  Event button was pressed twice as described above without eeg change and were likely non epileptic.   Kailene Steinhart Barbra Sarks

## 2019-06-19 NOTE — Discharge Summary (Signed)
Physician Discharge Summary  Amanda Snyder TDD:[id number] DOB: 1995-05-17 DOA: 06/14/2019  PCP: Leone Haven, MD  Admit date: 06/14/2019 Discharge date: 06/19/2019  Admitted From: Home Disposition: Home with Home Health RN  Recommendations for Outpatient Follow-up:  1. Follow up with PCP in 1-2 weeks 2. Follow-up with diabetes education coordinator in the outpatient setting 3. If continues to still have sinus tachycardia despite being on beta-blocker recommend outpatient cardiology evaluation 4. Please obtain CMP/CBC, Mag, Phos in one week 5. Please follow up on the following pending results:  Home Health: Yes Equipment/Devices: None   Discharge Condition: Stable CODE STATUS: FULL CODE Diet recommendation: Heart Healthy Carb Modified Diet  Brief/Interim Summary: HPI per Dr. Zada Finders on 06/15/19 Amanda Jenningsis a 24 y.o.femalewith medical history significant forsevere intellectual disability and type 2 diabetes who presents to the ED from urgent care for evaluation of hyperglycemia and nausea, vomiting, abdominal pain.Patient unable to provide history therefore entire history is obtained from mother at bedside, EDP, and chart review.  Mother reports patient has been having about 1 week of frequent urination. She has recently been complaining of abdominal pain. She has had nausea with one episode of scant emesis. She had a telemedicine visit with her PCP who recommended she present to the urgent care for an in person abdominal examination. In urgent care she was noted to be hyperglycemic with blood sugar >600. Urine dip showed negative nitrates, negative leukocytes, >1000 glucose, negative ketones. Patient was sent to the ED for further evaluation management.  ED Course: Initial vitals showed BP 150/98, pulse 111, RR 20, SPO2 100% on room air.  Labs show serum glucose 550, sodium 132, potassium 4.1, bicarb 21, anion gap 16, BUN 9, creatinine 0.86, AST 53, ALT  81, alk phos 83, total bilirubin 0.7, lipase 22, beta hydroxy butyrate 0.7. VBG shows pH 7.413, PCO2 37.1, PO2 64.  Urinalysis is collected and pending. SARS-CoV-2 PCR is ordered and pending.  Patient was given 2 L LR, 1 L normal saline, and started on insulin infusion. The hospitalist service was consulted toadmit for further evaluation and management.  Review of Systems: Unable to obtain full review of systems due to patient's mental status  **Interim History  Blood sugar still remains elevated but is improving with adjustments and will further adjust given her hyperglycemia.  Still has some sinus tachycardia but this is slowly improving.  No chest pain, nausea or vomiting patient does not appear to be in any distress.  Mother had a lot of questions about her diabetes diagnosis which are currently being answered.  Mother needs diabetic teaching and will continue to further educate and have the diabetes coordinator work with the patient's mother for that she is comfortable not insulin administration.    There is questionable staring spells the patient was having so we ensure that she did not have absence seizure's and had a long-term EEG monitoring which showed no epileptic activity.  Blood sugar regimen was further adjusted and she is stable for discharge.  She was started on beta-blocker due to her heart rate and heart rates have improved.  She will need to follow-up with PCP as well as diabetes education coronary.  If she continues to have some sinus tachycardia despite being on beta-blocker she will need outpatient cardiology evaluation.  Discharge Diagnoses:  Principal Problem:   Severe hyperglycemia due to diabetes mellitus ([place]) Active Problems:   Severe intellectual disabilities  DKA/Hyperglycemia in setting of New onset Type 2 Diabetes; DKA is resolved however  she still has some Hyperglycemia Metabolic Acidosis  -Home diabetes regimen includes Metformin and Januvia,not on  insulin therapy but now likely will be given her elevated hemoglobin A1c -Initial CBG >600,bicarb 21, anion gap 16, VBG pH 7.413, beta hydroxybutyrate 0.7. -His death a mild metabolic acidosis with a CO2 21, chloride level 102, and anion gap 14 -Outside urine dipstick was negative for ketones or evidence of UTI. -Continued Insulin infusion per Hyperglycemia protocol and transitioned off and now is on long-acting insulin and titrated up to 40 units and can be further up in the outpatient setting by PCP -Will discharge on Lantus FlexPen 40 units along with NovoLog sensitive sliding scale 0 to 9 units 3 times daily with meals and will also continue Metformin 850 mg p.o. twice daily at discharge -Continued with moderate NovoLog/scale insulin AC and at bedtime because of her uncontrolled hyperglycemia in elevated blood sugars will increase the scale to resistant NovoLog sliding scale before meals and at bedtime -Increase premeal insulin from 3 units subcu 3 times daily with meals to 6 units when she was hospitalized -Now on a Carb Modified Diet and have asked dietitian to further educate mother about the patient's diet -IV fluid hydration is now stopped -C/w D50W prn Low Blood Sugar  -Unclear why she was so hyperglycemic but leukocytosis has resolved, urinalysis was unrevealing for infection, and CT of the Abd/Pelvis showed "No acute CT finding to account for the patient's symptoms. Marked fatty infiltration of the liver with mild hepatomegaly and two foci of probable focal fatty sparing at the gallbladder fossa. Small hiatal hernia.  Mild diverticulosis coli and moderate stool burden without evidence of acute diverticulitis or bowel obstruction." -Check CTA chest to rule out PE and she did not have a PE and showed no demonstratable pulmonary embolus and no thoracic aortic aneurysm or dissection the lungs been clear no adenopathy and she had a small hiatal hernia and hepatic steatosis -Continue Metformin  at discharge and will hold Januvia -Checked Hemoglobin A1c and was 9.9 -CBGs now ranging from  239-331 -Consulted diabetes education coordinator for further assistance in management and diabetic teaching and diabetes education coronary is in agreement with the adjustments today -Patient stable to be discharged at this time as her blood sugars are improving and can be further titrated in outpatient setting next-she will need to follow-up with PCP and diabetes education coronary within 1 week  Sinus Tachycardia, improving slowly -Unknown etiology, could be related to dehydrationvs ?anxiety/?pain/frequent movement -Saturating well on room air -EKG with sinus tachy,with prolonged QTC -Chest x-ray unremarkable -D-dimer mildly elevated at 0.65 -Hydrated for another 24 hours but rate has been reduced to 50 MLS per hour for another 10 hours -Because she said persistent sinus tachycardia will obtain a CTA of the chest to rule out PE and it showed "No demonstrable pulmonary embolus. No thoracic aortic aneurysm or dissection. Lungs clear. No adenopathy. Small hiatal hernia.Hepatic steatosis." -Checked TSH and was 2.426 -Continue with Telemetry Monitoring for now and it appears that her tachycardia is improving but may need a low-dose beta-blocker -Started on low-dose beta-blocker metoprolol 25 mg p.o. twice daily and increase to 50 mg p.o. twice daily and will be discharged on this -She is asymptomatic currently   Leukocytosis -Likely reactive and improved. WBC went from 11.0 -> 13.2 -> 7.4 -> 5.5 -> 5.9 -> 7.1 -Continue to Monitor for S/Sx of Infection -Repeat CBC in AM  -CTA as above  Nausea/Abdominal Pain -Likely related to hyperglycemia and constipation.  -  Fatty liver and Hepatic Steatosis noted on CT which is chronic per patient's mother. -Antiemetics as needed -Start bowel regimen for Constipation  -CT Abdomen and Pelvis showed No acute CT finding to account for the patient's  symptoms. Marked fatty infiltration of the liver with mild hepatomegaly and two foci of probable focal fatty sparing at the gallbladder fossa. Small hiatal hernia.  Mild diverticulosis coli and moderate stool burden without evidence of acute diverticulitis or bowel obstruction.  Hepatic Steatosis/Fatty Liver Infiltration  Abnormal LFTs -Given the patient's mother weight loss and dietary counseling -LFTs are likely elevated in the setting of her fatty infiltration and hepatic steatosis -Continue monitor and trend and repeat CMP in a.m. -CT Scan of the abdomen pelvis as above and showed fatty infiltration of the liver with mild hepatomegaly with 2 foci possible focal fatty sparing at the gallbladder fossa repeat CT of the chest showed hepatic steatosis -AST went from 37 -> 46 -> 59and ALT went from 51 -> 52 -> 66 but it is improved since Admission as AST was 53 and ALT is now 40 -Patient has absolutely no abdominal pain on palpation of her right upper quadrant -Will Consider ultrasound of the gallbladder and liver if worsening but appears relatively stable there actually improved from admission and can be checked in the outpatient setting and have a ultrasound in the outpatient setting; likely this is elevated in the setting of her hepatic steatosis -Continue monitor and trend hepatic function panel and repeat CMP within 1 week  Severe Intellectual Disability -Chronic and stable.  -Patient ambulates on her own per mother.  Super Morbid Obesity -Estimated body mass index is 52.03 kg/m as calculated from the following:   Height as of this encounter: 5' 1"  (1.549 m).   Weight as of this encounter: 124.9 kg. -Weight Loss and Dietary Counseling given to Mother as Patient has Severe Intellectual Disability   Microcytic Anemia -Patient's hemoglobin/hematocrit went from 16.3/40.0 on admission is that she is likely hemoconcentrated and dehydrated and is now 12.0/38.8 after fluid  resuscitation -Checked anemia panel and showed an iron level of 80, U IBC of 207, TIBC of 287, saturation ratios of 20%, ferritin level 69, folate level 5.9, and vitamin B12 level 470 -Continue to monitor for signs and symptoms of bleeding; currently no overt bleeding noted -Make obtain a FOBT if continues to drop -Repeat CBC within 1 week  Hypophosphatemia -Patient's phosphorus level this morning was 3.0 -Continue to monitor and replete as necessary -Repeat phosphorus within normal  Hypokalemia -Mild as the patient's potassium was 3.8 -Replete with po KCl 40 mEQ x1 yesterday -Continue to monitor replete as necessary -Repeat CMP within 1 week  Hyponatremia -Mild and now improved. Sodium is now 136 -IVF now stopped  Continue to monitor and trend -Repeat CMP within 1 week  Metabolic Acidosis -Mild -Patient CO2 was 21, chloride was 103 and anion gap was 10; CO2 is 23, anion gap is 9, and chloride level is 104 -Patient was given sodium bicarbonate tablets 650 mg p.o. twice daily x2 doses  -Continue to monitor and repeat CMP within 1 week  Questionable Absence Seizure -Patient's mother describes the patient staring off into space but is attributing this to her hyperglycemia -Patient's mother states that she does have a history of seizures but they are usually grand mal and she has not had them since the patient was a kid -Patient's mother states that she improved when she received the insulin -We will need to continue to monitor  closely and will order LTM EEG with Video and I discussed this with Dr. Hortense Ramal -LTM EEG with video done and showed no epileptic form of activities despite the patient's mother pushed the button twice -If necessary can follow-up with neurology in the outpatient setting  Discharge Instructions  Discharge Instructions    Ambulatory referral to Nutrition and Diabetic Education   Complete by: As directed    New diagnosis and new to insulin. Pt is mentally  challenged and Mother will be the one to come to appt.   Call MD for:  difficulty breathing, headache or visual disturbances   Complete by: As directed    Call MD for:  extreme fatigue   Complete by: As directed    Call MD for:  hives   Complete by: As directed    Call MD for:  persistant dizziness or light-headedness   Complete by: As directed    Call MD for:  persistant nausea and vomiting   Complete by: As directed    Call MD for:  redness, tenderness, or signs of infection (pain, swelling, redness, odor or green/yellow discharge around incision site)   Complete by: As directed    Call MD for:  severe uncontrolled pain   Complete by: As directed    Call MD for:  temperature >100.4   Complete by: As directed    Diet - low sodium heart healthy   Complete by: As directed    Diet Carb Modified   Complete by: As directed    Increase activity slowly   Complete by: As directed      Allergies as of 06/19/2019      Reactions   Clams [shellfish Allergy]    And shrimp   Other Other (See Comments)   Eyes and nose running      Medication List    STOP taking these medications   Januvia 100 MG tablet Generic drug: sitaGLIPtin   ketoconazole 2 % shampoo Commonly known as: NIZORAL     TAKE these medications   blood glucose meter kit and supplies Kit Dispense based on patient and insurance preference. Use up to four times daily as directed. (FOR ICD-9 250.00, 250.01).   cetirizine 10 MG tablet Commonly known as: ZYRTEC Take 1 tablet (10 mg total) by mouth daily. What changed:   when to take this  reasons to take this   EPINEPHrine 0.3 mg/0.3 mL Soaj injection Commonly known as: EpiPen 2-Pak Inject 0.3 mLs (0.3 mg total) into the muscle once as needed (anaphylaxis).   ibuprofen 400 MG tablet Commonly known as: ADVIL Take 400 mg by mouth as needed for mild pain or moderate pain.   insulin glargine 100 unit/mL Sopn Commonly known as: LANTUS Inject 0.4 mLs (40 Units  total) into the skin daily.   Insulin Pen Needle 32G X 4 MM Misc 1 Container by Does not apply route 4 (four) times daily -  before meals and at bedtime.   metFORMIN 850 MG tablet Commonly known as: GLUCOPHAGE Take 1 tablet (850 mg total) by mouth 2 (two) times daily with a meal.   metoprolol tartrate 50 MG tablet Commonly known as: LOPRESSOR Take 1 tablet (50 mg total) by mouth 2 (two) times daily.   NON FORMULARY Take 1 tablet by mouth as needed (constipation). Stool softner   NovoLOG FlexPen 100 UNIT/ML FlexPen Generic drug: insulin aspart Inject 0-9 Units into the skin 3 (three) times daily with meals. CBG < 70 : Hypoglycemia Protocol and give  Patient Snack CBG 70-120: 0 Units CBG 121-150: 1 unit CBG 151-200: 2 units CBG 201-250: 3 Units  CBG 251-300: 5 units CBG 301-350: 7 units CBG 351-400: 9 units  CBG >400: Call PCP   ondansetron 4 MG tablet Commonly known as: ZOFRAN Take 1 tablet (4 mg total) by mouth every 6 (six) hours as needed for nausea.   polyethylene glycol 17 g packet Commonly known as: MIRALAX / GLYCOLAX Take 17 g by mouth daily as needed.   senna-docusate 8.6-50 MG tablet Commonly known as: Senokot-S Take 1 tablet by mouth at bedtime.      Follow-up Information    Leone Haven, MD. Call.   Specialty: Family Medicine Why: FOllow up within 1-2 weeks Contact information: [place] [address] [phone]        [facility] [employer] Follow up.   Specialty: Home Health Services Why: they will call you in a few days to set up yur first home visit         Allergies  Allergen Reactions  . Clams [Shellfish Allergy]     And shrimp  . Other Other (See Comments)    Eyes and nose running   Consultations:  Diabetes education coordinator  Procedures/Studies: CT ANGIO CHEST PE W OR WO CONTRAST  Result Date: 06/16/2019 CLINICAL DATA:  Shortness of breath and tachycardia EXAM: CT ANGIOGRAPHY CHEST  WITH CONTRAST TECHNIQUE: Multidetector CT imaging of the chest was performed using the standard protocol during bolus administration of intravenous contrast. Multiplanar CT image reconstructions and MIPs were obtained to evaluate the vascular anatomy. CONTRAST:  172m OMNIPAQUE IOHEXOL 350 MG/ML SOLN COMPARISON:  Chest radiograph June 15, 2019 FINDINGS: Cardiovascular: There is no demonstrable pulmonary embolus. There is no thoracic aortic aneurysm or dissection. Visualized great vessels appear unremarkable. Note that the right innominate and left common carotid arteries arise as a common trunk, an anatomic variant. There is no evident pericardial effusion or pericardial thickening. Mediastinum/Nodes: Thyroid appears normal. No evident thoracic adenopathy. There is a small hiatal hernia. Lungs/Pleura: Lungs are clear.  No pleural effusions are evident. Upper Abdomen: There is hepatic steatosis. Visualized upper abdominal structures otherwise appear unremarkable. Musculoskeletal: No blastic or lytic bone lesions are evident. No chest wall lesions. Review of the MIP images confirms the above findings. IMPRESSION: 1. No demonstrable pulmonary embolus. No thoracic aortic aneurysm or dissection. 2.  Lungs clear. 3.  No adenopathy. 4.  Small hiatal hernia. 5.  Hepatic steatosis. Electronically Signed   By: WLowella GripIII M.D.   On: 06/16/2019 11:12   CT Abdomen Pelvis W Contrast  Result Date: 06/14/2019 CLINICAL DATA:  Vomiting the past 2 days. Elevated blood sugars. EXAM: CT ABDOMEN AND PELVIS WITH CONTRAST TECHNIQUE: Multidetector CT imaging of the abdomen and pelvis was performed using the standard protocol following bolus administration of intravenous contrast. Automatic exposure control utilized. CONTRAST:  1060mOMNIPAQUE IOHEXOL 300 MG/ML  SOLN COMPARISON:  None. FINDINGS: Lower chest: Normal heart size. No pneumonia or dependent pleural fluid. Small hiatal hernia. Respiratory motion markedly degrading  evaluation of the partially imaged superior mediastinal soft tissues. Hepatobiliary: Marked fatty infiltration of the liver with probable foci of focal fatty sparing measuring 16 mm and 11 mm at the gallbladder fossa. Normal gallbladder and biliary tree. Mild hepatomegaly. Patent portal and hepatic veins. Normal smooth hepatic contours without cirrhosis or ascites. Pancreas: No acute pancreatitis or apparent abnormality. Spleen: Normal. Adrenals/Urinary Tract: Normal. Stomach/Bowel: No apparent gastric wall thickening or  perigastric inflammatory change. Decompressed small bowel without obstruction. Normal appendix, series 3, image 65. Mild diverticulosis coli and moderate colonic stool burden. No apparent bowel wall thickening. 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 Left dual renal veins, retroaortic and pre aortic. No abdominal or pelvic adenopathy. Small nonspecific subcentimeter short axis diameter lymph node at the diaphragmatic crus. No abdominal abscess, free intraperitoneal fluid or air. Reproductive: Normally sized uterus with normal uterine contours. Symmetrical size of both ovaries and a bilateral normal ovarian follicle. Other: Small periumbilical herniation of fat without strangulation or incarceration. Musculoskeletal: Mild skeletal degenerative change. Probable benign bone island in the left acetabulum IMPRESSION: 1. No acute CT finding to account for the patient's symptoms. 2. Marked fatty infiltration of the liver with mild hepatomegaly and two foci of probable focal fatty sparing at the gallbladder fossa. 3. Small hiatal hernia. 4. Mild diverticulosis coli and moderate stool burden without evidence of acute diverticulitis or bowel obstruction. Electronically Signed   By: Revonda Humphrey   On: 06/14/2019 23:18   DG Chest Port 1 View  Result Date: 06/15/2019 CLINICAL DATA:  24 year old with shortness of breath. EXAM:  PORTABLE CHEST 1 VIEW COMPARISON:  None. FINDINGS: Markedly suboptimal inspiration which accounts for crowded bronchovascular markings diffusely, RIGHT basilar atelectasis, and which accentuates the heart size. Taking this into account, cardiomediastinal silhouette unremarkable. Lungs otherwise clear. Normal pulmonary vascularity. IMPRESSION: Markedly suboptimal inspiration which accounts for RIGHT basilar atelectasis. No acute cardiopulmonary disease otherwise. Electronically Signed   By: Evangeline Dakin M.D.   On: 06/15/2019 13:31   Overnight EEG with video  Result Date: 06/19/2019 Lora Havens, MD     06/19/2019  7:56 AM Patient Name: Amanda Snyder MRN: [id number] Epilepsy Attending: Lora Havens Referring Physician/Provider: Dr Baird Kay Duration: 06/18/2019 1608 to 06/19/2019 0730 Patient history: 24yo Intellectually disabled female with episodes of staring. EEG to evaluate for seizure. Level of alertness: awake, asleep AEDs during EEG study: Technical aspects: This EEG study was done with scalp electrodes positioned according to the 10-20 International system of electrode placement. Electrical activity was acquired at a sampling rate of 500Hz  and reviewed with a high frequency filter of 70Hz  and a low frequency filter of 1Hz . EEG data were recorded continuously and digitally stored. DESCRIPTION:  The posterior dominant rhythm consists of 9-10 Hz activity of moderate voltage (25-35 uV) seen predominantly in posterior head regions, symmetric and reactive to eye opening and eye closing. Sleep was characterized by vertex waves, sleep spindles (12-14hz ), maximal frontocentral, REM sleep. Hyperventilation and photic stimulation were not performed. Event button was pressed on 06/18/2019 at 1657 and 06/19/2019 at 0707 for unclear reasons. Concomitant eeg before, during and after the event didn't show any eeg change to suggest seizure. IMPRESSION: This study is within normal limits. No seizures or  epileptiform discharges were seen throughout the recording. Event button was pressed twice as described above without eeg change and were likely non epileptic. Priyanka Barbra Sarks    Subjective: Seen and examined at bedside and patient has significant intellectual disability and cannot provide a subjective history.  Mother states that she is doing better.  Happy that her heart rates are better.  Mother asked about her questionable seizure activity I told her that she is having nonepileptic episodes of what ever she is doing.  Mother was happy to hear this.  No other concerns or complaints at this time and mother was given diabetic education teaching and  will be discharged on long-acting and sliding scale insulin.  She will need further diabetic medication titration outpatient setting and patient mother is understandable agreeable with the plan of care.  Discharge Exam: Vitals:   06/19/19 0500 06/19/19 0818  BP:  136/88  Pulse: (!) 102 (!) 105  Resp: (!) 26   Temp:    SpO2: 97%    Vitals:   06/18/19 2309 06/19/19 0400 06/19/19 0500 06/19/19 0818  BP: (!) 129/95 (!) 128/94  136/88  Pulse: 98 96 (!) 102 (!) 105  Resp: 15 (!) 34 (!) 26   Temp: 98.8 F (37.1 C) 98.6 F (37 C)    TempSrc: Axillary Oral    SpO2: 91% 94% 97%   Weight:      Height:       General: Pt is alert, awake, not in acute distress Cardiovascular: Regular rate and rhythm with rates in the 90s, S1/S2 +, no rubs, no gallops Respiratory: Slightly diminished bilaterally, no wheezing, no rhonchi; unlabored breathing but she is slightly tachypneic Abdominal: Soft, NT, distended secondary body habitus, bowel sounds + Extremities: no edema, no cyanosis  The results of significant diagnostics from this hospitalization (including imaging, microbiology, ancillary and laboratory) are listed below for reference.    Microbiology: Recent Results (from the past 240 hour(s))  SARS CORONAVIRUS 2 (TAT 6-24 HRS) Nasopharyngeal  Nasopharyngeal Swab     Status: None   Collection Time: 06/15/19 12:37 AM   Specimen: Nasopharyngeal Swab  Result Value Ref Range Status   SARS Coronavirus 2 NEGATIVE NEGATIVE Final    Comment: (NOTE) SARS-CoV-2 target nucleic acids are NOT DETECTED. The SARS-CoV-2 RNA is generally detectable in upper and lower respiratory specimens during the acute phase of infection. Negative results do not preclude SARS-CoV-2 infection, do not rule out co-infections with other pathogens, and should not be used as the sole basis for treatment or other patient management decisions. Negative results must be combined with clinical observations, patient history, and epidemiological information. The expected result is Negative. Fact Sheet for Patients: SugarRoll.be Fact Sheet for Healthcare Providers: https://www.woods-mathews.com/ This test is not yet approved or cleared by the [place] FDA and  has been authorized for detection and/or diagnosis of SARS-CoV-2 by FDA under an Emergency Use Authorization (EUA). This EUA will remain  in effect (meaning this test can be used) for the duration of the COVID-19 declaration under Section 56 4(b)(1) of the Act, 21 U.S.C. section 360bbb-3(b)(1), unless the authorization is terminated or revoked sooner. Performed at [facility], [place] [address] [place], [place] [address]      Labs: BNP (last 3 results) No results for input(s): BNP in the last 8760 hours. Basic Metabolic Panel: Recent Labs  Lab 06/15/19 1518 06/16/19 0348 06/17/19 0759 06/18/19 0328 06/19/19 0237  NA 135 137 134* 137 136  K 3.8 3.7 3.4* 3.5 3.8  CL 100 102 103 103 104  CO2 22 21* 21* 22 23  GLUCOSE 316* 324* 384* 243* 226*  BUN 5* 6 <5* 5* 5*  CREATININE 0.85 0.63 0.56 0.51 0.54  CALCIUM 8.1* 8.0* 7.8* 8.4* 8.8*  MG 1.6* 2.1 1.8 1.9 1.9  PHOS  --   --  2.1* 2.7 3.0   Liver Function Tests: Recent Labs  Lab 06/14/19 1930  06/17/19 0759 06/18/19 0328 06/19/19 0237  AST 53* 37 46* 59*  ALT 81* 51* 52* 66*  ALKPHOS 83 57 55 57  BILITOT 0.7 1.1 0.9 0.5  PROT 8.0 6.4* 6.2* 6.5  ALBUMIN 4.0 2.9* 3.0* 3.0*   Recent Labs  Lab 06/14/19 1930  LIPASE 22   No results for input(s): AMMONIA in the last 168 hours. CBC: Recent Labs  Lab 06/15/19 0218 06/16/19 0348 06/17/19 0759 06/18/19 0328 06/19/19 0237  WBC 13.2* 7.4 5.5 5.9 7.1  NEUTROABS  --  3.0 2.7 2.4 2.8  HGB 12.7 11.1* 11.3* 11.4* 12.0  HCT 42.3 35.4* 36.7 36.7 38.8  MCV 72.7* 70.7* 71.0* 71.7* 71.7*  PLT 439* 322 325 359 397   Cardiac Enzymes: No results for input(s): CKTOTAL, CKMB, CKMBINDEX, TROPONINI in the last 168 hours. BNP: Invalid input(s): POCBNP CBG: Recent Labs  Lab 06/18/19 1204 06/18/19 1639 06/18/19 2109 06/19/19 0723 06/19/19 1240  GLUCAP 243* 266* 262* 239* 331*   D-Dimer No results for input(s): DDIMER in the last 72 hours. Hgb A1c No results for input(s): HGBA1C in the last 72 hours. Lipid Profile No results for input(s): CHOL, HDL, LDLCALC, TRIG, CHOLHDL, LDLDIRECT in the last 72 hours. Thyroid function studies Recent Labs    06/17/19 0759  TSH 2.426   Anemia work up Recent Labs    06/17/19 0759  VITAMINB12 470  FOLATE 5.9*  FERRITIN 69  TIBC 287  IRON 80  RETICCTPCT 1.5   Urinalysis    Component Value Date/Time   LABSPEC <=1.005 06/14/2019 1734   PHURINE 5.0 06/14/2019 1734   GLUCOSEU >=1000 (A) 06/14/2019 1734   HGBUR LARGE (A) 06/14/2019 1734   BILIRUBINUR NEGATIVE 06/14/2019 1734   KETONESUR NEGATIVE 06/14/2019 1734   PROTEINUR 30 (A) 06/14/2019 1734   UROBILINOGEN 0.2 06/14/2019 1734   NITRITE NEGATIVE 06/14/2019 1734   LEUKOCYTESUR NEGATIVE 06/14/2019 1734   Sepsis Labs Invalid input(s): PROCALCITONIN,  WBC,  LACTICIDVEN Microbiology Recent Results (from the past 240 hour(s))  SARS CORONAVIRUS 2 (TAT 6-24 HRS) Nasopharyngeal Nasopharyngeal Swab     Status: None   Collection  Time: 06/15/19 12:37 AM   Specimen: Nasopharyngeal Swab  Result Value Ref Range Status   SARS Coronavirus 2 NEGATIVE NEGATIVE Final    Comment: (NOTE) SARS-CoV-2 target nucleic acids are NOT DETECTED. The SARS-CoV-2 RNA is generally detectable in upper and lower respiratory specimens during the acute phase of infection. Negative results do not preclude SARS-CoV-2 infection, do not rule out co-infections with other pathogens, and should not be used as the sole basis for treatment or other patient management decisions. Negative results must be combined with clinical observations, patient history, and epidemiological information. The expected result is Negative. Fact Sheet for Patients: SugarRoll.be Fact Sheet for Healthcare Providers: https://www.woods-mathews.com/ This test is not yet approved or cleared by the [place] FDA and  has been authorized for detection and/or diagnosis of SARS-CoV-2 by FDA under an Emergency Use Authorization (EUA). This EUA will remain  in effect (meaning this test can be used) for the duration of the COVID-19 declaration under Section 56 4(b)(1) of the Act, 21 U.S.C. section 360bbb-3(b)(1), unless the authorization is terminated or revoked sooner. Performed at [facility], [place] [address], [place], [place] [address]    Time coordinating discharge: 35 minutes  SIGNED:  Kerney Elbe, DO [facility] 06/19/2019, 7:27 PM Pager is on [place]  If 7PM-7AM, please contact night-coverage [email or web address] Password [email or web address]

## 2019-06-19 NOTE — Progress Notes (Signed)
Inpatient Diabetes Program Recommendations  AACE/ADA: New Consensus Statement on Inpatient Glycemic Control (2015)  Target Ranges:  Prepandial:   less than 140 mg/dL      Peak postprandial:   less than 180 mg/dL (1-2 hours)      Critically ill patients:  140 - 180 mg/dL   Lab Results  Component Value Date   GLUCAP 239 (H) 06/19/2019   HGBA1C 9.9 (H) 06/14/2019      Diabetes history: DM2    Note:  Patient discharging home today with mother.  Will start taking Lantus 40 daily + Novolog correction and Metformin 850 mg twice daily.    Called and spoke with Solmon Ice, patient's mother.  Reviewed insulin administration instructions, blood sugar monitoring and hypoglycemia and treatment.  She will call PCP for CBG's consistently >200 and <100.  All questions answered.    Flexpen Order #[id number] Insulin pen needles order # [id number]  Thank you, Reche Dixon, RN, BSN Diabetes Coordinator Inpatient Diabetes Program [phone] (team pager from 8a-5p)

## 2019-06-19 NOTE — Progress Notes (Signed)
LTM EEG discontinued - no skin breakdown at unhook.   

## 2019-06-22 ENCOUNTER — Telehealth: Payer: Self-pay

## 2019-06-22 DIAGNOSIS — Z7984 Long term (current) use of oral hypoglycemic drugs: Secondary | ICD-10-CM | POA: Diagnosis not present

## 2019-06-22 DIAGNOSIS — D509 Iron deficiency anemia, unspecified: Secondary | ICD-10-CM | POA: Diagnosis not present

## 2019-06-22 DIAGNOSIS — F909 Attention-deficit hyperactivity disorder, unspecified type: Secondary | ICD-10-CM | POA: Diagnosis not present

## 2019-06-22 DIAGNOSIS — F72 Severe intellectual disabilities: Secondary | ICD-10-CM | POA: Diagnosis not present

## 2019-06-22 DIAGNOSIS — E876 Hypokalemia: Secondary | ICD-10-CM | POA: Diagnosis not present

## 2019-06-22 DIAGNOSIS — K760 Fatty (change of) liver, not elsewhere classified: Secondary | ICD-10-CM | POA: Diagnosis not present

## 2019-06-22 DIAGNOSIS — Z6841 Body Mass Index (BMI) 40.0 and over, adult: Secondary | ICD-10-CM | POA: Diagnosis not present

## 2019-06-22 DIAGNOSIS — E1165 Type 2 diabetes mellitus with hyperglycemia: Secondary | ICD-10-CM | POA: Diagnosis not present

## 2019-06-22 DIAGNOSIS — I10 Essential (primary) hypertension: Secondary | ICD-10-CM | POA: Diagnosis not present

## 2019-06-22 DIAGNOSIS — R000 Tachycardia, unspecified: Secondary | ICD-10-CM | POA: Diagnosis not present

## 2019-06-22 DIAGNOSIS — G40909 Epilepsy, unspecified, not intractable, without status epilepticus: Secondary | ICD-10-CM | POA: Diagnosis not present

## 2019-06-22 DIAGNOSIS — E871 Hypo-osmolality and hyponatremia: Secondary | ICD-10-CM | POA: Diagnosis not present

## 2019-06-22 NOTE — Telephone Encounter (Signed)
Transition Care Management Follow-up Telephone Call  Date of discharge and from where: Saturday, 06/19/19 from [facility].   How have you been since you were released from the hospital? Information received from mother. "She collapsed on yesterday and the RN said it maybe due to low blood sugar. Doing well today. Vitals were going back and forth but I think she is just getting adjusted to insulin. She is doing okay." Denies N/V/D, ABD pain, chest pain, seizures, fever, difficulty breathing. Notes B/B output equal to input.   Any questions or concerns? Diabetic planning/teaching for mother.   Items Reviewed:  Did the pt receive and understand the discharge instructions provided? Yes.  Medications obtained and verified? Yes, Added Novolog flexpen insulin. Stop Januvia, ketoconazole shampoo. Taking all other scheduled medications as directed.   Any new allergies since your discharge? No.  Dietary orders reviewed? Yes, heart healthy, carb modified diet.  Do you have support at home? Yes, mother.   Functional Questionnaire: (I = Independent and D = Dependent) ADLs: D -Mother assist.  Follow up appointments reviewed:   PCP Hospital f/u appt confirmed?  Scheduled to see Dr. Caryl Bis on 06/25/19 @ 10:00 via virtual [phone].  Diabetic coordinator in outpatient setting confirmed? No. Awaiting follow up with pcp.   Home health RN confirmed? Yes  Are transportation arrangements needed? No  If their condition worsens, is the pt aware to call PCP or go to the Emergency Dept.? Yes  Was the patient provided with contact information for the PCP's office or ED? Yes  Was to pt encouraged to call back with questions or concerns? Yes

## 2019-06-22 NOTE — Telephone Encounter (Signed)
Reviewed

## 2019-06-25 ENCOUNTER — Ambulatory Visit (INDEPENDENT_AMBULATORY_CARE_PROVIDER_SITE_OTHER): Payer: Medicare Other | Admitting: Family Medicine

## 2019-06-25 ENCOUNTER — Other Ambulatory Visit: Payer: Self-pay

## 2019-06-25 ENCOUNTER — Encounter: Payer: Self-pay | Admitting: Family Medicine

## 2019-06-25 VITALS — Ht 61.0 in | Wt 281.4 lb

## 2019-06-25 DIAGNOSIS — K5900 Constipation, unspecified: Secondary | ICD-10-CM

## 2019-06-25 DIAGNOSIS — R000 Tachycardia, unspecified: Secondary | ICD-10-CM

## 2019-06-25 DIAGNOSIS — D649 Anemia, unspecified: Secondary | ICD-10-CM | POA: Diagnosis not present

## 2019-06-25 DIAGNOSIS — E119 Type 2 diabetes mellitus without complications: Secondary | ICD-10-CM | POA: Diagnosis not present

## 2019-06-25 DIAGNOSIS — R32 Unspecified urinary incontinence: Secondary | ICD-10-CM | POA: Diagnosis not present

## 2019-06-25 DIAGNOSIS — F72 Severe intellectual disabilities: Secondary | ICD-10-CM

## 2019-06-25 NOTE — Telephone Encounter (Signed)
Discussed during office visit.

## 2019-06-25 NOTE — Progress Notes (Addendum)
Virtual Visit via telephone Note  This visit type was conducted due to national recommendations for restrictions regarding the COVID-19 pandemic (e.g. social distancing).  This format is felt to be most appropriate for this patient at this time.  All issues noted in this document were discussed and addressed.  No physical exam was performed (except for noted visual exam findings with Video Visits).   I connected with Amanda Snyder today at 10:00 AM EDT by telephone and verified that I am speaking with the correct person using two identifiers. Location patient: home Location provider: home office Persons participating in the virtual visit: patient, provider, Angeliz Settlemyre (mother)  I discussed the limitations, risks, security and privacy concerns of performing an evaluation and management service by telephone and the availability of in person appointments. I also discussed with the patient that there may be a patient responsible charge related to this service. The patient expressed understanding and agreed to proceed.  Interactive audio and video telecommunications were attempted between this provider and patient, however failed, due to patient having technical difficulties OR patient did not have access to video capability.  We continued and completed visit with audio only.   Reason for visit: Hospital follow-up  HPI: Hyperglycemia: Patient was hospitalized for severe hyperglycemia related to diabetes.  She was evaluated in urgent care and was noted to be hyperglycemic with blood sugar greater than 600.  She was subsequently directed to the ED and was admitted.  Insulin was titrated and she was discharged on Lantus 40 units and sliding scale NovoLog.  The patient's mother notes her CBGs have been ranging between 131 and 274.  The patient has gotten tired of the frequent fingersticks and they are wondering about a Dexcom unit.  The patient's mother does note there have been a couple of  occasions where she acts a little strange and tired right after getting her NovoLog though when she has checked her glucose it was in the 290s.  This lasted very briefly and improves with eating.  She does wait to give her Lantus until after she eats.  They do report that there was a likely episode of hypoglycemia when the patient was in the shower several days ago.  She became generally weak and went down to the shower floor.  She did not lose consciousness.  She gave her a mint and some juice and she started to feel better.  Her BP was 101/63.  She did not check her sugar when that occurred.  She continues on Metformin as well.   Tachycardia: Patient was started on a beta-blocker for this.  Noted to be persistently tachycardic in the hospital and did undergo evaluation for PE which was negative.  Heart rate has mostly been in the 90s since discharge though did have one at 110.  There was question of possible seizure activity given staring episodes though EEG was negative.  Constipation: No BM though has been having good BMs since discharge.  Currently on stool softener most evenings.  They do have MiraLAX that she is not started on them.  Pain.   ROS: See pertinent positives and negatives per HPI.  Past Medical History:  Diagnosis Date  . ADHD (attention deficit hyperactivity disorder) 2000   mom opted not to give medications (failed Adderall - 'zombielike', failed Strattera - 'no difference')  . Allergy    suspected allergy to pollen or dog(s)  . Anemia   . Diabetes mellitus without complication ([place])   . Hypertension   .  Mental retardation 1997   Severe MR. did not walk until age 56-3 years, received Early Intervention.  . Nondisplaced fracture of proximal phalanx of right great toe, initial encounter for closed fracture 10/06/2017  . Obesity 12  . Premature birth with gestation of 35-36 weeks    [redacted] weeks GA  . Seizures ([place]) From age 79 month to 6 months   took Phenobarbital for a few  years. weaned off after moving from [place] to [place]. Unknown etiology.  . Syncope 2006   witnessed by sister, no workup    Past Surgical History:  Procedure Laterality Date  . NO PAST SURGERIES      Family History  Problem Relation Age of Onset  . Stroke Mother 60  . Psoriasis Mother   . Alcohol abuse Mother   . Hyperlipidemia Mother   . Mental retardation Mother   . Hypertension Mother   . Alcohol abuse Brother   . Other Father        Died from gunshot wound.  . Cancer Maternal Grandmother 48       breast ca  . Breast cancer Maternal Grandmother   . Alcohol abuse Maternal Grandfather   . Diabetes Paternal Grandmother     SOCIAL HX: Non-smoker   Current Outpatient Medications:  .  blood glucose meter kit and supplies KIT, Dispense based on patient and insurance preference. Use up to four times daily as directed. (FOR ICD-9 250.00, 250.01)., Disp: 1 each, Rfl: 0 .  cetirizine (ZYRTEC) 10 MG tablet, Take 1 tablet (10 mg total) by mouth daily. (Patient taking differently: Take 10 mg by mouth as needed for allergies. ), Disp: 30 tablet, Rfl: 11 .  EPINEPHrine (EPIPEN 2-PAK) 0.3 mg/0.3 mL IJ SOAJ injection, Inject 0.3 mLs (0.3 mg total) into the muscle once as needed (anaphylaxis)., Disp: 1 Device, Rfl: 0 .  ibuprofen (ADVIL) 400 MG tablet, Take 400 mg by mouth as needed for mild pain or moderate pain. , Disp: , Rfl:  .  insulin aspart (NOVOLOG FLEXPEN) 100 UNIT/ML FlexPen, Inject 0-9 Units into the skin 3 (three) times daily with meals. CBG < 70 : Hypoglycemia Protocol and give Patient Snack CBG 70-120: 0 Units CBG 121-150: 1 unit CBG 151-200: 2 units CBG 201-250: 3 Units  CBG 251-300: 5 units CBG 301-350: 7 units CBG 351-400: 9 units  CBG >400: Call PCP, Disp: 15 mL, Rfl: 11 .  insulin glargine (LANTUS) 100 unit/mL SOPN, Inject 0.4 mLs (40 Units total) into the skin daily., Disp: 15 mL, Rfl: 11 .  Insulin Pen Needle 32G X 4 MM MISC, 1 Container by Does not apply route 4 (four) times daily  -  before meals and at bedtime., Disp: 100 each, Rfl: 0 .  metFORMIN (GLUCOPHAGE) 850 MG tablet, Take 1 tablet (850 mg total) by mouth 2 (two) times daily with a meal., Disp: 180 tablet, Rfl: 1 .  metoprolol tartrate (LOPRESSOR) 50 MG tablet, Take 1 tablet (50 mg total) by mouth 2 (two) times daily., Disp: 60 tablet, Rfl: 0 .  NON FORMULARY, Take 1 tablet by mouth as needed (constipation). Stool softner , Disp: , Rfl:  .  ondansetron (ZOFRAN) 4 MG tablet, Take 1 tablet (4 mg total) by mouth every 6 (six) hours as needed for nausea., Disp: 20 tablet, Rfl: 0 .  polyethylene glycol (MIRALAX / GLYCOLAX) 17 g packet, Take 17 g by mouth daily as needed., Disp: 14 each, Rfl: 0 .  senna-docusate (SENOKOT-S) 8.6-50 MG tablet, Take 1  tablet by mouth at bedtime., Disp: 30 tablet, Rfl: 0  EXAM: This was a telephone visit and thus no physical exam was completed.  ASSESSMENT AND PLAN:  Discussed the following assessment and plan:  Diabetes ([place]) Significant worsening compared to previously requiring hospitalization.  Glucose has trended down with current regimen of insulin.  She will continue with her current regimen.  Will refer to endocrinology given complex management with her developmental delay.  We will place an order for Dexcom.  We will place an order for diabetic education.  Lab work as outlined below as requested by hospitalist.  Tachycardia I suspect a lot of this is related to her activity when vital signs are being checked.  Has improved with beta-blocker.  He will continue to monitor and the monitor her blood pressure closely.  If she starts to run above 100 despite beta-blocker therapy we could consider cardiology evaluation.  Incontinence Mother reports that she needs a prescription for a new plastic covered bed.  Constipation Discussed trying MiraLAX first and then senna if needed.   Orders Placed This Encounter  Procedures  . CBC    Standing Status:   Future    Standing Expiration  Date:   06/26/2020  . Comp Met (CMET)    Standing Status:   Future    Standing Expiration Date:   06/26/2020  . Magnesium    Standing Status:   Future    Standing Expiration Date:   06/26/2020  . Phosphorus    Standing Status:   Future    Standing Expiration Date:   06/26/2020  . Ambulatory referral to Endocrinology    Referral Priority:   Routine    Referral Type:   Consultation    Referral Reason:   Specialty Services Required    Number of Visits Requested:   1  . Ambulatory referral to diabetic education    Referral Priority:   Routine    Referral Type:   Consultation    Referral Reason:   Specialty Services Required    Number of Visits Requested:   1    No orders of the defined types were placed in this encounter.    I discussed the assessment and treatment plan with the patient. The patient was provided an opportunity to ask questions and all were answered. The patient agreed with the plan and demonstrated an understanding of the instructions.   The patient was advised to call back or seek an in-person evaluation if the symptoms worsen or if the condition fails to improve as anticipated.  I provided 25 minutes of non-face-to-face time during this encounter.   [name], MD

## 2019-06-27 ENCOUNTER — Encounter: Payer: Self-pay | Admitting: Family Medicine

## 2019-06-27 DIAGNOSIS — K5900 Constipation, unspecified: Secondary | ICD-10-CM | POA: Insufficient documentation

## 2019-06-27 NOTE — Assessment & Plan Note (Signed)
Significant worsening compared to previously requiring hospitalization.  Glucose has trended down with current regimen of insulin.  She will continue with her current regimen.  Will refer to endocrinology given complex management with her developmental delay.  We will place an order for Dexcom.  We will place an order for diabetic education.  Lab work as outlined below as requested by hospitalist.

## 2019-06-27 NOTE — Assessment & Plan Note (Signed)
Discussed trying MiraLAX first and then senna if needed.

## 2019-06-27 NOTE — Assessment & Plan Note (Signed)
Mother reports that she needs a prescription for a new plastic covered bed.

## 2019-06-27 NOTE — Assessment & Plan Note (Signed)
I suspect a lot of this is related to her activity when vital signs are being checked.  Has improved with beta-blocker.  He will continue to monitor and the monitor her blood pressure closely.  If she starts to run above 100 despite beta-blocker therapy we could consider cardiology evaluation.

## 2019-06-28 ENCOUNTER — Telehealth: Payer: Self-pay | Admitting: *Deleted

## 2019-06-28 DIAGNOSIS — I10 Essential (primary) hypertension: Secondary | ICD-10-CM | POA: Diagnosis not present

## 2019-06-28 DIAGNOSIS — E1165 Type 2 diabetes mellitus with hyperglycemia: Secondary | ICD-10-CM | POA: Diagnosis not present

## 2019-06-28 DIAGNOSIS — K760 Fatty (change of) liver, not elsewhere classified: Secondary | ICD-10-CM | POA: Diagnosis not present

## 2019-06-28 DIAGNOSIS — F72 Severe intellectual disabilities: Secondary | ICD-10-CM | POA: Diagnosis not present

## 2019-06-28 DIAGNOSIS — D509 Iron deficiency anemia, unspecified: Secondary | ICD-10-CM | POA: Diagnosis not present

## 2019-06-28 DIAGNOSIS — R000 Tachycardia, unspecified: Secondary | ICD-10-CM | POA: Diagnosis not present

## 2019-06-28 NOTE — Addendum Note (Signed)
Addended by: Nanci Pina on: 06/28/2019 02:47 PM   Modules accepted: Orders

## 2019-06-28 NOTE — Telephone Encounter (Signed)
I called and mother would like DME for mattress mailed to her I have completed this and mailed DME order. Patient mother asked about a Dexacon Meter has this been ordered.

## 2019-06-28 NOTE — Telephone Encounter (Signed)
-----   Message from Leone Haven, MD sent at 06/27/2019 11:40 AM EDT ----- Amanda Snyder, This patient needs a new plastic covered mattress due to incontinence issues and developmental delay. Do you know what the order would be for this? Thanks. Randall Hiss

## 2019-06-28 NOTE — Telephone Encounter (Signed)
I had to check with the clinical pharmacist to see how to order this specifically for her. I still need to place the order for this.

## 2019-06-29 ENCOUNTER — Encounter: Payer: Self-pay | Admitting: Family Medicine

## 2019-06-29 ENCOUNTER — Telehealth: Payer: Self-pay | Admitting: Family Medicine

## 2019-06-29 DIAGNOSIS — G4730 Sleep apnea, unspecified: Secondary | ICD-10-CM

## 2019-06-29 NOTE — Telephone Encounter (Signed)
-----   Message from De Hollingshead, [facility] sent at 06/28/2019  8:17 AM EDT ----- Marykay Lex,   I refer to this everytime [email or web address]://provider.dexcom.[email or web address]  Scroll down to "What to Prescribe". The order is built TERRIBLY in [place], so I do the best I can.   Catie ----- Message ----- From: Leone Haven, MD Sent: 06/27/2019  11:38 AM EDT To: De Hollingshead, [facility]  Hey Catie,   This patients mother asked about getting a dexcom for her. I tried ordering it but there were numerous options that came up. Which should I choose?  Randall Hiss

## 2019-06-29 NOTE — Telephone Encounter (Signed)
I called the patient's mother and she stated the [facility] [address] in [place]. Patient also wants a hospital bed for the patient to help with her sleep apnea, she states the patient will not keep a mask on and her heart rate stays elevated.  Sowmya Partridge,cma

## 2019-06-29 NOTE — Addendum Note (Signed)
Addended by: Leone Haven on: 06/29/2019 07:49 AM   Modules accepted: Orders

## 2019-06-29 NOTE — Telephone Encounter (Signed)
Please call the patients mother and let her know that I am going to send the dexcom in for her though [facility]. Can you [facility] out [facility] and then I can send it in?

## 2019-06-30 ENCOUNTER — Telehealth: Payer: Self-pay | Admitting: Family Medicine

## 2019-06-30 ENCOUNTER — Encounter: Payer: Self-pay | Admitting: Family Medicine

## 2019-06-30 ENCOUNTER — Other Ambulatory Visit: Payer: Medicare Other

## 2019-06-30 DIAGNOSIS — D649 Anemia, unspecified: Secondary | ICD-10-CM

## 2019-06-30 DIAGNOSIS — E119 Type 2 diabetes mellitus without complications: Secondary | ICD-10-CM

## 2019-06-30 LAB — COMPREHENSIVE METABOLIC PANEL
ALT: 95 U/L — ABNORMAL HIGH (ref 0–35)
AST: 65 U/L — ABNORMAL HIGH (ref 0–37)
Albumin: 4 g/dL (ref 3.5–5.2)
Alkaline Phosphatase: 58 U/L (ref 39–117)
BUN: 12 mg/dL (ref 6–23)
CO2: 26 mEq/L (ref 19–32)
Calcium: 9.7 mg/dL (ref 8.4–10.5)
Chloride: 100 mEq/L (ref 96–112)
Creatinine, Ser: 0.52 mg/dL (ref 0.40–1.20)
GFR: 172.44 mL/min (ref >60.00)
Glucose, Bld: 120 mg/dL — ABNORMAL HIGH (ref 70–99)
Potassium: 4 mEq/L (ref 3.5–5.1)
Sodium: 135 mEq/L (ref 135–145)
Total Bilirubin: 0.4 mg/dL (ref 0.2–1.2)
Total Protein: 7.3 g/dL (ref 6.0–8.3)

## 2019-06-30 LAB — CBC
HCT: 38.2 % (ref 36.0–46.0)
Hemoglobin: 12 g/dL (ref 12.0–15.0)
MCHC: 31.4 g/dL (ref 30.0–36.0)
MCV: 70.9 fl — ABNORMAL LOW (ref 78.0–100.0)
Platelets: 401 10*3/uL — ABNORMAL HIGH (ref 150.0–400.0)
RBC: 5.38 Mil/uL — ABNORMAL HIGH (ref 3.87–5.11)
RDW: 15.2 % (ref 11.5–15.5)
WBC: 6.9 10*3/uL (ref 4.0–10.5)

## 2019-06-30 LAB — MAGNESIUM: Magnesium: 1.5 mg/dL (ref 1.5–2.5)

## 2019-06-30 LAB — PHOSPHORUS: Phosphorus: 4.3 mg/dL (ref 2.3–4.6)

## 2019-06-30 MED ORDER — DEXCOM G6 TRANSMITTER MISC: 1.0000 | Freq: Four times a day (QID) | 3 refills | Status: DC

## 2019-06-30 MED ORDER — DEXCOM G6 RECEIVER DEVI: 1.0000 | Freq: Four times a day (QID) | 0 refills | Status: DC

## 2019-06-30 MED ORDER — DEXCOM G6 SENSOR MISC: 1.0000 | 3 refills | Status: DC

## 2019-06-30 NOTE — Telephone Encounter (Signed)
Patients mother stated that she was DX with sleep apnea and they gave her oxygen at night but through the night she would take the oxygen off and while in the hospital the doctors there noticed it and stated that she would need a bed to keep her head up because she did breath better that way, that's why she needs the bed.  Eliel Dudding,cma

## 2019-06-30 NOTE — Telephone Encounter (Signed)
Pt's mother states that pharmacy told her that Continuous Blood Gluc Receiver ([place]) DEVI is not covered by insurance and PCP will have to get it approved?  Brailen Macneal,cma

## 2019-06-30 NOTE — Telephone Encounter (Signed)
Amanda Snyder, I ordered this per the paperwork. Do you know if we have to do something else to get it approved?

## 2019-06-30 NOTE — Telephone Encounter (Signed)
I sent the Dexcom unit in already.  I am not sure she has a diagnosis that would require a hospital bed though I will forward to [facility] to see if we can do a DME order and see if it will get approved.

## 2019-06-30 NOTE — Telephone Encounter (Signed)
We can try using Kyphosis and Respiratory -sleep disorder for DX does lying flat effect sleep?

## 2019-06-30 NOTE — Telephone Encounter (Signed)
Can you check with the patients mother to see lying flat causes issues with her breathing during sleep?

## 2019-06-30 NOTE — Telephone Encounter (Signed)
Please see Nina's message. I think we can use sleep disordered breathing and see if her insurance will cover it.

## 2019-06-30 NOTE — Telephone Encounter (Signed)
Pt's mother states that pharmacy told her that Continuous Blood Gluc Receiver ([place]) DEVI is not covered by insurance and PCP will have to get it approved?

## 2019-06-30 NOTE — Addendum Note (Signed)
Addended by: Leone Haven on: 06/30/2019 10:36 AM   Modules accepted: Orders

## 2019-07-01 ENCOUNTER — Encounter: Payer: Self-pay | Admitting: Family Medicine

## 2019-07-01 MED ORDER — GLUCOSE BLOOD VI STRP: ORAL_STRIP | 12 refills | Status: DC

## 2019-07-02 ENCOUNTER — Encounter: Payer: Self-pay | Admitting: Family Medicine

## 2019-07-02 NOTE — Addendum Note (Signed)
Addended by: Caryl Bis, Landon Truax G on: 07/02/2019 01:06 PM   Modules accepted: Orders

## 2019-07-02 NOTE — Telephone Encounter (Signed)
Can you call the pharmacy and see if they can tell us what test strips are covered by her insurance?

## 2019-07-05 MED ORDER — ONETOUCH ULTRASOFT LANCETS MISC: 12 refills | Status: DC

## 2019-07-05 MED ORDER — BLOOD GLUCOSE MONITOR KIT: PACK | 0 refills | Status: DC

## 2019-07-05 MED ORDER — BLOOD GLUCOSE MONITOR KIT: PACK | 0 refills | Status: AC

## 2019-07-05 MED ORDER — GLUCOSE BLOOD VI STRP: ORAL_STRIP | 12 refills | Status: AC

## 2019-07-05 NOTE — Telephone Encounter (Signed)
Script needs to be filed as part B medicare and not part D. Number and paper work given to [employer]

## 2019-07-05 NOTE — Telephone Encounter (Signed)
Sent to pharmacy 

## 2019-07-07 ENCOUNTER — Telehealth: Payer: Self-pay | Admitting: Family Medicine

## 2019-07-07 ENCOUNTER — Telehealth: Payer: Self-pay | Admitting: *Deleted

## 2019-07-07 DIAGNOSIS — F72 Severe intellectual disabilities: Secondary | ICD-10-CM | POA: Diagnosis not present

## 2019-07-07 DIAGNOSIS — I10 Essential (primary) hypertension: Secondary | ICD-10-CM | POA: Diagnosis not present

## 2019-07-07 DIAGNOSIS — K760 Fatty (change of) liver, not elsewhere classified: Secondary | ICD-10-CM | POA: Diagnosis not present

## 2019-07-07 DIAGNOSIS — D509 Iron deficiency anemia, unspecified: Secondary | ICD-10-CM | POA: Diagnosis not present

## 2019-07-07 DIAGNOSIS — E1165 Type 2 diabetes mellitus with hyperglycemia: Secondary | ICD-10-CM | POA: Diagnosis not present

## 2019-07-07 DIAGNOSIS — R000 Tachycardia, unspecified: Secondary | ICD-10-CM | POA: Diagnosis not present

## 2019-07-07 NOTE — Telephone Encounter (Signed)
Per conversation patient will need a virtual visit to discuss sleep apnea and effects on sleep  in order to qualify for DME for hospital bed.

## 2019-07-07 NOTE — Telephone Encounter (Signed)
Patient needs Virtual visit can be a [place] to discuss sleep apnea and need for DME order for hospital bed need these OV notes in order to process claim for bed. Please schedule patient for virtual visit as soon as possible.

## 2019-07-07 NOTE — Telephone Encounter (Signed)
Mother would like to know if she can give patient everyday or every other day Petra Kuba Made daily diabetes packet? Please call her.

## 2019-07-07 NOTE — Telephone Encounter (Signed)
Please call to confirm if this is a vitamin pack. I think it would be ok to take a multivitamin, though do not think she needs to take the diabetes health pack.

## 2019-07-07 NOTE — Telephone Encounter (Signed)
I called and informed the patient's mom that the provider stated that she could take a multivitamin but no diabetes packet, she understood.  Janique Hoefer,cma

## 2019-07-07 NOTE — Telephone Encounter (Signed)
Mother would like to know if she can give patient everyday or every other day Amanda Snyder Made daily diabetes packet? Ewell Benassi,cma

## 2019-07-08 ENCOUNTER — Other Ambulatory Visit: Payer: Self-pay

## 2019-07-08 ENCOUNTER — Encounter: Payer: Self-pay | Admitting: Family Medicine

## 2019-07-08 MED ORDER — DEXCOM G6 RECEIVER DEVI: 1.0000 | Freq: Four times a day (QID) | 0 refills | Status: DC

## 2019-07-08 MED ORDER — DEXCOM G6 SENSOR MISC: 1.0000 | 3 refills | Status: DC

## 2019-07-08 MED ORDER — DEXCOM G6 TRANSMITTER MISC: 1.0000 | Freq: Four times a day (QID) | 3 refills | Status: DC

## 2019-07-08 NOTE — Telephone Encounter (Signed)
Pt's mother is calling and said she is fed up with [facility]. She wants to know [facility]. The prescription is for Dexcom. She said she has been battling this since last week and is not getting anywhere.

## 2019-07-08 NOTE — Telephone Encounter (Signed)
It looks like [facility]. I suspect this will not make a difference, though we can try.

## 2019-07-10 ENCOUNTER — Encounter: Payer: Self-pay | Admitting: Family Medicine

## 2019-07-12 ENCOUNTER — Ambulatory Visit: Payer: Medicare Other | Admitting: Registered"

## 2019-07-12 ENCOUNTER — Encounter: Payer: Medicare Other | Attending: Family Medicine | Admitting: Registered"

## 2019-07-12 DIAGNOSIS — F909 Attention-deficit hyperactivity disorder, unspecified type: Secondary | ICD-10-CM | POA: Diagnosis not present

## 2019-07-12 DIAGNOSIS — F72 Severe intellectual disabilities: Secondary | ICD-10-CM | POA: Diagnosis not present

## 2019-07-12 DIAGNOSIS — K760 Fatty (change of) liver, not elsewhere classified: Secondary | ICD-10-CM | POA: Diagnosis not present

## 2019-07-12 DIAGNOSIS — Z7984 Long term (current) use of oral hypoglycemic drugs: Secondary | ICD-10-CM

## 2019-07-12 DIAGNOSIS — I10 Essential (primary) hypertension: Secondary | ICD-10-CM | POA: Diagnosis not present

## 2019-07-12 DIAGNOSIS — G40909 Epilepsy, unspecified, not intractable, without status epilepticus: Secondary | ICD-10-CM | POA: Diagnosis not present

## 2019-07-12 DIAGNOSIS — E1165 Type 2 diabetes mellitus with hyperglycemia: Secondary | ICD-10-CM | POA: Diagnosis not present

## 2019-07-12 DIAGNOSIS — E119 Type 2 diabetes mellitus without complications: Secondary | ICD-10-CM | POA: Diagnosis not present

## 2019-07-12 DIAGNOSIS — R000 Tachycardia, unspecified: Secondary | ICD-10-CM | POA: Diagnosis not present

## 2019-07-12 DIAGNOSIS — D509 Iron deficiency anemia, unspecified: Secondary | ICD-10-CM | POA: Diagnosis not present

## 2019-07-12 DIAGNOSIS — E876 Hypokalemia: Secondary | ICD-10-CM | POA: Diagnosis not present

## 2019-07-12 DIAGNOSIS — E871 Hypo-osmolality and hyponatremia: Secondary | ICD-10-CM | POA: Diagnosis not present

## 2019-07-12 DIAGNOSIS — Z6841 Body Mass Index (BMI) 40.0 and over, adult: Secondary | ICD-10-CM

## 2019-07-12 MED ORDER — TRULICITY 0.75 MG/0.5ML ~~LOC~~ SOAJ: 0.7500 mg | SUBCUTANEOUS | 1 refills | Status: DC

## 2019-07-12 NOTE — Addendum Note (Signed)
Addended by: Leone Haven on: 07/12/2019 04:45 PM   Modules accepted: Orders

## 2019-07-13 ENCOUNTER — Encounter: Payer: Self-pay | Admitting: Registered"

## 2019-07-13 NOTE — Patient Instructions (Signed)
Continue with dietary changes including monitoring carb intake and increasing vegetable intake.  Continue with Lantus dose that keeps blood sugar down, but not too low. Follow up with PCP and endocrinologist. After visit with endocrinologist, consider follow-up with RD

## 2019-07-13 NOTE — Progress Notes (Signed)
Diabetes Self-Management Education  Visit Type: First/Initial -  was scheduled as in-person but patient's mother could not get her to get out of car. Switched to MyChart visit to start visit, but RD went out to car to get some more information from patient's mother.  Appt. Start Time: 11:30 Appt. End Time: 12:30  07/13/2019  Ms. Marcello Fennel, identified by name and date of birth, is a 24 y.o. female with a diagnosis of Diabetes: Type 2.   ASSESSMENT  Last menstrual period 06/14/2019. There is no height or weight on file to calculate BMI.   Patient does not communicate much verbally. Patient waived "hello" to RD. Patient's mother states when she was told her daughter had pre-diabetes several years ago they did not make changes to diet, and during the pandemic the family had increased sweets intake. Patient's mother states after her daughters hospital admission it scared her and she immediately learned how to count carbs and change their diet.   Patient was discharged with Lantus and sliding scale Novolog. Per food/blood sugar log her numbers have come within range and last few days has not needed mealtime insulin. Patients mother had reduced Lantus to 30 units due to fear that if she gave 35 units it would drop her sugar too low. Patient's blood sugar is close to within normal blood sugar ranges and RD reassured patient's mother that she was okay having reduced to 30 units especially since she had a MD visit scheduled in a couple of days and endocrinologist appointment later this month.   Patient's mother reports patient has lost 5 lbs with dietary changes over the last month.  Follow-up visit encouraged after patient sees endocrinologist.   Diabetes Self-Management Education - 07/13/19 1548      Visit Information   Visit Type  First/Initial      Initial Visit   Diabetes Type  Type 2    Are you currently following a meal plan?  Yes    What type of meal plan do you follow?  carb  counting    Are you taking your medications as prescribed?  No   reduced insulin due to reduced blood sugar   Date Diagnosed  2021      Psychosocial Assessment   Self-care barriers  Other (comment)   severe intellectual disability (from prob list)   Self-management support  Family    Special Needs  Instruct caregiver      Complications   Last HgB A1C per patient/outside source  9.9 %   hospital encounter last month     Dietary Intake   Breakfast  yogurt, eggs, toast    Snack (morning)  fruit    Lunch  salad, chicken    Snack (afternoon)  peanut butter crackers    Dinner  stroganoff    Snack (evening)  popcorn OR jello    Beverage(s)  water, sugar free beverages      Patient Education   Previous Diabetes Education  Yes (please comment)   a few years ago for pre-diabetes   Medications  Reviewed patients medication for diabetes, action, purpose, timing of dose and side effects.    Monitoring  Identified appropriate SMBG and/or A1C goals.      Individualized Goals (developed by patient)   Nutrition  General guidelines for healthy choices and portions discussed    Medications  Other (comment)   continue with current plan     Outcomes   Expected Outcomes  Demonstrated interest in learning. Expect positive  outcomes    Future DMSE  PRN    Program Status  Not Completed       Individualized Plan for Diabetes Self-Management Training:   Learning Objective:  Patient will have a greater understanding of diabetes self-management. Patient education plan is to attend individual and/or group sessions per assessed needs and concerns.    Patient Instructions  Continue with dietary changes including monitoring carb intake and increasing vegetable intake.  Continue with Lantus dose that keeps blood sugar down, but not too low. Follow up with PCP and endocrinologist. After visit with endocrinologist, consider follow-up with RD   Expected Outcomes:  Demonstrated interest in learning.  Expect positive outcomes  Education material provided: none  If problems or questions, patient to contact team via:  Phone and MyChart  Future DSME appointment: PRN

## 2019-07-14 ENCOUNTER — Other Ambulatory Visit: Payer: Self-pay

## 2019-07-14 ENCOUNTER — Encounter: Payer: Self-pay | Admitting: Family Medicine

## 2019-07-14 ENCOUNTER — Telehealth: Payer: Self-pay | Admitting: Internal Medicine

## 2019-07-14 ENCOUNTER — Telehealth (INDEPENDENT_AMBULATORY_CARE_PROVIDER_SITE_OTHER): Payer: Medicare Other | Admitting: Family Medicine

## 2019-07-14 DIAGNOSIS — E119 Type 2 diabetes mellitus without complications: Secondary | ICD-10-CM

## 2019-07-14 DIAGNOSIS — R000 Tachycardia, unspecified: Secondary | ICD-10-CM

## 2019-07-14 DIAGNOSIS — I10 Essential (primary) hypertension: Secondary | ICD-10-CM

## 2019-07-14 DIAGNOSIS — G4733 Obstructive sleep apnea (adult) (pediatric): Secondary | ICD-10-CM

## 2019-07-14 NOTE — Progress Notes (Signed)
Virtual Visit via video Note  This visit type was conducted due to national recommendations for restrictions regarding the COVID-19 pandemic (e.g. social distancing).  This format is felt to be most appropriate for this patient at this time.  All issues noted in this document were discussed and addressed.  No physical exam was performed (except for noted visual exam findings with Video Visits).   I connected with Marcello Fennel today at  3:15 PM EDT by a video enabled telemedicine application and verified that I am speaking with the correct person using two identifiers. Location patient: home Location provider: work Persons participating in the virtual visit: patient, provider, Everlina Gotts  I discussed the limitations, risks, security and privacy concerns of performing an evaluation and management service by telephone and the availability of in person appointments. I also discussed with the patient that there may be a patient responsible charge related to this service. The patient expressed understanding and agreed to proceed.  Reason for visit: f/u.  HPI: OSA: Patient is unable to tolerate a CPAP given her disability.  She sleeps much better with the head of her bed elevated.  Some hypersomnia.  Her mother is unsure if she wakes well rested.  Diabetes: Patient sugars off of insulin have been 90, 99, and 119 today.  The 119 was postprandial.  Her last dose of insulin was 4/5 at 10:51 AM.  Her urine output is normal.  Tachycardia/hypertension: Blood pressures been elevated at home.  Ranges have been between 916 and 945 systolically and 03-888 diastolically.  Heart rates have ranged between 73 and 102.   ROS: See pertinent positives and negatives per HPI.  Past Medical History:  Diagnosis Date  . ADHD (attention deficit hyperactivity disorder) 2000   mom opted not to give medications (failed Adderall - 'zombielike', failed Strattera - 'no difference')  . Allergy    suspected  allergy to pollen or dog(s)  . Anemia   . Diabetes mellitus without complication ([place])   . Hypertension   . Mental retardation 1997   Severe MR. did not walk until age 8-3 years, received Early Intervention.  . Nondisplaced fracture of proximal phalanx of right great toe, initial encounter for closed fracture 10/06/2017  . Obesity 12  . Premature birth with gestation of 35-36 weeks    [redacted] weeks GA  . Seizures ([place]) From age 64 month to 6 months   took Phenobarbital for a few years. weaned off after moving from [place] to [place]. Unknown etiology.  . Syncope 2006   witnessed by sister, no workup    Past Surgical History:  Procedure Laterality Date  . NO PAST SURGERIES      Family History  Problem Relation Age of Onset  . Stroke Mother 73  . Psoriasis Mother   . Alcohol abuse Mother   . Hyperlipidemia Mother   . Mental retardation Mother   . Hypertension Mother   . Alcohol abuse Brother   . Other Father        Died from gunshot wound.  . Cancer Maternal Grandmother 48       breast ca  . Breast cancer Maternal Grandmother   . Alcohol abuse Maternal Grandfather   . Diabetes Paternal Grandmother     SOCIAL HX: Non-smoker   Current Outpatient Medications:  .  blood glucose meter kit and supplies KIT, Dispense one touch ultra. Use 3 times daily as directed. (FOR ICD-10 E11.9)., Disp: 1 each, Rfl: 0 .  cetirizine (ZYRTEC) 10 MG  tablet, Take 1 tablet (10 mg total) by mouth daily. (Patient taking differently: Take 10 mg by mouth as needed for allergies. ), Disp: 30 tablet, Rfl: 11 .  Dulaglutide (TRULICITY) 1.74 YC/1.4GY SOPN, Inject 0.75 mg into the skin once a week., Disp: 12 pen, Rfl: 1 .  EPINEPHrine (EPIPEN 2-PAK) 0.3 mg/0.3 mL IJ SOAJ injection, Inject 0.3 mLs (0.3 mg total) into the muscle once as needed (anaphylaxis)., Disp: 1 Device, Rfl: 0 .  glucose blood test strip, Dispense one touch ultra. Use 3 times daily as directed. (FOR ICD-10 E11.9)., Disp: 100 each, Rfl: 12 .  ibuprofen  (ADVIL) 400 MG tablet, Take 400 mg by mouth as needed for mild pain or moderate pain. , Disp: , Rfl:  .  Lancets (ONETOUCH ULTRASOFT) lancets, Use 3 times daily as directed. (FOR ICD-10 E11.9)., Disp: 100 each, Rfl: 12 .  metFORMIN (GLUCOPHAGE) 850 MG tablet, Take 1 tablet (850 mg total) by mouth 2 (two) times daily with a meal., Disp: 180 tablet, Rfl: 1 .  metoprolol tartrate (LOPRESSOR) 50 MG tablet, Take 1 tablet (50 mg total) by mouth 2 (two) times daily., Disp: 60 tablet, Rfl: 0 .  NON FORMULARY, Take 1 tablet by mouth as needed (constipation). Stool softner , Disp: , Rfl:  .  ondansetron (ZOFRAN) 4 MG tablet, Take 1 tablet (4 mg total) by mouth every 6 (six) hours as needed for nausea., Disp: 20 tablet, Rfl: 0 .  polyethylene glycol (MIRALAX / GLYCOLAX) 17 g packet, Take 17 g by mouth daily as needed., Disp: 14 each, Rfl: 0 .  senna-docusate (SENOKOT-S) 8.6-50 MG tablet, Take 1 tablet by mouth at bedtime., Disp: 30 tablet, Rfl: 0  EXAM:  VITALS per patient if applicable:  GENERAL: alert, oriented, appears well and in no acute distress  HEENT: atraumatic, conjunttiva clear, no obvious abnormalities on inspection of external nose and ears  NECK: normal movements of the head and neck  LUNGS: on inspection no signs of respiratory distress, breathing rate appears normal, no obvious gross SOB, gasping or wheezing  CV: no obvious cyanosis  MS: moves all visible extremities without noticeable abnormality  PSYCH/NEURO: pleasant and cooperative, no obvious depression or anxiety, speech and thought processing grossly intact  ASSESSMENT AND PLAN:  Discussed the following assessment and plan:  OSA (obstructive sleep apnea) Unable to tolerate CPAP relating to severe intellectual disability.  We will get her hospital bed where they can elevate the head of the bed as this has been beneficial for her breathing previously.  Type 2 diabetes mellitus without complication, without long-term  current use of insulin ([place]) Patient has been off of insulin for greater than 48 hours.  Her sugars are in the normal range or very well controlled just on Metformin.  At this time we will hold off on starting Trulicity.  I will confer with our clinical pharmacist regarding this as well.  Hypertension Consistently elevated at home.  I suspect sleep apnea would be contributing.  She will continue metoprolol.  Will refer to cardiology to consider secondary work-up if needed.  Tachycardia Does still have some tachycardia despite being on metoprolol.  We will refer to cardiology to consider further evaluation.   Orders Placed This Encounter  Procedures  . Ambulatory referral to Cardiology    Referral Priority:   Routine    Referral Type:   Consultation    Referral Reason:   Specialty Services Required    Requested Specialty:   Cardiology    Number of  Visits Requested:   1    No orders of the defined types were placed in this encounter.    I discussed the assessment and treatment plan with the patient. The patient was provided an opportunity to ask questions and all were answered. The patient agreed with the plan and demonstrated an understanding of the instructions.   The patient was advised to call back or seek an in-person evaluation if the symptoms worsen or if the condition fails to improve as anticipated.   [name], MD

## 2019-07-14 NOTE — Assessment & Plan Note (Signed)
Unable to tolerate CPAP relating to severe intellectual disability.  We will get her hospital bed where they can elevate the head of the bed as this has been beneficial for her breathing previously.

## 2019-07-14 NOTE — Telephone Encounter (Signed)
Spoke to patients mother in regards to scheduling a referral that came in. We have her daughter scheduled for 07/29/19 with Dr. Margaretann Loveless. Her mother states that sometimes her daughter does not want to get out of the car, this has been an issue before and the appointment ends up being virtual. She wants to make sure we are aware of this and that this will be okay. She really wants to come into the office but wanted to make Dr. Margaretann Loveless and her nurse aware of the circumstances.

## 2019-07-14 NOTE — Assessment & Plan Note (Signed)
Does still have some tachycardia despite being on metoprolol.  We will refer to cardiology to consider further evaluation.

## 2019-07-14 NOTE — Assessment & Plan Note (Signed)
Consistently elevated at home.  I suspect sleep apnea would be contributing.  She will continue metoprolol.  Will refer to cardiology to consider secondary work-up if needed.

## 2019-07-14 NOTE — Assessment & Plan Note (Signed)
Patient has been off of insulin for greater than 48 hours.  Her sugars are in the normal range or very well controlled just on Metformin.  At this time we will hold off on starting Trulicity.  I will confer with our clinical pharmacist regarding this as well.

## 2019-07-15 ENCOUNTER — Encounter: Payer: Self-pay | Admitting: Family Medicine

## 2019-07-15 ENCOUNTER — Telehealth: Payer: Self-pay | Admitting: *Deleted

## 2019-07-15 DIAGNOSIS — I10 Essential (primary) hypertension: Secondary | ICD-10-CM

## 2019-07-15 DIAGNOSIS — F72 Severe intellectual disabilities: Secondary | ICD-10-CM | POA: Diagnosis not present

## 2019-07-15 DIAGNOSIS — D509 Iron deficiency anemia, unspecified: Secondary | ICD-10-CM | POA: Diagnosis not present

## 2019-07-15 DIAGNOSIS — R000 Tachycardia, unspecified: Secondary | ICD-10-CM | POA: Diagnosis not present

## 2019-07-15 DIAGNOSIS — E1165 Type 2 diabetes mellitus with hyperglycemia: Secondary | ICD-10-CM | POA: Diagnosis not present

## 2019-07-15 DIAGNOSIS — K760 Fatty (change of) liver, not elsewhere classified: Secondary | ICD-10-CM | POA: Diagnosis not present

## 2019-07-15 MED ORDER — METOPROLOL TARTRATE 50 MG PO TABS: 50.0000 mg | ORAL_TABLET | Freq: Two times a day (BID) | ORAL | 0 refills | Status: DC

## 2019-07-15 NOTE — Telephone Encounter (Signed)
Called patient DPR for preference on medical supply , she stated she had no preference so order has been faxed to [facility] medical supply

## 2019-07-15 NOTE — Telephone Encounter (Signed)
-----   Message from Leone Haven, MD sent at 07/14/2019  3:28 PM EDT ----- I have completed this patients note for the hospital bed. How do we get this ordered for her?

## 2019-07-22 ENCOUNTER — Telehealth: Payer: Self-pay | Admitting: Family Medicine

## 2019-07-22 DIAGNOSIS — Z6841 Body Mass Index (BMI) 40.0 and over, adult: Secondary | ICD-10-CM | POA: Diagnosis not present

## 2019-07-22 DIAGNOSIS — E876 Hypokalemia: Secondary | ICD-10-CM | POA: Diagnosis not present

## 2019-07-22 DIAGNOSIS — G40909 Epilepsy, unspecified, not intractable, without status epilepticus: Secondary | ICD-10-CM | POA: Diagnosis not present

## 2019-07-22 DIAGNOSIS — R000 Tachycardia, unspecified: Secondary | ICD-10-CM | POA: Diagnosis not present

## 2019-07-22 DIAGNOSIS — K760 Fatty (change of) liver, not elsewhere classified: Secondary | ICD-10-CM | POA: Diagnosis not present

## 2019-07-22 DIAGNOSIS — Z7984 Long term (current) use of oral hypoglycemic drugs: Secondary | ICD-10-CM | POA: Diagnosis not present

## 2019-07-22 DIAGNOSIS — F909 Attention-deficit hyperactivity disorder, unspecified type: Secondary | ICD-10-CM | POA: Diagnosis not present

## 2019-07-22 DIAGNOSIS — E871 Hypo-osmolality and hyponatremia: Secondary | ICD-10-CM | POA: Diagnosis not present

## 2019-07-22 DIAGNOSIS — I10 Essential (primary) hypertension: Secondary | ICD-10-CM | POA: Diagnosis not present

## 2019-07-22 DIAGNOSIS — F72 Severe intellectual disabilities: Secondary | ICD-10-CM | POA: Diagnosis not present

## 2019-07-22 DIAGNOSIS — D509 Iron deficiency anemia, unspecified: Secondary | ICD-10-CM | POA: Diagnosis not present

## 2019-07-22 DIAGNOSIS — E1165 Type 2 diabetes mellitus with hyperglycemia: Secondary | ICD-10-CM | POA: Diagnosis not present

## 2019-07-22 NOTE — Telephone Encounter (Signed)
Please follow-up with the patient's mother.  Have we been able to get Trulicity approved for her?

## 2019-07-22 NOTE — Telephone Encounter (Signed)
No I could not get it approved, I do have the denial sheet with the appeal form that we could fill out and try.  Brizeyda Holtmeyer,cma

## 2019-07-22 NOTE — Telephone Encounter (Signed)
-----   Message from De Hollingshead, [facility] sent at 07/14/2019  3:37 PM EDT ----- Hmm. This is very tricky. I'm concerned, since her A1c was elevated, that she is going to need something in addition to metformin. However, we still don't understand whatever acute process could have caused the significant elevation in sugars.   I'm torn. GLP1 is not going to cause low blood sugars, and could help w/ weight loss (definitely a more useful option than Januvia). I'm trying to think through if there is any reason that the A1c >9% was falsely elevated? I can't think of anything  I'm glad she's seeing endocrinology. Do you think Dr. Kelton Pillar would give some preliminary input if you message her? She's been really responsive to me with patients in the past.   Bottom line, I think stopping and monitoring, or continuing (maybe they have been more adherent with diet through all of this than they normally would be) and monitoring are appropriate options. Glad she's going to see endo though.  Catie  ----- Message ----- From: Leone Haven, MD Sent: 07/14/2019   3:30 PM EDT To: De Hollingshead, [place] Catie,   We discussed this patient the other day and decided on starting trulicity. She has been off of insulin for >48 hours and her glucose is still well controlled ranging from 90-119 today. Do you think we even need to start trulicity at this time? I still don't have a cause for why her glucose went up so much and is now well controlled.   Randall Hiss

## 2019-07-23 ENCOUNTER — Telehealth: Payer: Self-pay

## 2019-07-23 DIAGNOSIS — I10 Essential (primary) hypertension: Secondary | ICD-10-CM | POA: Diagnosis not present

## 2019-07-23 DIAGNOSIS — K760 Fatty (change of) liver, not elsewhere classified: Secondary | ICD-10-CM | POA: Diagnosis not present

## 2019-07-23 DIAGNOSIS — D509 Iron deficiency anemia, unspecified: Secondary | ICD-10-CM | POA: Diagnosis not present

## 2019-07-23 DIAGNOSIS — E1165 Type 2 diabetes mellitus with hyperglycemia: Secondary | ICD-10-CM | POA: Diagnosis not present

## 2019-07-23 DIAGNOSIS — R000 Tachycardia, unspecified: Secondary | ICD-10-CM | POA: Diagnosis not present

## 2019-07-23 DIAGNOSIS — F72 Severe intellectual disabilities: Secondary | ICD-10-CM | POA: Diagnosis not present

## 2019-07-23 NOTE — Telephone Encounter (Signed)
I called and left a message on VM that the trulicity was approved and should be ready for pickup at [facility].  Fatoumata Albaugh,cma

## 2019-07-23 NOTE — Telephone Encounter (Signed)
Lets try to complete the appeal.

## 2019-07-23 NOTE — Telephone Encounter (Signed)
Sorry, this is for the trulicity.

## 2019-07-23 NOTE — Telephone Encounter (Signed)
Amanda Snyder got approved and is ready for pickup nad I called and left a VM informing the patients mom that she should go to pharmacy [facility]  for medication. Amanda Snyder,cma

## 2019-07-23 NOTE — Telephone Encounter (Signed)
Appeal form is in sign basket for you review and signature.  Jacksen Isip,cma

## 2019-07-24 NOTE — Telephone Encounter (Signed)
We will work with the patient to do what she is most comfortable with, that will not be a problem. Thanks.

## 2019-07-28 ENCOUNTER — Telehealth: Payer: Self-pay

## 2019-07-28 DIAGNOSIS — D509 Iron deficiency anemia, unspecified: Secondary | ICD-10-CM | POA: Diagnosis not present

## 2019-07-28 DIAGNOSIS — E1165 Type 2 diabetes mellitus with hyperglycemia: Secondary | ICD-10-CM | POA: Diagnosis not present

## 2019-07-28 DIAGNOSIS — K760 Fatty (change of) liver, not elsewhere classified: Secondary | ICD-10-CM | POA: Diagnosis not present

## 2019-07-28 DIAGNOSIS — I10 Essential (primary) hypertension: Secondary | ICD-10-CM | POA: Diagnosis not present

## 2019-07-28 DIAGNOSIS — F72 Severe intellectual disabilities: Secondary | ICD-10-CM | POA: Diagnosis not present

## 2019-07-28 DIAGNOSIS — R000 Tachycardia, unspecified: Secondary | ICD-10-CM | POA: Diagnosis not present

## 2019-07-28 NOTE — Telephone Encounter (Signed)
Final request from [facility] informing us to send 6 months of notes for the continuous glucose monitor. I faxed the notes to [employer] today. Confirmation given.  Sharilynn Cassity,cma

## 2019-07-29 ENCOUNTER — Encounter: Payer: Self-pay | Admitting: Internal Medicine

## 2019-07-29 ENCOUNTER — Other Ambulatory Visit: Payer: Self-pay

## 2019-07-29 ENCOUNTER — Ambulatory Visit (INDEPENDENT_AMBULATORY_CARE_PROVIDER_SITE_OTHER): Payer: Medicare Other | Admitting: Internal Medicine

## 2019-07-29 VITALS — BP 126/88 | HR 117 | Ht 62.0 in | Wt 283.0 lb

## 2019-07-29 DIAGNOSIS — R000 Tachycardia, unspecified: Secondary | ICD-10-CM

## 2019-07-29 DIAGNOSIS — R030 Elevated blood-pressure reading, without diagnosis of hypertension: Secondary | ICD-10-CM

## 2019-07-29 NOTE — Progress Notes (Signed)
Cardiology Office Note:    Date:  07/29/2019   ID:  Amanda Snyder, DOB 1995/05/08, MRN [id number]  PCP:  Leone Haven, MD  Cardiologist:  Elouise Munroe, MD  Electrophysiologist:  None   Referring MD: Leone Haven, MD   Chief Complaint: Tachycardia and hypertension   History of Present Illness:    Amanda Snyder is a 24 y.o. female with a history of developmental delay, premature birth at 52 weeks, seizures, syncope, hypertension, diabetes, obesity, sleep apnea unable to tolerate CPAP who presents today for evaluation of tachycardia and hypertension noted by her mother.  Her mother is present for the visit today.  Amanda Snyder goes by the nickname pronounced "Amanda Snyder".  Her mother described for her primary care physician episodic hypertensive blood pressure readings as well as tachycardia.  There is no clear exacerbating factors that her mother can identify.  The patient does not seem to be in distress with these elevated readings.  She did however have episodes where she "slid down the wall and looked like she was going to pass out" without true syncope.  Blood pressure readings at these times are slightly low, but do not appear to be elevated.  She does have a diagnosis of diabetes and we have both question whether her blood sugar was normal at these times.  She did placed on metoprolol for tachycardia by her primary care physician.  She is otherwise not on antihypertensive therapy.  Her mother and I discussed her EKG today which has an anterior infarct pattern and sinus tachycardia.  We discussed that anterior infarct pattern can be seen frequently on ECGs without definite wall motion abnormalities on echo.  I described for her mother that in the absence of chest pain which the patient is not describing, the next best test would be an echocardiogram.  This may be somewhat challenging to complete for diagnostic purposes given the patient's developmental delay and constant  movement, but I have offered to her mother that we can consider this if she would like.   Past Medical History:  Diagnosis Date  . ADHD (attention deficit hyperactivity disorder) 2000   mom opted not to give medications (failed Adderall - 'zombielike', failed Strattera - 'no difference')  . Allergy    suspected allergy to pollen or dog(s)  . Anemia   . Diabetes mellitus without complication ([facility])   . Hypertension   . Mental retardation 1997   Severe MR. did not walk until age 33-3 years, received Early Intervention.  . Nondisplaced fracture of proximal phalanx of right great toe, initial encounter for closed fracture 10/06/2017  . Obesity 12  . Premature birth with gestation of 35-36 weeks    [redacted] weeks GA  . Seizures ([place]) From age 31 month to 6 months   took Phenobarbital for a few years. weaned off after moving from [place] to [place]. Unknown etiology.  . Syncope 2006   witnessed by sister, no workup    Past Surgical History:  Procedure Laterality Date  . NO PAST SURGERIES      Current Medications: Current Meds  Medication Sig  . blood glucose meter kit and supplies KIT Dispense one touch ultra. Use 3 times daily as directed. (FOR ICD-10 E11.9).  . cetirizine (ZYRTEC) 10 MG tablet Take 1 tablet (10 mg total) by mouth daily. (Patient taking differently: Take 10 mg by mouth as needed for allergies. )  . EPINEPHrine (EPIPEN 2-PAK) 0.3 mg/0.3 mL IJ SOAJ injection Inject 0.3 mLs (0.3 mg total)  into the muscle once as needed (anaphylaxis).  Marland Kitchen glucose blood test strip Dispense one touch ultra. Use 3 times daily as directed. (FOR ICD-10 E11.9).  Marland Kitchen ibuprofen (ADVIL) 400 MG tablet Take 400 mg by mouth as needed for mild pain or moderate pain.   . metFORMIN (GLUCOPHAGE) 850 MG tablet Take 1 tablet (850 mg total) by mouth 2 (two) times daily with a meal.  . metoprolol tartrate (LOPRESSOR) 50 MG tablet Take 1 tablet (50 mg total) by mouth 2 (two) times daily.  . NON FORMULARY Take 1 tablet by mouth as  needed (constipation). Stool softner   . ondansetron (ZOFRAN) 4 MG tablet Take 1 tablet (4 mg total) by mouth every 6 (six) hours as needed for nausea.  . polyethylene glycol (MIRALAX / GLYCOLAX) 17 g packet Take 17 g by mouth daily as needed.  . senna-docusate (SENOKOT-S) 8.6-50 MG tablet Take 1 tablet by mouth at bedtime.     Allergies:   Clams [shellfish allergy] and Other   Social History   Socioeconomic History  . Marital status: Single    Spouse name: Not on file  . Number of children: 0  . Years of education: Not on file  . Highest education level: Not on file  Occupational History  . Occupation: Unemployed  Tobacco Use  . Smoking status: Never Smoker  . Smokeless tobacco: Never Used  Substance and Sexual Activity  . Alcohol use: No    Alcohol/week: 0.0 standard drinks  . Drug use: No  . Sexual activity: Not on file  Other Topics Concern  . Not on file  Social History Narrative   Lives at home with mom and youngest sister (2 years younger).      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 Has IEP. She will transition into a day program in June.      Mother maintains guardianship (beyond 79 years of age).      Right-handed.      1-2 caffeine drinks per week.      Social Determinants of Health   Financial Resource Strain:   . Difficulty of Paying Living Expenses:   Food Insecurity:   . Worried About Charity fundraiser in the Last Year:   . Arboriculturist in the Last Year:   Transportation Needs:   . Film/video editor (Medical):   Marland Kitchen Lack of Transportation (Non-Medical):   Physical Activity:   . Days of Exercise per Week:   . Minutes of Exercise per Session:   Stress:   . Feeling of Stress :   Social Connections:   . Frequency of Communication with Friends and Family:   . Frequency of Social Gatherings with Friends and Family:   .  Attends Religious Services:   . Active Member of Clubs or Organizations:   . Attends Archivist Meetings:   Marland Kitchen Marital Status:      Family History: The patient's family history includes Alcohol abuse in her brother, maternal grandfather, and mother; Breast cancer in her maternal grandmother; Cancer (age of onset: 44) in her maternal grandmother; Diabetes in her paternal grandmother; Hyperlipidemia in her mother; Hypertension in her mother; Mental retardation in her mother; Other in her father; Psoriasis in her mother; Stroke (age of onset: 19) in her mother.  ROS:   Unable to be obtained given patient's mental status  EKGs/Labs/Other Studies Reviewed:    The following studies were reviewed today:  EKG: Sinus tachycardia, poor R wave progression  Recent Labs: 06/17/2019: TSH 2.426 06/30/2019: ALT 95; BUN 12; Creatinine, Ser 0.52; Hemoglobin 12.0; Magnesium 1.5; Platelets 401.0; Potassium 4.0; Sodium 135  Recent Lipid Panel    Component Value Date/Time   CHOL 147 01/01/2017 0843   TRIG 181 (H) 01/01/2017 0843   HDL 33 (L) 01/01/2017 0843   CHOLHDL 4.5 01/01/2017 0843   VLDL 28 11/11/2013 1531   LDLCALC 86 01/01/2017 0843    Physical Exam:    VS:  BP 126/88   Pulse (!) 117   Ht 5' 2"  (1.575 m)   Wt 283 lb (128.4 kg)   SpO2 97%   BMI 51.76 kg/m     Wt Readings from Last 5 Encounters:  07/29/19 283 lb (128.4 kg)  07/14/19 281 lb (127.5 kg)  06/25/19 281 lb 6.4 oz (127.6 kg)  06/15/19 275 lb 5.7 oz (124.9 kg)  06/14/19 275 lb 8 oz (125 kg)     GEN: No acute distress, moving arms and rocking in chair. Neck: No JVD appreciated  cardiac:  Tachycardic, regular, no definite murmurs respiratory: Clear to auscultation bilaterally. GI: Soft, nontender, non-distended  MS:  No edema; No deformity. Neuro:   Moves all extremities, otherwise unable to assess Psych: Patient is not able to respond to my questions appropriately but does communicate with her mother in a  meaningful way.    ASSESSMENT:    1. Tachycardia   2. Elevated BP without diagnosis of hypertension    PLAN:    Tachycardia - Plan: EKG 12-Lead  Elevated BP without diagnosis of hypertension   The patient has sinus tachycardia and mildly elevated blood pressures.  This may be driven by untreated sleep apnea, frequent movement, and deconditioning.  She has poor R wave progression on her EKG, but and has had no event that is suspicious for ACS.  An assessment for structure and function of the heart can be performed with an echocardiogram.  Her mother and I have discussed frankly that this may be a limited study primarily to assess wall motion and LV and RV function.  It does not appear that the patient has a known history of any congenital heart lesions.  I have had a thorough discussion with our chief sonographer at the [facility] office Lenard Forth regarding how to coordinate her echocardiogram.  She states that if an echocardiogram is ordered she will assist in coordination to that we can obtain the highest quality images for the patient.  Amanda Kaiser, MD [place]  [facility]    Medication Adjustments/Labs and Tests Ordered: Current medicines are reviewed at length with the patient today.  Concerns regarding medicines are outlined above.  No orders of the defined types were placed in this encounter.  No orders of the defined types were placed in this encounter.   Patient Instructions  Medication Instructions:  The current medical regimen is effective;  continue present plan and medications as directed. Please refer to the Current Medication list given to you today.  *If you need a refill on your cardiac medications before your next appointment, please call your pharmacy*  Follow-Up: Your next appointment:  3 month(s) WITH ONE OF APP's  In Person with You may see Elouise Munroe, MD or one of the following Advanced Practice Providers on your designated Care  Team:  Rosaria Ferries, PA-C Jory Sims, DNP, ANP Cadence Kathlen Mody, NP  At [facility], you and your health needs are our priority.  As part of our continuing mission to provide you with exceptional heart care, we have created designated Provider Care Teams.  These Care Teams include your primary Cardiologist (physician) and Advanced Practice Providers (APPs -  Physician Assistants and Nurse Practitioners) who all work together to provide you with the care you need, when you need it.  We recommend signing up for the patient portal called "MyChart".  Sign up information is provided on this After Visit Summary.  MyChart is used to connect with patients for Virtual Visits (Telemedicine).  Patients are able to view lab/test results, encounter notes, upcoming appointments, etc.  Non-urgent messages can be sent to your provider as well.   To learn more about what you can do with MyChart, go to [email or web address].

## 2019-07-29 NOTE — Patient Instructions (Signed)
Medication Instructions:  The current medical regimen is effective;  continue present plan and medications as directed. Please refer to the Current Medication list given to you today.  *If you need a refill on your cardiac medications before your next appointment, please call your pharmacy*  Follow-Up: Your next appointment:  3 month(s) WITH ONE OF APP's  In Person with You may see Elouise Munroe, MD or one of the following Advanced Practice Providers on your designated Care Team:  Rosaria Ferries, PA-C Jory Sims, DNP, ANP Cadence Kathlen Mody, NP   At [facility], you and your health needs are our priority.  As part of our continuing mission to provide you with exceptional heart care, we have created designated Provider Care Teams.  These Care Teams include your primary Cardiologist (physician) and Advanced Practice Providers (APPs -  Physician Assistants and Nurse Practitioners) who all work together to provide you with the care you need, when you need it.  We recommend signing up for the patient portal called "MyChart".  Sign up information is provided on this After Visit Summary.  MyChart is used to connect with patients for Virtual Visits (Telemedicine).  Patients are able to view lab/test results, encounter notes, upcoming appointments, etc.  Non-urgent messages can be sent to your provider as well.   To learn more about what you can do with MyChart, go to [email or web address].

## 2019-08-04 ENCOUNTER — Other Ambulatory Visit: Payer: Self-pay

## 2019-08-04 ENCOUNTER — Ambulatory Visit (INDEPENDENT_AMBULATORY_CARE_PROVIDER_SITE_OTHER): Payer: Medicare Other | Admitting: Internal Medicine

## 2019-08-04 DIAGNOSIS — E119 Type 2 diabetes mellitus without complications: Secondary | ICD-10-CM | POA: Diagnosis not present

## 2019-08-04 NOTE — Progress Notes (Signed)
Virtual Visit via Video Note  I connected with Amanda Snyder on 08/04/19 at 11:10 AM  by a video enabled telemedicine application and verified that I am speaking with the correct person using two identifiers.   I discussed the limitations of evaluation and management by telemedicine and the availability of in person appointments. The patient expressed understanding and agreed to proceed.   -Location of the patient : Car  -Location of the provider : Office -The names of all persons participating in the telemedicine service : Pt , mother and myself             Name: Amanda Snyder  MRN/ DOB: [id number], June 29, 1995   Age/ Sex: 24 y.o., female    PCP: Leone Haven, MD   Reason for Endocrinology Evaluation: Type 2 Diabetes Mellitus     Date of Initial Endocrinology Visit: 08/04/2019     PATIENT IDENTIFIER: Ms. Amanda Snyder is a 24 y.o. female with a past medical history of HTN, T2DM, Fatty liver, OSA and MR . The patient presented for initial endocrinology clinic visit on 08/04/2019 for consultative assistance with her diabetes management.    HPI: The history was obtained from Mother Pt was diagnosed with DM in 2019 but mother admits no lifestyle changes were made until pt's hospitalization in 06/2019 when she presented with a BG of 550 mg/dL. Pt was discharged on Metformin, lantus and Novolog, until she saw her PCP not too long ago and was switched to Metformin and Trulicity . Mother unable to obtain Trulicity due to insurance issues.   Currently checking blood sugars 2 x / day,  before breakfast and bedtime Hypoglycemia episodes : no             Hemoglobin A1c has ranged from 6.2% in 2020, peaking at 9.9% in 2021. Patient required assistance for hypoglycemia: no Patient has required hospitalization within the last 1 year from hyper or hypoglycemia: Yes- for hyperglycemia 3/2021with BG 550 mg/dL    In terms of diet, the patient eats 3 meals a day.    HOME  DIABETES REGIMEN: Metformin 850 mg BID     Statin: no ACE-I/ARB: no   GLUCOSE LOG:  107- 118 mg/dL     PAST HISTORY: Past Medical History:  Past Medical History:  Diagnosis Date  . ADHD (attention deficit hyperactivity disorder) 2000   mom opted not to give medications (failed Adderall - 'zombielike', failed Strattera - 'no difference')  . Allergy    suspected allergy to pollen or dog(s)  . Anemia   . Diabetes mellitus without complication ([place])   . Hypertension   . Mental retardation 1997   Severe MR. did not walk until age 62-3 years, received Early Intervention.  . Nondisplaced fracture of proximal phalanx of right great toe, initial encounter for closed fracture 10/06/2017  . Obesity 12  . Premature birth with gestation of 35-36 weeks    [redacted] weeks GA  . Seizures ([place]) From age 58 month to 6 months   took Phenobarbital for a few years. weaned off after moving from [place] to [place]. Unknown etiology.  . Syncope 2006   witnessed by sister, no workup    Past Surgical History:  Past Surgical History:  Procedure Laterality Date  . NO PAST SURGERIES        Social History:  reports that she has never smoked. She has never used smokeless tobacco. She reports that she does not drink alcohol or use drugs. Family History:  Family History  Problem Relation Age of Onset  . Stroke Mother 55  . Psoriasis Mother   . Alcohol abuse Mother   . Hyperlipidemia Mother   . Mental retardation Mother   . Hypertension Mother   . Alcohol abuse Brother   . Other Father        Died from gunshot wound.  . Cancer Maternal Grandmother 48       breast ca  . Breast cancer Maternal Grandmother   . Alcohol abuse Maternal Grandfather   . Diabetes Paternal Grandmother       HOME MEDICATIONS: Allergies as of 08/04/2019      Reactions   Clams [shellfish Allergy]    And shrimp   Other Other (See Comments)   Eyes and nose running      Medication List       Accurate as of August 04, 2019  7:01  AM. If you have any questions, ask your nurse or doctor.        blood glucose meter kit and supplies Kit Dispense one touch ultra. Use 3 times daily as directed. (FOR ICD-10 E11.9).   cetirizine 10 MG tablet Commonly known as: ZYRTEC Take 1 tablet (10 mg total) by mouth daily. What changed:   when to take this  reasons to take this   EPINEPHrine 0.3 mg/0.3 mL Soaj injection Commonly known as: EpiPen 2-Pak Inject 0.3 mLs (0.3 mg total) into the muscle once as needed (anaphylaxis).   glucose blood test strip Dispense one touch ultra. Use 3 times daily as directed. (FOR ICD-10 E11.9).   ibuprofen 400 MG tablet Commonly known as: ADVIL Take 400 mg by mouth as needed for mild pain or moderate pain.   metFORMIN 850 MG tablet Commonly known as: GLUCOPHAGE Take 1 tablet (850 mg total) by mouth 2 (two) times daily with a meal.   metoprolol tartrate 50 MG tablet Commonly known as: LOPRESSOR Take 1 tablet (50 mg total) by mouth 2 (two) times daily.   NON FORMULARY Take 1 tablet by mouth as needed (constipation). Stool softner   ondansetron 4 MG tablet Commonly known as: ZOFRAN Take 1 tablet (4 mg total) by mouth every 6 (six) hours as needed for nausea.   polyethylene glycol 17 g packet Commonly known as: MIRALAX / GLYCOLAX Take 17 g by mouth daily as needed.   senna-docusate 8.6-50 MG tablet Commonly known as: Senokot-S Take 1 tablet by mouth at bedtime.   Trulicity 2.13 YQ/6.5HQ Sopn Generic drug: Dulaglutide Inject 0.75 mg into the skin once a week.        ALLERGIES: Allergies  Allergen Reactions  . Clams [Shellfish Allergy]     And shrimp  . Other Other (See Comments)    Eyes and nose running      DATA REVIEWED:  Lab Results  Component Value Date   HGBA1C 9.9 (H) 06/14/2019   HGBA1C 6.2 (H) 05/13/2018   HGBA1C 7.6 (H) 09/25/2017   Lab Results  Component Value Date   MICROALBUR 0.9 06/17/2018   LDLCALC 86 01/01/2017   CREATININE 0.52 06/30/2019    Lab Results  Component Value Date   MICRALBCREAT 0.5 06/17/2018    Lab Results  Component Value Date   CHOL 147 01/01/2017   HDL 33 (L) 01/01/2017   LDLCALC 86 01/01/2017   TRIG 181 (H) 01/01/2017   CHOLHDL 4.5 01/01/2017        ASSESSMENT / PLAN / RECOMMENDATIONS:   1) Type 2 Diabetes Mellitus, With improved Glycemic control, Without  complications - Most recent A1c of 9.9 %. Goal A1c < 7.0 %.    Plan: GENERAL: I have discussed with the patient the pathophysiology of diabetes. We went over the natural progression of the disease. We talked about both insulin resistance and insulin deficiency. We stressed the importance of lifestyle changes including diet and exercise. I explained the complications associated with diabetes including retinopathy, nephropathy, neuropathy as well as increased risk of cardiovascular disease. We went over the benefit seen with glycemic control.   I explained to the patient that diabetic patients are at higher than normal risk for amputations.   Mother has done an awesome job in learning how to count carbohydrates and has been providing between 30-60 grams of carbs per meal, BG have been optimal and I have encourage her to continue .   No need for add- on therapy at this time, but she was advised to contact us should her BG's remain consistently over 180 mg/dL   MEDICATIONS: Continue Metformin 850 mg twice daily     EDUCATION / INSTRUCTIONS:  BG monitoring instructions: Patient is instructed to check her blood sugars 1 times a day, fasting   Call [facility] if: BG persistently >180. . I reviewed the Rule of 15 for the treatment of hypoglycemia in detail with the patient. Literature supplied.   2) Diabetic complications:   Eye: Does not have known diabetic retinopathy.   Neuro/ Feet: Does not have known diabetic peripheral neuropathy.  Renal: Patient does not have known baseline CKD.    F/U in 3 months - mother will call     Signed electronically by: Mack Guise, MD  [facility]  [facility] [place] [place] [address] [place], [place] [address] Phone: [phone] FAX: [phone]   CC: Leone Haven, MD [address] [place] [address] Phone: [phone]  Fax: [phone]    Return to Endocrinology clinic as below: Future Appointments  Date Time Provider [place]  08/04/2019 11:10 AM Minaal Struckman, Melanie Crazier, MD [facility] None  10/19/2019 12:00 PM Leone Haven, MD [facility]

## 2019-08-10 ENCOUNTER — Telehealth: Payer: Self-pay | Admitting: Internal Medicine

## 2019-08-10 NOTE — Telephone Encounter (Signed)
New Message  Pt's mother called, she wanted to switch providers from Dr. Margaretann Loveless to Dr. Oval Linsey. She said she is seeing Dr. Oval Linsey and would like to have the same doctor for her daugther  Please advise

## 2019-08-10 NOTE — Telephone Encounter (Signed)
We got a message about this last week and are both ok with the swap. Thanks.

## 2019-08-11 NOTE — Telephone Encounter (Signed)
Yes.  OK with me.

## 2019-08-13 DIAGNOSIS — E1165 Type 2 diabetes mellitus with hyperglycemia: Secondary | ICD-10-CM | POA: Diagnosis not present

## 2019-08-13 DIAGNOSIS — R000 Tachycardia, unspecified: Secondary | ICD-10-CM | POA: Diagnosis not present

## 2019-08-13 DIAGNOSIS — F72 Severe intellectual disabilities: Secondary | ICD-10-CM | POA: Diagnosis not present

## 2019-08-13 DIAGNOSIS — D509 Iron deficiency anemia, unspecified: Secondary | ICD-10-CM | POA: Diagnosis not present

## 2019-08-13 DIAGNOSIS — I10 Essential (primary) hypertension: Secondary | ICD-10-CM | POA: Diagnosis not present

## 2019-08-13 DIAGNOSIS — K760 Fatty (change of) liver, not elsewhere classified: Secondary | ICD-10-CM | POA: Diagnosis not present

## 2019-08-16 ENCOUNTER — Ambulatory Visit (INDEPENDENT_AMBULATORY_CARE_PROVIDER_SITE_OTHER): Payer: Medicare Other | Admitting: Cardiovascular Disease

## 2019-08-16 ENCOUNTER — Other Ambulatory Visit: Payer: Self-pay

## 2019-08-16 ENCOUNTER — Encounter: Payer: Self-pay | Admitting: Cardiovascular Disease

## 2019-08-16 VITALS — BP 100/73 | HR 97 | Ht 62.0 in | Wt 280.0 lb

## 2019-08-16 DIAGNOSIS — I10 Essential (primary) hypertension: Secondary | ICD-10-CM | POA: Diagnosis not present

## 2019-08-16 DIAGNOSIS — R000 Tachycardia, unspecified: Secondary | ICD-10-CM | POA: Diagnosis not present

## 2019-08-16 DIAGNOSIS — R0602 Shortness of breath: Secondary | ICD-10-CM

## 2019-08-16 DIAGNOSIS — G4733 Obstructive sleep apnea (adult) (pediatric): Secondary | ICD-10-CM

## 2019-08-16 HISTORY — DX: Shortness of breath: R06.02

## 2019-08-16 NOTE — Progress Notes (Signed)
Cardiology Office Note   Date:  08/16/2019   ID:  Amanda Snyder, DOB 10-29-1995, MRN [id number]  PCP:  Leone Haven, MD  Cardiologist:   Skeet Latch, MD   No chief complaint on file.  History of Present Illness: Amanda Snyder is a 24 y.o. female with developmental delay, seizures, syncope, DM, obesity, OSA not on CPAP, and hypertension who is being seen today for the evaluation of tachycardia.  She saw her PCP for episodes of hypertensive readings and tachycardia.  Her blood pressure can be as high as the 659D systolic.  Her heart rate was going into the 110s or 120s.  Her mother also reported episodes where she slid down the wall and look like she was going to pass out without syncope.  During these times her blood pressure was low.  S her primary care doctor started her on metoprolol.  She saw Dr. Margaretann Loveless on 4/22.  There was some consideration for ordering an echocardiogram.  Her mother was unsure whether or not she would be able to complete this procedure.  Her mother is my patient and she decided she would want her daughter to see me.  Overall she has been stable.  Her blood pressure continues to go high, though she notices it typically occurs in the setting of when she is upset and yelling.  Otherwise it has been as low as the 35T systolic.  Her heart rate is mostly in the 90s.  Recently it has been as high as the 110s.  Overall she has been well.  They have started trying to walk more.  When she goes to the park she gets very short of breath after even short distances.  Her mother notes that she sometimes gets diaphoretic and clammy.  She is on sure whether she has headaches.  She denies any lower extremity edema, orthopnea, or PND.  Her mother notes that she is able to understand everything that is said but has difficulty expressing herself.    Past Medical History:  Diagnosis Date  . ADHD (attention deficit hyperactivity disorder) 2000   mom opted not to give  medications (failed Adderall - 'zombielike', failed Strattera - 'no difference')  . Allergy    suspected allergy to pollen or dog(s)  . Anemia   . Diabetes mellitus without complication ([place])   . Hypertension   . Mental retardation 1997   Severe MR. did not walk until age 32-3 years, received Early Intervention.  . Nondisplaced fracture of proximal phalanx of right great toe, initial encounter for closed fracture 10/06/2017  . Obesity 12  . Premature birth with gestation of 35-36 weeks    [redacted] weeks GA  . Seizures ([place]) From age 68 month to 6 months   took Phenobarbital for a few years. weaned off after moving from [place] to [place]. Unknown etiology.  . Shortness of breath 08/16/2019  . Syncope 2006   witnessed by sister, no workup    Past Surgical History:  Procedure Laterality Date  . NO PAST SURGERIES       Current Outpatient Medications  Medication Sig Dispense Refill  . blood glucose meter kit and supplies KIT Dispense one touch ultra. Use 3 times daily as directed. (FOR ICD-10 E11.9). 1 each 0  . cetirizine (ZYRTEC) 10 MG tablet Take 1 tablet (10 mg total) by mouth daily. (Patient taking differently: Take 10 mg by mouth as needed for allergies. ) 30 tablet 11  . EPINEPHrine (EPIPEN 2-PAK) 0.3 mg/0.3 mL  IJ SOAJ injection Inject 0.3 mLs (0.3 mg total) into the muscle once as needed (anaphylaxis). 1 Device 0  . glucose blood test strip Dispense one touch ultra. Use 3 times daily as directed. (FOR ICD-10 E11.9). 100 each 12  . ibuprofen (ADVIL) 400 MG tablet Take 400 mg by mouth as needed for mild pain or moderate pain.     . metFORMIN (GLUCOPHAGE) 850 MG tablet Take 1 tablet (850 mg total) by mouth 2 (two) times daily with a meal. 180 tablet 1  . metoprolol tartrate (LOPRESSOR) 50 MG tablet Take 1 tablet (50 mg total) by mouth 2 (two) times daily. 180 tablet 0  . NON FORMULARY Take 1 tablet by mouth as needed (constipation). Stool softner     . ondansetron (ZOFRAN) 4 MG tablet Take 1 tablet (4  mg total) by mouth every 6 (six) hours as needed for nausea. 20 tablet 0  . polyethylene glycol (MIRALAX / GLYCOLAX) 17 g packet Take 17 g by mouth daily as needed. 14 each 0  . senna-docusate (SENOKOT-S) 8.6-50 MG tablet Take 1 tablet by mouth at bedtime. 30 tablet 0   No current facility-administered medications for this visit.    Allergies:   Clams [shellfish allergy] and Other    Social History:  The patient  reports that she has never smoked. She has never used smokeless tobacco. She reports that she does not drink alcohol or use drugs.   Family History:  The patient's family history includes Alcohol abuse in her brother, maternal grandfather, and mother; Breast cancer in her maternal grandmother; Cancer (age of onset: 6) in her maternal grandmother; Diabetes in her paternal grandmother; Hyperlipidemia in her mother; Hypertension in her mother; Mental retardation in her mother; Other in her father; Psoriasis in her mother; Stroke (age of onset: 33) in her mother.    ROS:  Please see the history of present illness.   Otherwise, review of systems are positive for none.   All other systems are reviewed and negative.    PHYSICAL EXAM: VS:  BP 100/73   Pulse 97   Ht 5' 2"  (1.575 m)   Wt 280 lb (127 kg)   SpO2 100%   BMI 51.21 kg/m  , BMI Body mass index is 51.21 kg/m. GENERAL:  Well appearing.  Minimally verbal HEENT:  Pupils equal round and reactive, fundi not visualized, oral mucosa unremarkable NECK:  No jugular venous distention, waveform within normal limits, carotid upstroke brisk and symmetric, no bruits LUNGS:  Clear to auscultation bilaterally HEART:  RRR.  PMI not displaced or sustained,S1 and S2 within normal limits, no S3, no S4, no clicks, no rubs, no murmurs ABD:  Flat, positive bowel sounds normal in frequency in pitch, no bruits, no rebound, no guarding, no midline pulsatile mass, no hepatomegaly, no splenomegaly EXT:  2 plus pulses throughout, no edema, no cyanosis  no clubbing SKIN:  No rashes no nodules NEURO:  Cranial nerves II through XII grossly intact.  Moves all 4 extremities freely. PSYCH:  Unable to assess.      EKG:  EKG is not ordered today. The ekg ordered 06/14/19: Sinus tachycardia.  Rate 111 bpm.  QTc 524 ms.  LAFB.   Recent Labs: 06/17/2019: TSH 2.426 06/30/2019: ALT 95; BUN 12; Creatinine, Ser 0.52; Hemoglobin 12.0; Magnesium 1.5; Platelets 401.0; Potassium 4.0; Sodium 135    Lipid Panel    Component Value Date/Time   CHOL 147 01/01/2017 0843   TRIG 181 (H) 01/01/2017 9678  HDL 33 (L) 01/01/2017 0843   CHOLHDL 4.5 01/01/2017 0843   VLDL 28 11/11/2013 1531   LDLCALC 86 01/01/2017 0843      Wt Readings from Last 3 Encounters:  08/16/19 280 lb (127 kg)  07/29/19 283 lb (128.4 kg)  07/14/19 281 lb (127.5 kg)      ASSESSMENT AND PLAN:  # Sinus tachycardia:  # Essential hypertension:  She continues to have intermittent episodes of tachycardia and hypertension.  We will get a limited echo to assess for systolic dysfunction.  She does not have any heart failure on exam.  Continue metoprolol.  If she has evidence of systolic dysfunction we may need to add ivabradine as her blood pressure does not have much room for further titration of the metoprolol.  Thyroid function has been normal.  We will also check plasma metanephrines to rule out pheochromocytoma given the tachycardia, hypertension, and diaphoresis.  # Obesity:  Encouraged to increase her exercise to 150 minutes weekly.   Current medicines are reviewed at length with the patient today.  The patient does not have concerns regarding medicines.  The following changes have been made:  no change  Labs/ tests ordered today include:   Orders Placed This Encounter  Procedures  . Metanephrines, plasma  . ECHOCARDIOGRAM LIMITED     Disposition:   FU with Rondarius Kadrmas C. Oval Linsey, MD, [facility] in 2 months.     Signed, Shanese Riemenschneider C. Oval Linsey, MD, [facility]  08/16/2019 12:02 PM      [place]

## 2019-08-16 NOTE — Patient Instructions (Addendum)
Medication Instructions:  Your physician recommends that you continue on your current medications as directed. Please refer to the Current Medication list given to you today.   *If you need a refill on your cardiac medications before your next appointment, please call your pharmacy*  Lab Work: METANEPHRINES TODAY   Testing/Procedures: Your physician has requested that you have an echocardiogram. Echocardiography is a painless test that uses sound waves to create images of your heart. It provides your doctor with information about the size and shape of your heart and how well your heart's chambers and valves are working. This procedure takes approximately one hour. There are no restrictions for this procedure. [address]   Follow-Up: At [facility], you and your health needs are our priority.  As part of our continuing mission to provide you with exceptional heart care, we have created designated Provider Care Teams.  These Care Teams include your primary Cardiologist (physician) and Advanced Practice Providers (APPs -  Physician Assistants and Nurse Practitioners) who all work together to provide you with the care you need, when you need it.  We recommend signing up for the patient portal called "MyChart".  Sign up information is provided on this After Visit Summary.  MyChart is used to connect with patients for Virtual Visits (Telemedicine).  Patients are able to view lab/test results, encounter notes, upcoming appointments, etc.  Non-urgent messages can be sent to your provider as well.   To learn more about what you can do with MyChart, go to [email or web address].    Your next appointment:   2 month(s)  The format for your next appointment:   In Person  Provider:   You may see DR [facility] or one of the following Advanced Practice Providers on your designated Care Team:    Kerin Ransom, PA-C  [place], [place]  Coletta Memos, [place]  Other  Instructions BRING FAVORITE OBJECT/MOVIE TO HAVE DURING ECHO

## 2019-08-23 LAB — METANEPHRINES, PLASMA
Metanephrine, Free: 41 pg/mL (ref 0.0–88.0)
Normetanephrine, Free: 84.9 pg/mL (ref 0.0–107.7)

## 2019-09-07 ENCOUNTER — Other Ambulatory Visit: Payer: Self-pay

## 2019-09-07 ENCOUNTER — Ambulatory Visit (HOSPITAL_COMMUNITY): Payer: Medicare Other | Attending: Cardiology

## 2019-09-07 DIAGNOSIS — I10 Essential (primary) hypertension: Secondary | ICD-10-CM | POA: Diagnosis not present

## 2019-09-07 DIAGNOSIS — R000 Tachycardia, unspecified: Secondary | ICD-10-CM | POA: Insufficient documentation

## 2019-09-07 DIAGNOSIS — R0602 Shortness of breath: Secondary | ICD-10-CM | POA: Diagnosis not present

## 2019-10-19 ENCOUNTER — Other Ambulatory Visit: Payer: Self-pay

## 2019-10-19 ENCOUNTER — Ambulatory Visit (INDEPENDENT_AMBULATORY_CARE_PROVIDER_SITE_OTHER): Payer: Medicare Other | Admitting: Family Medicine

## 2019-10-19 ENCOUNTER — Encounter: Payer: Self-pay | Admitting: Family Medicine

## 2019-10-19 VITALS — BP 120/80 | HR 93 | Temp 97.4°F | Ht 62.0 in | Wt 274.8 lb

## 2019-10-19 DIAGNOSIS — M40209 Unspecified kyphosis, site unspecified: Secondary | ICD-10-CM

## 2019-10-19 DIAGNOSIS — L819 Disorder of pigmentation, unspecified: Secondary | ICD-10-CM

## 2019-10-19 DIAGNOSIS — I10 Essential (primary) hypertension: Secondary | ICD-10-CM

## 2019-10-19 DIAGNOSIS — E119 Type 2 diabetes mellitus without complications: Secondary | ICD-10-CM | POA: Diagnosis not present

## 2019-10-19 DIAGNOSIS — F72 Severe intellectual disabilities: Secondary | ICD-10-CM

## 2019-10-19 LAB — POCT URINALYSIS DIPSTICK
Bilirubin, UA: NEGATIVE
Blood, UA: NEGATIVE
Glucose, UA: NEGATIVE
Ketones, UA: NEGATIVE
Leukocytes, UA: NEGATIVE
Nitrite, UA: NEGATIVE
Protein, UA: NEGATIVE
Spec Grav, UA: 1.02 (ref 1.010–1.025)
Urobilinogen, UA: 1 E.U./dL
pH, UA: 6.5 (ref 5.0–8.0)

## 2019-10-19 LAB — POCT GLYCOSYLATED HEMOGLOBIN (HGB A1C): Hemoglobin A1C: 5.6 % (ref 4.0–5.6)

## 2019-10-19 LAB — MICROALBUMIN / CREATININE URINE RATIO
Creatinine,U: 101.9 mg/dL
Microalb Creat Ratio: 0.7 mg/g (ref 0.0–30.0)
Microalb, Ur: <0.7 mg/dL (ref 0.0–1.9)

## 2019-10-19 MED ORDER — METFORMIN HCL 500 MG PO TABS: 500.0000 mg | ORAL_TABLET | Freq: Two times a day (BID) | ORAL | 1 refills | Status: DC

## 2019-10-19 NOTE — Patient Instructions (Signed)
Nice to see you. We are going to decrease her Metformin dose to 500 mg twice daily. Please continue to monitor your sugars.  You can check her blood pressure several times a week.  Let us know if it trends up or down. Please monitor the spot on your right hand.  If it does not resolve in the next week please let us know.

## 2019-10-19 NOTE — Assessment & Plan Note (Signed)
Question related to possible insect bite given single papule.  They will monitor and if not improving they will let us know.

## 2019-10-19 NOTE — Assessment & Plan Note (Signed)
Generally well controlled.  She will continue her metoprolol.  They will monitor her blood pressure several times a week and let us know if it trends up or down.

## 2019-10-19 NOTE — Assessment & Plan Note (Addendum)
Generally very well controlled based on CBGs.  A1c well controlled today.  We will decrease her Metformin dose as outlined below.

## 2019-10-19 NOTE — Progress Notes (Signed)
[name], MD Phone: [phone]  Amanda Snyder is a 24 y.o. female who presents today for follow-up.  Hypertension/tachycardia: She saw cardiology.  She had an echo that was overall reassuring and normal.  Currently on metoprolol.  No chest pain or shortness of breath.  Blood pressures have ranged anywhere from 90-140s/40s-90s though have been more consistently in an adequately controlled range.  Diabetes: CBGs have been running 90s-110s.  Some polyuria.  This occurs during the day and night.  No pain.  Taking Metformin.  No hypoglycemia.  They have been watching her sugar intake.  Not much exercise.  She is due to see ophthalmology.  Hyper pigmented skin lesion: Noted on the right dorsum of her hand over her middle MCP joint.  Her mom noted this today.  She wonders if she got bit by something.  Social History   Tobacco Use  Smoking Status Never Smoker  Smokeless Tobacco Never Used     ROS see history of present illness  Objective  Physical Exam Vitals:   10/19/19 1155  BP: 120/80  Pulse: 93  Temp: (!) 97.4 F (36.3 C)  SpO2: 99%    BP Readings from Last 3 Encounters:  10/19/19 120/80  08/16/19 100/73  07/29/19 126/88   Wt Readings from Last 3 Encounters:  10/19/19 274 lb 12.8 oz (124.6 kg)  08/16/19 280 lb (127 kg)  07/29/19 283 lb (128.4 kg)    Physical Exam Constitutional:      General: She is not in acute distress.    Appearance: She is not diaphoretic.  Cardiovascular:     Rate and Rhythm: Normal rate and regular rhythm.     Heart sounds: Normal heart sounds.  Pulmonary:     Effort: Pulmonary effort is normal.     Breath sounds: Normal breath sounds.  Skin:    General: Skin is warm and dry.     Comments: Slight hyperpigmentation to the skin overlying her middle MCP joint on the dorsal aspect of her right hand, there may be a small papule in that area  Neurological:     Mental Status: She is alert.    Diabetic Foot Exam - Simple   Simple  Foot Form Diabetic Foot exam was performed with the following findings: Yes 10/19/2019 12:11 PM  Visual Inspection No deformities, no ulcerations, no other skin breakdown bilaterally: Yes Sensation Testing See comments: Yes Pulse Check Posterior Tibialis and Dorsalis pulse intact bilaterally: Yes Comments Patient is nonverbal and is unable to complete sensation testing      Assessment/Plan: Please see individual problem list.  Hypertension Generally well controlled.  She will continue her metoprolol.  They will monitor her blood pressure several times a week and let us know if it trends up or down.  Type 2 diabetes mellitus without complication, without long-term current use of insulin ([place]) Generally very well controlled based on CBGs.  A1c well controlled today.  We will decrease her Metformin dose as outlined below.  Hyperpigmented skin lesion Question related to possible insect bite given single papule.  They will monitor and if not improving they will let us know.   Orders Placed This Encounter  Procedures  . Urine Microalbumin w/creat. ratio  . POCT Urinalysis Dipstick  . POCT HgB A1C    Meds ordered this encounter  Medications  . metFORMIN (GLUCOPHAGE) 500 MG tablet    Sig: Take 1 tablet (500 mg total) by mouth 2 (two) times daily with a meal.    Dispense:  180 tablet    Refill:  1    This visit occurred during the SARS-CoV-2 public health emergency.  Safety protocols were in place, including screening questions prior to the visit, additional usage of staff PPE, and extensive cleaning of exam room while observing appropriate contact time as indicated for disinfecting solutions.    [name], MD [place]

## 2019-10-20 NOTE — Progress Notes (Signed)
Dme given and ready for signature.

## 2019-10-20 NOTE — Progress Notes (Signed)
DME was put in sign basket to sign.  Chevie Birkhead,cma

## 2019-10-20 NOTE — Addendum Note (Signed)
Addended by: Nanci Pina on: 10/20/2019 03:56 PM   Modules accepted: Orders

## 2019-10-22 ENCOUNTER — Telehealth: Payer: Self-pay

## 2019-10-22 NOTE — Telephone Encounter (Signed)
I called and spoke with the patient's mother to ask where she would like [place] to send the shower chair prescription, she stated she did not know just a place that will deliver and she wanted a bariatric chair due to the patient weight. I faxed the DME to [employer] with a note to deliver to patient's address  and confirmation was given.  Ryden Wainer,cma

## 2019-11-04 ENCOUNTER — Ambulatory Visit (INDEPENDENT_AMBULATORY_CARE_PROVIDER_SITE_OTHER): Payer: Medicare Other | Admitting: Cardiovascular Disease

## 2019-11-04 ENCOUNTER — Encounter: Payer: Self-pay | Admitting: Cardiovascular Disease

## 2019-11-04 ENCOUNTER — Other Ambulatory Visit: Payer: Self-pay

## 2019-11-04 VITALS — BP 120/70 | HR 76 | Ht 62.0 in | Wt 274.6 lb

## 2019-11-04 DIAGNOSIS — I10 Essential (primary) hypertension: Secondary | ICD-10-CM

## 2019-11-04 DIAGNOSIS — G4733 Obstructive sleep apnea (adult) (pediatric): Secondary | ICD-10-CM | POA: Diagnosis not present

## 2019-11-04 NOTE — Progress Notes (Signed)
Cardiology Office Note   Date:  11/04/2019   ID:  Amanda Snyder, DOB 08/07/1995, MRN [id number]  PCP:  Leone Haven, MD  Cardiologist:   Skeet Latch, MD   No chief complaint on file.  History of Present Illness: Amanda Snyder is a 24 y.o. female with inappropriate sinus tachycardia, developmental delay, seizures, syncope, DM, obesity, OSA not on CPAP, and hypertension here for follow-up.  She was initially seen for the evaluation of tachycardia.  She saw her PCP for episodes of hypertensive readings and tachycardia.  Her blood pressure can be as high as the 771H systolic.  Her heart rate was going into the 110s or 120s.  Her mother also reported episodes where she slid down the wall and look like she was going to pass out but did not.  During these times her blood pressure was low.  Her primary care doctor started her on metoprolol.    Amanda Snyder had an echo 09/2019 that revealed LVEF 60 to 65% with grade 1 diastolic dysfunction and was otherwise normal.  There is concern for pheochromocytoma so she had plasma metanephrines checked which were normal.  Lately she has been feeling well.  Her mom has not been checking her blood pressure as often but when she does her blood pressure has been stable.  Her heart rates are also consistently under 100.  She is getting much formal work.  Her mom does trying to go outside and walk some.  Her blood sugars have been much better lately 2.  She has her hemoglobin A1c down to 5.6%.  She no longer requires insulin and is only on metformin.  Her weight is coming down as well.   Past Medical History:  Diagnosis Date  . ADHD (attention deficit hyperactivity disorder) 2000   mom opted not to give medications (failed Adderall - 'zombielike', failed Strattera - 'no difference')  . Allergy    suspected allergy to pollen or dog(s)  . Anemia   . Diabetes mellitus without complication ([place])   . Hypertension   . Mental retardation 1997   Severe MR.  did not walk until age 35-3 years, received Early Intervention.  . Nondisplaced fracture of proximal phalanx of right great toe, initial encounter for closed fracture 10/06/2017  . Obesity 12  . Premature birth with gestation of 35-36 weeks    [redacted] weeks GA  . Seizures ([place]) From age 8 month to 6 months   took Phenobarbital for a few years. weaned off after moving from [place] to [place]. Unknown etiology.  . Shortness of breath 08/16/2019  . Syncope 2006   witnessed by sister, no workup    Past Surgical History:  Procedure Laterality Date  . NO PAST SURGERIES       Current Outpatient Medications  Medication Sig Dispense Refill  . blood glucose meter kit and supplies KIT Dispense one touch ultra. Use 3 times daily as directed. (FOR ICD-10 E11.9). 1 each 0  . cetirizine (ZYRTEC) 10 MG tablet Take 1 tablet (10 mg total) by mouth daily. (Patient taking differently: Take 10 mg by mouth as needed for allergies. ) 30 tablet 11  . EPINEPHrine (EPIPEN 2-PAK) 0.3 mg/0.3 mL IJ SOAJ injection Inject 0.3 mLs (0.3 mg total) into the muscle once as needed (anaphylaxis). 1 Device 0  . glucose blood test strip Dispense one touch ultra. Use 3 times daily as directed. (FOR ICD-10 E11.9). 100 each 12  . ibuprofen (ADVIL) 400 MG tablet Take 400 mg  by mouth as needed for mild pain or moderate pain.     . metFORMIN (GLUCOPHAGE) 500 MG tablet Take 1 tablet (500 mg total) by mouth 2 (two) times daily with a meal. 180 tablet 1  . NON FORMULARY Take 1 tablet by mouth as needed (constipation). Stool softner     . ondansetron (ZOFRAN) 4 MG tablet Take 1 tablet (4 mg total) by mouth every 6 (six) hours as needed for nausea. 20 tablet 0  . polyethylene glycol (MIRALAX / GLYCOLAX) 17 g packet Take 17 g by mouth daily as needed. 14 each 0  . senna-docusate (SENOKOT-S) 8.6-50 MG tablet Take 1 tablet by mouth at bedtime. 30 tablet 0  . metoprolol tartrate (LOPRESSOR) 50 MG tablet Take 1 tablet (50 mg total) by mouth 2 (two) times  daily. 180 tablet 0   No current facility-administered medications for this visit.    Allergies:   Clams [shellfish allergy] and Other    Social History:  The patient  reports that she has never smoked. She has never used smokeless tobacco. She reports that she does not drink alcohol and does not use drugs.   Family History:  The patient's family history includes Alcohol abuse in her brother, maternal grandfather, and mother; Breast cancer in her maternal grandmother; Cancer (age of onset: 23) in her maternal grandmother; Diabetes in her paternal grandmother; Hyperlipidemia in her mother; Hypertension in her mother; Mental retardation in her mother; Other in her father; Psoriasis in her mother; Stroke (age of onset: 22) in her mother.    ROS:  Please see the history of present illness.   Otherwise, review of systems are positive for none.   All other systems are reviewed and negative.    PHYSICAL EXAM: VS:  BP 120/70   Pulse 76   Ht 5' 2"  (1.575 m)   Wt (!) 274 lb 9.6 oz (124.6 kg)   SpO2 100%   BMI 50.22 kg/m  , BMI Body mass index is 50.22 kg/m. GENERAL:  Well appearing.  Minimally verbal HEENT:  Pupils equal round and reactive, fundi not visualized, oral mucosa unremarkable NECK:  No jugular venous distention, waveform within normal limits, carotid upstroke brisk and symmetric, no bruits LUNGS:  Clear to auscultation bilaterally HEART:  RRR.  PMI not displaced or sustained,S1 and S2 within normal limits, no S3, no S4, no clicks, no rubs, no murmurs ABD:  Flat, positive bowel sounds normal in frequency in pitch, no bruits, no rebound, no guarding, no midline pulsatile mass, no hepatomegaly, no splenomegaly EXT:  2 plus pulses throughout, no edema, no cyanosis no clubbing SKIN:  No rashes no nodules NEURO:  Cranial nerves II through XII grossly intact.  Moves all 4 extremities freely. PSYCH:  Unable to assess.     EKG:  EKG is not ordered today. The ekg ordered 06/14/19: Sinus  tachycardia.  Rate 111 bpm.  QTc 524 ms.  LAFB.  Echo 09/07/2019: 1. Left ventricular ejection fraction, by estimation, is 60 to 65%. The  left ventricle has normal function. The left ventricle has no regional  wall motion abnormalities. Left ventricular diastolic parameters are  consistent with Grade I diastolic  dysfunction (impaired relaxation).  2. Right ventricular systolic function is normal. The right ventricular  size is normal.  3. The mitral valve is normal in structure. No evidence of mitral valve  regurgitation. No evidence of mitral stenosis.  4. The aortic valve is normal in structure. Aortic valve regurgitation is  not visualized.  No aortic stenosis is present.  5. The inferior vena cava is normal in size with greater than 50%  respiratory variability, suggesting right atrial pressure of 3 mmHg.   Recent Labs: 06/17/2019: TSH 2.426 06/30/2019: ALT 95; BUN 12; Creatinine, Ser 0.52; Hemoglobin 12.0; Magnesium 1.5; Platelets 401.0; Potassium 4.0; Sodium 135    Lipid Panel    Component Value Date/Time   CHOL 147 01/01/2017 0843   TRIG 181 (H) 01/01/2017 0843   HDL 33 (L) 01/01/2017 0843   CHOLHDL 4.5 01/01/2017 0843   VLDL 28 11/11/2013 1531   LDLCALC 86 01/01/2017 0843      Wt Readings from Last 3 Encounters:  11/04/19 (!) 274 lb 9.6 oz (124.6 kg)  10/19/19 274 lb 12.8 oz (124.6 kg)  08/16/19 280 lb (127 kg)      ASSESSMENT AND PLAN:  # Sinus tachycardia:  # Essential hypertension:   Heart rate much better on metoprolol.  Her mom notes that all this started at the time when her diabetes was very out of control.  She did have issues with tachycardia prior to that.  She would like to try and wean her off the metoprolol.  I think this is reasonable.  She will track her blood pressure and heart rate each day for a week.  After that if her heart rates are staying consistently under 100, she will reduce metoprolol to 25 mg twice daily.  If it stays under 100 bpm  after that she can try to take it off.  She will call us if she needs to make any changes to the metoprolol.  Echo showed normal systolic function.  Lab work, including plasma metanephrines was unremarkable.  Blood pressure has been much more stable.  She was encouraged to keep up the weight loss efforts.  # Obesity:  Encouraged to increase her exercise to 150 minutes weekly.   Current medicines are reviewed at length with the patient today.  The patient does not have concerns regarding medicines.  The following changes have been made: Reduce metoprolol to 25 mg twice daily.  Labs/ tests ordered today include:   No orders of the defined types were placed in this encounter.    Disposition:   FU with Lizzie Cokley C. Oval Linsey, MD, [facility] in 6 months.     Signed, Colleene Swarthout C. Oval Linsey, MD, [facility]  11/04/2019 11:23 AM    [place]

## 2019-11-04 NOTE — Patient Instructions (Addendum)
Medication Instructions:  Your physician recommends that you continue on your current medications as directed. Please refer to the Current Medication list given to you today.   *If you need a refill on your cardiac medications before your next appointment, please call your pharmacy*  Lab Work: NONE   Testing/Procedures: NONE  Follow-Up: At [facility], you and your health needs are our priority.  As part of our continuing mission to provide you with exceptional heart care, we have created designated Provider Care Teams.  These Care Teams include your primary Cardiologist (physician) and Advanced Practice Providers (APPs -  Physician Assistants and Nurse Practitioners) who all work together to provide you with the care you need, when you need it.  We recommend signing up for the patient portal called "MyChart".  Sign up information is provided on this After Visit Summary.  MyChart is used to connect with patients for Virtual Visits (Telemedicine).  Patients are able to view lab/test results, encounter notes, upcoming appointments, etc.  Non-urgent messages can be sent to your provider as well.   To learn more about what you can do with MyChart, go to [email or web address].    Your next appointment:   6 month(s)  You will receive a reminder letter in the mail two months in advance. If you don't receive a letter, please call our office to schedule the follow-up appointment.  The format for your next appointment:   In Person  Provider:   You may see Elouise Munroe, MD or one of the following Advanced Practice Providers on your designated Care Team:    Kerin Ransom, PA-C  [place], [place]  Coletta Memos, [place]  Other Instructions MONITOR YOUR BLOOD PRESSURE AND HEART RATE FOR 1 WEEK. IF YOU HEART RATE BELOW 100 DECREASE YOUR METOPROLOL TO 25 MG TWICE A DAY. CALL IF CHANGES MADE

## 2019-11-13 DIAGNOSIS — Z20822 Contact with and (suspected) exposure to covid-19: Secondary | ICD-10-CM | POA: Diagnosis not present

## 2019-11-13 DIAGNOSIS — Z03818 Encounter for observation for suspected exposure to other biological agents ruled out: Secondary | ICD-10-CM | POA: Diagnosis not present

## 2019-12-15 ENCOUNTER — Other Ambulatory Visit: Payer: Self-pay | Admitting: Family Medicine

## 2019-12-15 DIAGNOSIS — I10 Essential (primary) hypertension: Secondary | ICD-10-CM

## 2019-12-21 ENCOUNTER — Other Ambulatory Visit: Payer: Self-pay | Admitting: Family Medicine

## 2020-01-19 ENCOUNTER — Ambulatory Visit: Payer: Medicare Other | Admitting: Family Medicine

## 2020-02-01 ENCOUNTER — Other Ambulatory Visit: Payer: Self-pay

## 2020-02-01 ENCOUNTER — Encounter: Payer: Self-pay | Admitting: Family Medicine

## 2020-02-01 ENCOUNTER — Ambulatory Visit (INDEPENDENT_AMBULATORY_CARE_PROVIDER_SITE_OTHER): Payer: Medicare Other | Admitting: Family Medicine

## 2020-02-01 DIAGNOSIS — L219 Seborrheic dermatitis, unspecified: Secondary | ICD-10-CM | POA: Diagnosis not present

## 2020-02-01 DIAGNOSIS — K0889 Other specified disorders of teeth and supporting structures: Secondary | ICD-10-CM | POA: Diagnosis not present

## 2020-02-01 DIAGNOSIS — E119 Type 2 diabetes mellitus without complications: Secondary | ICD-10-CM | POA: Diagnosis not present

## 2020-02-01 LAB — POCT GLYCOSYLATED HEMOGLOBIN (HGB A1C): Hemoglobin A1C: 5.4 % (ref 4.0–5.6)

## 2020-02-01 MED ORDER — KETOCONAZOLE 2 % EX SHAM: MEDICATED_SHAMPOO | CUTANEOUS | 0 refills | Status: DC

## 2020-02-01 NOTE — Assessment & Plan Note (Signed)
This has recurred.  We will trial ketoconazole shampoo.

## 2020-02-01 NOTE — Progress Notes (Signed)
[name], MD Phone: [phone]  Amanda Snyder is a 24 y.o. female who presents today for f/u.  DIABETES Disease Monitoring: Blood Sugar ranges-95-103 Polyuria/phagia/dipsia-some polyuria that has been chronic.  Optho- due, though mom is holding off given pandemic Medications: Compliance- taking metformin XR 500 mg BID Hypoglycemic symptoms- no  Seborrheic dermatitis: Patient's mom notes this is returned in her scalp.  The prior shampoo she used worked really well.  She is been scratching her scalp.  The patient's mom notes that she keeps saying she needs to see the doctor though has not noted anything wrong.  The patient's mother has not noticed any significant differences other than maybe she has some tooth pain when brushing her teeth.  She is still urinating frequently.   Social History   Tobacco Use  Smoking Status Never Smoker  Smokeless Tobacco Never Used     ROS see history of present illness  Objective  Physical Exam Vitals:   02/01/20 1104  BP: 120/70  Pulse: 76  Temp: (!) 96.4 F (35.8 C)  SpO2: 94%    BP Readings from Last 3 Encounters:  02/01/20 120/70  11/04/19 120/70  10/19/19 120/80   Wt Readings from Last 3 Encounters:  02/01/20 270 lb 6.4 oz (122.7 kg)  11/04/19 (!) 274 lb 9.6 oz (124.6 kg)  10/19/19 274 lb 12.8 oz (124.6 kg)    Physical Exam Constitutional:      General: She is not in acute distress. HENT:     Mouth/Throat:     Comments: Exam difficult given intellectual disability though the teeth that I can see appear normal Cardiovascular:     Rate and Rhythm: Normal rate and regular rhythm.  Pulmonary:     Effort: Pulmonary effort is normal.  Neurological:     Mental Status: She is alert.      Assessment/Plan: Please see individual problem list.  Problem List Items Addressed This Visit    Pain, dental    Patient with possible tooth pain.  Exam is difficult given her intellectual disabilities.  Discussed that she  needs to see a dentist.  The patient's mother will contact Medicaid and Medicare to see if they provide coverage for this.  If they need a referral for a dentist she will let us know.      Seborrheic dermatitis of scalp    This has recurred.  We will trial ketoconazole shampoo.      Type 2 diabetes mellitus without complication, without long-term current use of insulin ([place])    Seems well controlled.  We will check an A1c today.  She will continue Metformin XR 500 mg twice daily.  Encouraged him to see an eye doctor when her mother feels comfortable taking her.      Relevant Orders   POCT HgB A1C (Completed)       Health Maintenance: I encouraged the patient to get the COVID-19 vaccine.  Discussed she is at high risk given her weight and diabetes history.  Discussed the safety of the COVID-19 vaccines in particular the mRNA vaccines and their efficacy.  Discussed that the vaccine does not reduce the risk of getting Covid to 0 though does greatly decrease the risk of getting Covid.  I also discussed that it greatly decreases the risk of death and hospitalization from COVID-19.  I discussed that the risk of an adverse event related to COVID-19 generally outweighs the risk of an adverse event related to the vaccine.  I encouraged the patient's mother to  think about getting the patient the vaccine and to do so when they are ready.  I recommended that the patient get one of the mRNA vaccines.   This visit occurred during the SARS-CoV-2 public health emergency.  Safety protocols were in place, including screening questions prior to the visit, additional usage of staff PPE, and extensive cleaning of exam room while observing appropriate contact time as indicated for disinfecting solutions.    [name], MD [place]

## 2020-02-01 NOTE — Patient Instructions (Addendum)
Nice to see you. Please consider getting the COVID-19 vaccine.  I would recommend the [place] or maternal vaccines. Please use the ketoconazole shampoo.  Please let us know if her scalp does not improve.

## 2020-02-01 NOTE — Assessment & Plan Note (Signed)
Patient with possible tooth pain.  Exam is difficult given her intellectual disabilities.  Discussed that she needs to see a dentist.  The patient's mother will contact Medicaid and Medicare to see if they provide coverage for this.  If they need a referral for a dentist she will let us know.

## 2020-02-01 NOTE — Assessment & Plan Note (Signed)
Seems well controlled.  We will check an A1c today.  She will continue Metformin XR 500 mg twice daily.  Encouraged him to see an eye doctor when her mother feels comfortable taking her.

## 2020-02-02 NOTE — Progress Notes (Signed)
Called and cancelled the Immediate release metformin with the pharmacist at [facility]

## 2020-03-29 ENCOUNTER — Ambulatory Visit: Payer: Medicare Other

## 2020-03-29 ENCOUNTER — Telehealth: Payer: Self-pay

## 2020-03-29 NOTE — Telephone Encounter (Signed)
Unable to reach patient for scheduled awv. No answer. Left message to reschedule.

## 2020-04-13 ENCOUNTER — Other Ambulatory Visit: Payer: Self-pay | Admitting: Family Medicine

## 2020-04-13 ENCOUNTER — Encounter: Payer: Self-pay | Admitting: Family Medicine

## 2020-04-13 DIAGNOSIS — I10 Essential (primary) hypertension: Secondary | ICD-10-CM

## 2020-04-13 MED ORDER — METFORMIN HCL ER 500 MG PO TB24: ORAL_TABLET | ORAL | 1 refills | Status: DC

## 2020-05-15 ENCOUNTER — Encounter: Payer: Self-pay | Admitting: Family Medicine

## 2020-06-08 ENCOUNTER — Telehealth: Payer: Self-pay

## 2020-06-09 NOTE — Telephone Encounter (Signed)
Medical supply form given to provider.  Paige Monarrez,cma

## 2020-06-14 NOTE — Telephone Encounter (Signed)
Paperwork faxed to [facility], confirmation given.

## 2020-06-14 NOTE — Telephone Encounter (Signed)
Signed.  Please fax back.

## 2020-06-15 IMAGING — CT CT ANGIO CHEST
2 of 7 series · 18 of 46 positions shown · IV contrast (omnipaque)
Comparison: Chest radiograph June 15, 2019

CLINICAL DATA: Shortness of breath and tachycardia

EXAM:
CT ANGIOGRAPHY CHEST WITH CONTRAST
TECHNIQUE: Multidetector CT imaging of the chest was performed using the
standard protocol during bolus administration of intravenous
contrast. Multiplanar CT image reconstructions and MIPs were
obtained to evaluate the vascular anatomy.
CONTRAST:  100mL OMNIPAQUE IOHEXOL 350 MG/ML SOLN

[Series 9: thins · axial · 0.79mm/px · z∈[+1153,+1387]mm · 15 of 262 slices shown]
[im 14/262  lung]
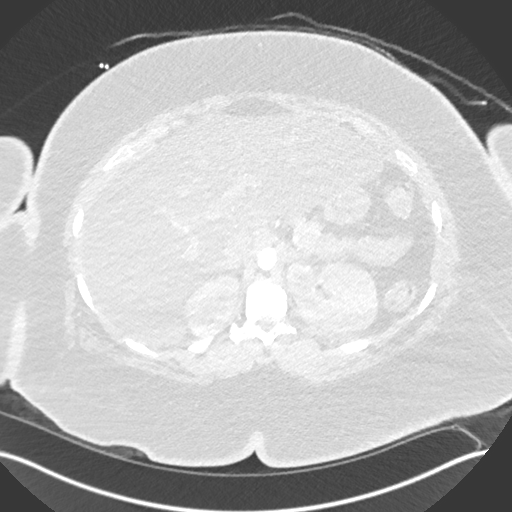
[im 28/262  soft-tissue]
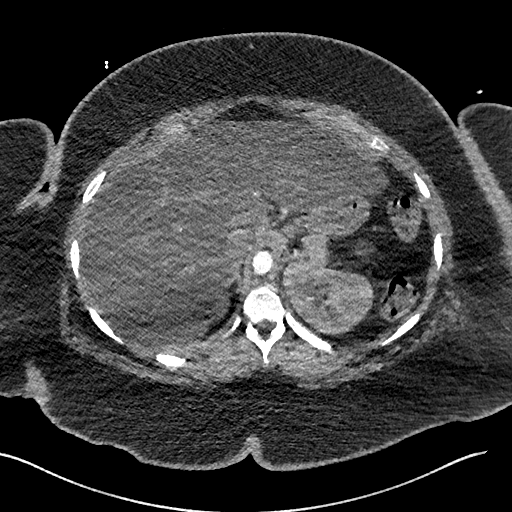
[im 55/262  lung]
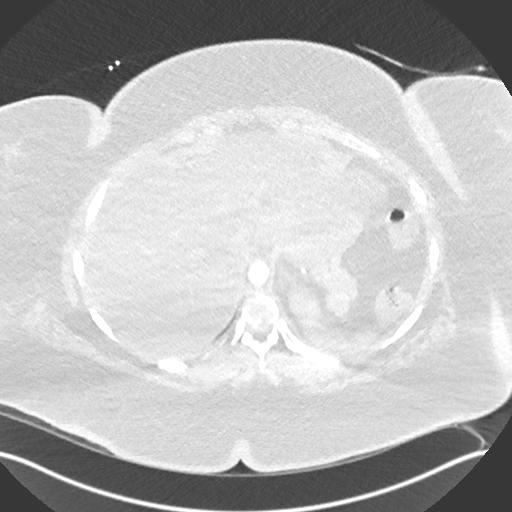
[im 69/262  soft-tissue]
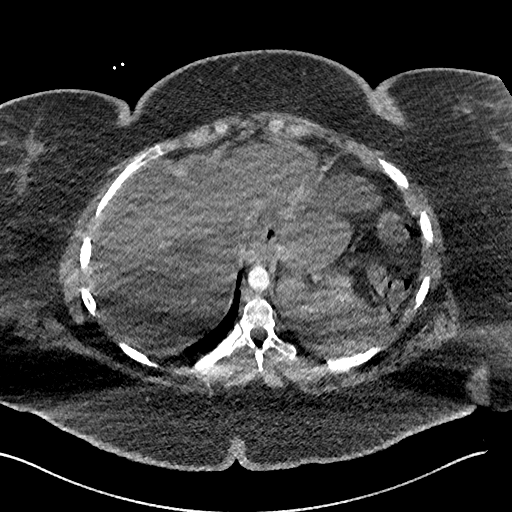
[im 83/262  lung]
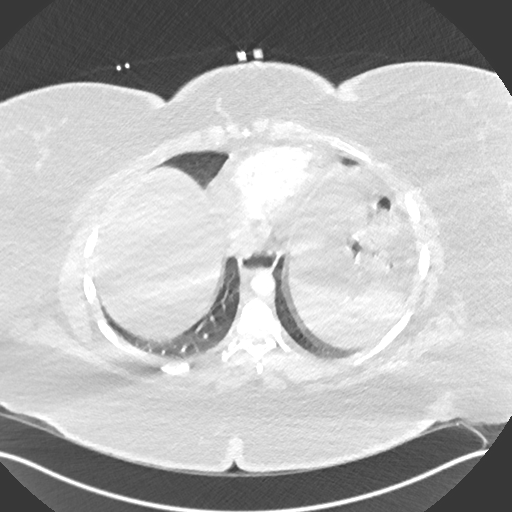
[im 97/262  soft-tissue]
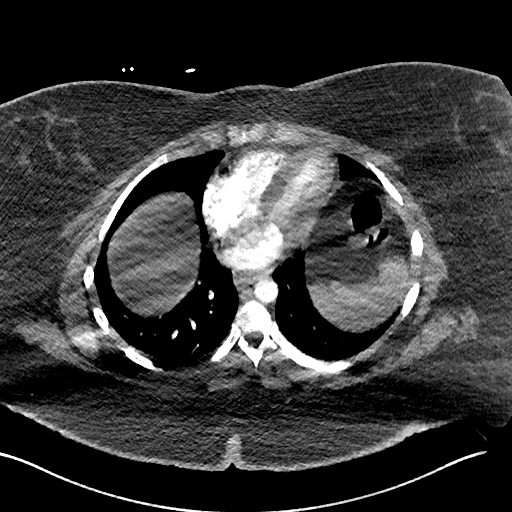
[im 110/262  lung]
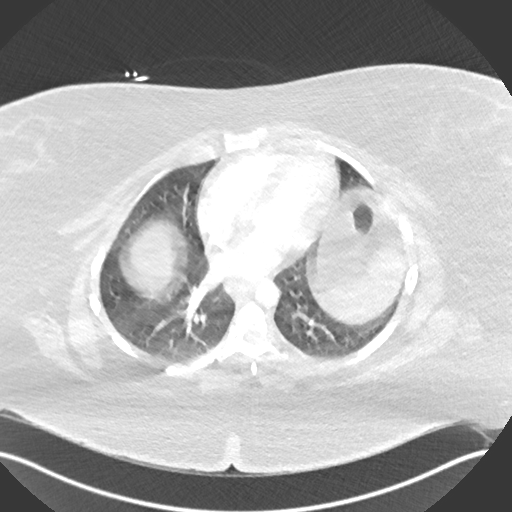
[im 138/262  soft-tissue]
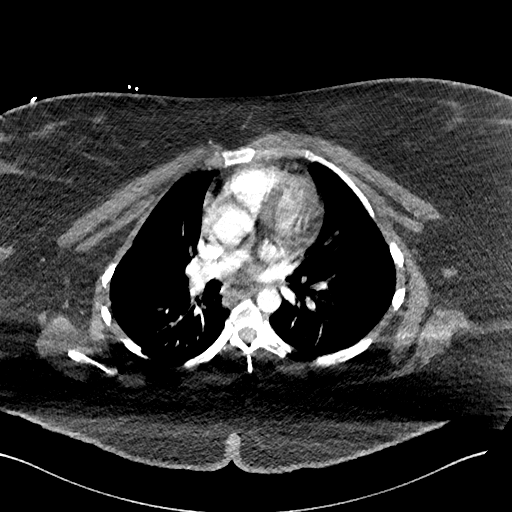
[im 152/262  lung]
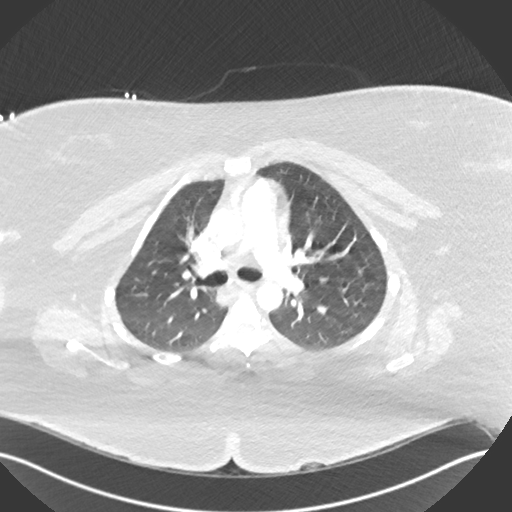
[im 165/262  soft-tissue]
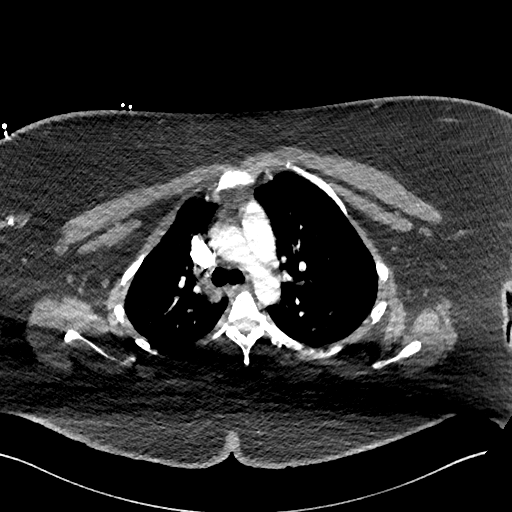
[im 179/262  lung]
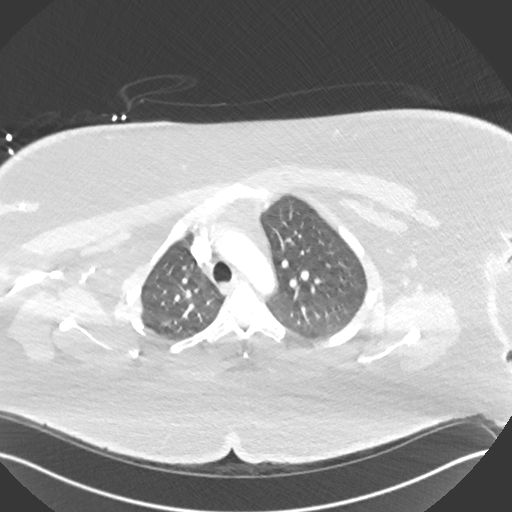
[im 193/262  soft-tissue]
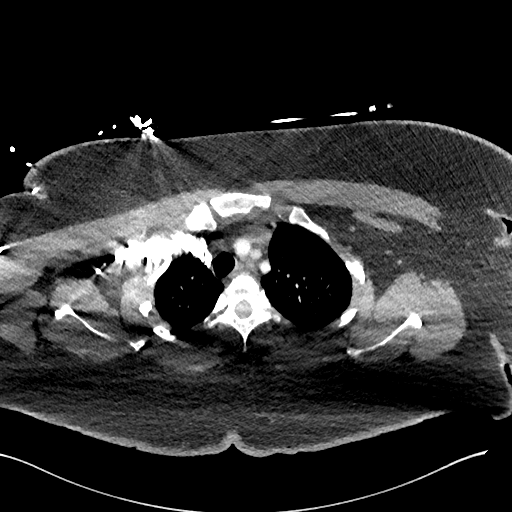
[im 220/262  lung]
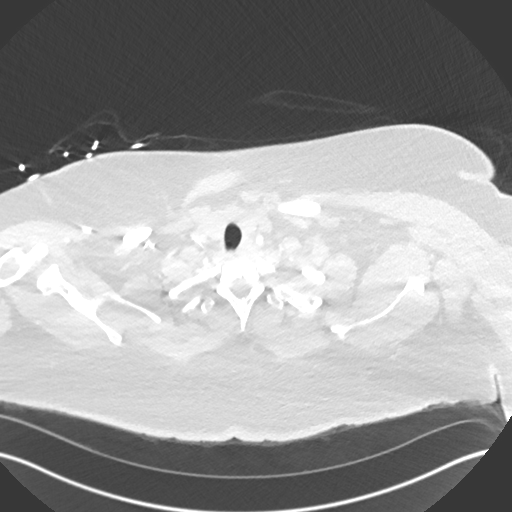
[im 234/262  soft-tissue]
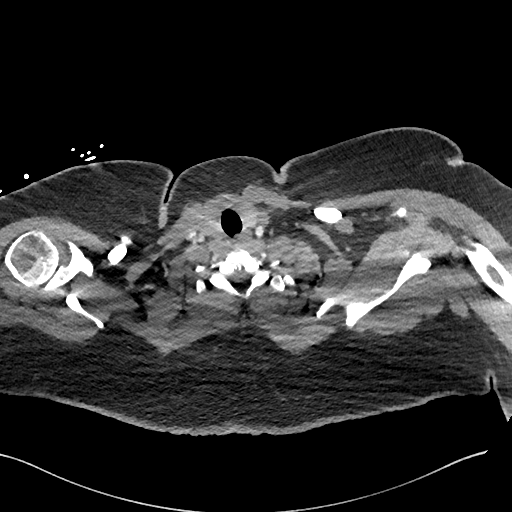
[im 248/262  lung]
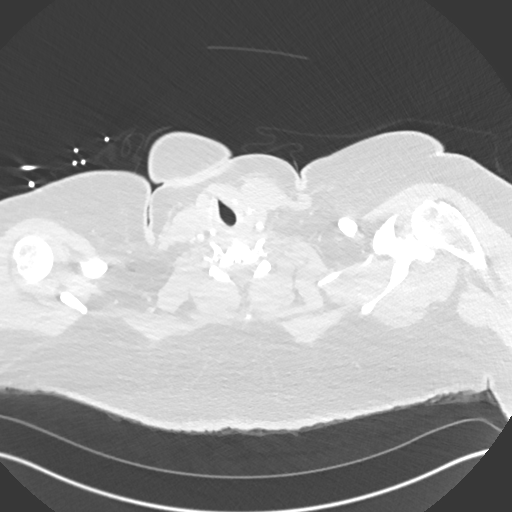

[Series 11: coronal mpr · coronal · 0.63mm/px · 3 of 169 slices shown]
[im 43/169  soft-tissue]
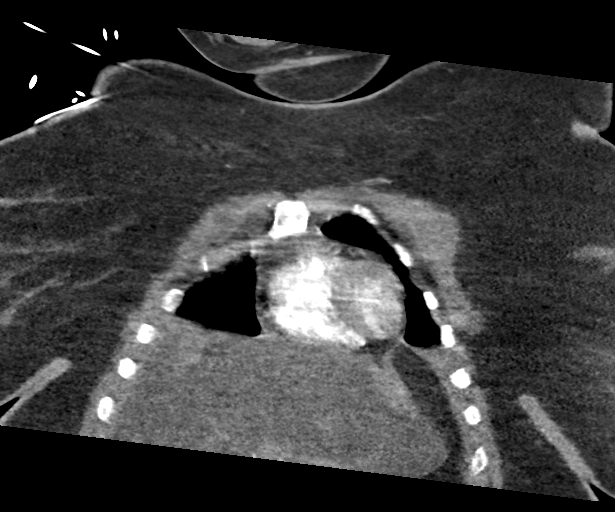
[im 85/169  soft-tissue]
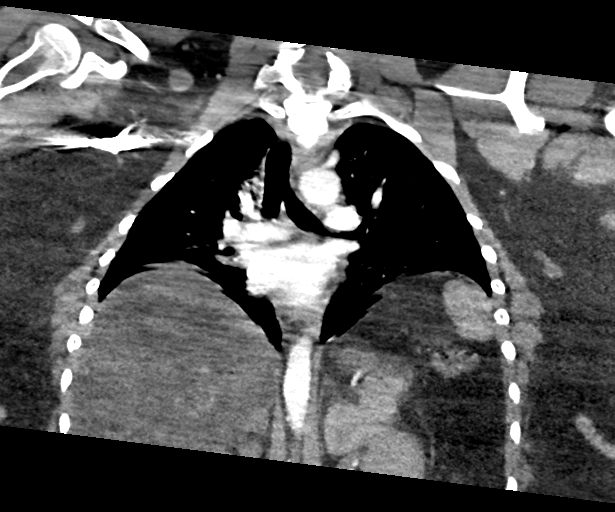
[im 127/169  soft-tissue]
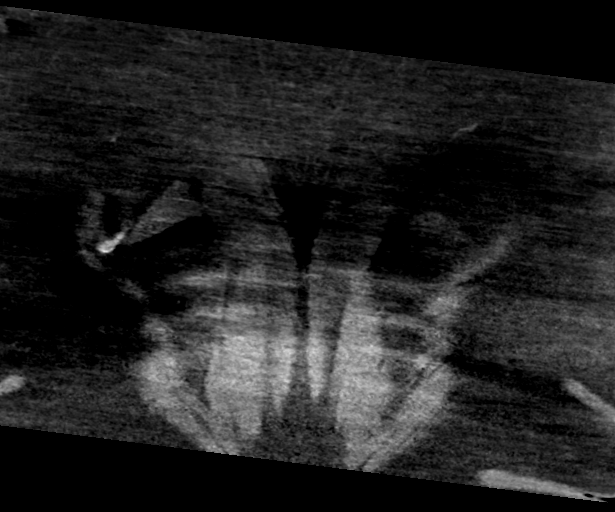

[18 of 46 positions shown; findings below may reference images not displayed]

FINDINGS: Cardiovascular: There is no demonstrable pulmonary embolus. There is
no thoracic aortic aneurysm or dissection. Visualized great vessels
appear unremarkable. Note that the right innominate and left common
carotid arteries arise as a common trunk, an anatomic variant. There
is no evident pericardial effusion or pericardial thickening.

Mediastinum/Nodes: Thyroid appears normal. No evident thoracic
adenopathy. There is a small hiatal hernia.

Lungs/Pleura: Lungs are clear.  No pleural effusions are evident.

Upper Abdomen: There is hepatic steatosis. Visualized upper
abdominal structures otherwise appear unremarkable.

Musculoskeletal: No blastic or lytic bone lesions are evident. No
chest wall lesions.

Review of the MIP images confirms the above findings.
IMPRESSION: 1. No demonstrable pulmonary embolus. No thoracic aortic aneurysm or
dissection.

2.  Lungs clear.

3.  No adenopathy.

4.  Small hiatal hernia.

5.  Hepatic steatosis.

## 2020-08-01 ENCOUNTER — Ambulatory Visit (INDEPENDENT_AMBULATORY_CARE_PROVIDER_SITE_OTHER): Payer: 59 | Admitting: Family Medicine

## 2020-08-01 ENCOUNTER — Encounter: Payer: Self-pay | Admitting: Family Medicine

## 2020-08-01 ENCOUNTER — Other Ambulatory Visit: Payer: Self-pay

## 2020-08-01 DIAGNOSIS — S99922A Unspecified injury of left foot, initial encounter: Secondary | ICD-10-CM

## 2020-08-01 DIAGNOSIS — R000 Tachycardia, unspecified: Secondary | ICD-10-CM

## 2020-08-01 DIAGNOSIS — S99929A Unspecified injury of unspecified foot, initial encounter: Secondary | ICD-10-CM | POA: Insufficient documentation

## 2020-08-01 DIAGNOSIS — E119 Type 2 diabetes mellitus without complications: Secondary | ICD-10-CM

## 2020-08-01 LAB — POCT GLYCOSYLATED HEMOGLOBIN (HGB A1C): Hemoglobin A1C: 5.2 % (ref 4.0–5.6)

## 2020-08-01 NOTE — Assessment & Plan Note (Signed)
Suspect muscular strain.  Pain and gait abnormality have resolved per the patient's mother.  She had a benign exam.  They will monitor for recurrence.

## 2020-08-01 NOTE — Assessment & Plan Note (Addendum)
The patient's A1c is very well controlled at 5.2.  We will have her decrease her metformin to 1 tablet once daily.  Follow-up in 6 months.

## 2020-08-01 NOTE — Progress Notes (Signed)
[name], MD Phone: [phone]  Amanda Snyder is a 25 y.o. female who presents today for f/u.  The patient's mother provides most of the history.  History from the patient is limited given her severe intellectual disability.  DIABETES Disease Monitoring: Blood Sugar ranges-97 this morning Polyuria/phagia/dipsia- no      Optho- due Medications: Compliance- taking metformin XR 500 mg BID Hypoglycemic symptoms- no Mom is learning to manage sugar intake.  Inappropriate sinus tachycardia: Patient's mother notes she has not checked her heart rate recently.  She did decrease metoprolol to once a day.  Foot injury: Patient's mother notes the patient stepped on a rock a couple of weeks ago.  She was limping and walking on the outside edge of her left foot for a while though notes she is back to walking normal and has not complained of pain recently.  There was some bruising though this appears to have improved.  Patient's mother notes she occasionally states that something has bitten her though the patient's mother never is able to find anything abnormal.  Today she feels that something has bitten her in her legs near her knees.   Social History   Tobacco Use  Smoking Status Never Smoker  Smokeless Tobacco Never Used    Current Outpatient Medications on File Prior to Visit  Medication Sig Dispense Refill  . blood glucose meter kit and supplies KIT Dispense one touch ultra. Use 3 times daily as directed. (FOR ICD-10 E11.9). 1 each 0  . cetirizine (ZYRTEC) 10 MG tablet Take 1 tablet (10 mg total) by mouth daily. (Patient taking differently: Take 10 mg by mouth as needed for allergies.) 30 tablet 11  . EPINEPHrine (EPIPEN 2-PAK) 0.3 mg/0.3 mL IJ SOAJ injection Inject 0.3 mLs (0.3 mg total) into the muscle once as needed (anaphylaxis). 1 Device 0  . glucose blood test strip Dispense one touch ultra. Use 3 times daily as directed. (FOR ICD-10 E11.9). 100 each 12  . ibuprofen (ADVIL)  400 MG tablet Take 400 mg by mouth as needed for mild pain or moderate pain.     Marland Kitchen ketoconazole (NIZORAL) 2 % shampoo Apply 5 to 10 mL to wet scalp, lather, leave on 3 to 5 minutes, and rinse; apply twice weekly for 2 to 4 weeks. 120 mL 0  . Lancets (ONETOUCH DELICA PLUS [id number]) MISC Apply topically 3 (three) times daily.    . metFORMIN (GLUCOPHAGE-XR) 500 MG 24 hr tablet TAKE 3 TABLETS (1,500 MG TOTAL) BY MOUTH DAILY WITH BREAKFAST. 270 tablet 1  . metoprolol tartrate (LOPRESSOR) 50 MG tablet TAKE 1 TABLET BY MOUTH TWICE A DAY 180 tablet 0  . NON FORMULARY Take 1 tablet by mouth as needed (constipation). Stool softner    . ondansetron (ZOFRAN) 4 MG tablet Take 1 tablet (4 mg total) by mouth every 6 (six) hours as needed for nausea. 20 tablet 0  . polyethylene glycol (MIRALAX / GLYCOLAX) 17 g packet Take 17 g by mouth daily as needed. 14 each 0  . senna-docusate (SENOKOT-S) 8.6-50 MG tablet Take 1 tablet by mouth at bedtime. 30 tablet 0   No current facility-administered medications on file prior to visit.     ROS see history of present illness  Objective  Physical Exam Vitals:   08/01/20 1109  BP: 120/80  Pulse: (!) 58  Temp: (!) 97.1 F (36.2 C)  SpO2: 99%    BP Readings from Last 3 Encounters:  08/01/20 120/80  02/01/20 120/70  11/04/19 120/70  Wt Readings from Last 3 Encounters:  08/01/20 263 lb 6.4 oz (119.5 kg)  02/01/20 270 lb 6.4 oz (122.7 kg)  11/04/19 (!) 274 lb 9.6 oz (124.6 kg)    Physical Exam Constitutional:      General: She is not in acute distress.    Appearance: She is not diaphoretic.  Cardiovascular:     Rate and Rhythm: Normal rate and regular rhythm.     Heart sounds: Normal heart sounds.  Pulmonary:     Effort: Pulmonary effort is normal.     Breath sounds: Normal breath sounds.  Musculoskeletal:     Comments: Left foot with no tenderness or swelling, no tenderness of her left ankle  Skin:    General: Skin is warm and dry.     Comments:  No signs of any lesions on her legs in the area that she states she has been bitten   Neurological:     Mental Status: She is alert.      Assessment/Plan: Please see individual problem list.  Problem List Items Addressed This Visit    Type 2 diabetes mellitus without complication, without long-term current use of insulin ([place])    The patient's A1c is very well controlled at 5.2.  We will have her decrease her metformin to 1 tablet once daily.  Follow-up in 6 months.      Relevant Orders   POCT HgB A1C (Completed)   Inappropriate sinus tachycardia    Heart rate is very well controlled.  I have encouraged her mother to check her heart rate at home and send me the readings to determine if we still need metoprolol on board.  For now she will continue metoprolol 50 mg once daily.      Foot injury    Suspect muscular strain.  Pain and gait abnormality have resolved per the patient's mother.  She had a benign exam.  They will monitor for recurrence.        Biting sensation: There is nothing abnormal in the area of reported sensation of being bitten.  They will monitor for any lesions for specific causes noted by the mother.  Health maintenance: I encouraged him to see her gynecologist for Pap smear.  Encouraged seeing ophthalmology once yearly.  I encouraged COVID-19 vaccine.  The patient's mother will check to see when her last tetanus vaccine was.  This visit occurred during the SARS-CoV-2 public health emergency.  Safety protocols were in place, including screening questions prior to the visit, additional usage of staff PPE, and extensive cleaning of exam room while observing appropriate contact time as indicated for disinfecting solutions.    [name], MD [place]

## 2020-08-01 NOTE — Patient Instructions (Signed)
Nice to see you. Please check her heart rate daily and send me the readings in about a week. Please monitor her foot and if she starts to complain of pain or limping please let me know.

## 2020-08-01 NOTE — Assessment & Plan Note (Signed)
Heart rate is very well controlled.  I have encouraged her mother to check her heart rate at home and send me the readings to determine if we still need metoprolol on board.  For now she will continue metoprolol 50 mg once daily.

## 2020-11-22 ENCOUNTER — Other Ambulatory Visit: Payer: Self-pay | Admitting: Family Medicine

## 2020-11-22 MED ORDER — METFORMIN HCL ER 500 MG PO TB24: 500.0000 mg | ORAL_TABLET | Freq: Every day | ORAL | 1 refills | Status: DC

## 2020-12-01 ENCOUNTER — Other Ambulatory Visit: Payer: Self-pay | Admitting: Family Medicine

## 2020-12-01 DIAGNOSIS — I10 Essential (primary) hypertension: Secondary | ICD-10-CM

## 2021-01-31 ENCOUNTER — Encounter: Payer: Self-pay | Admitting: Family Medicine

## 2021-01-31 ENCOUNTER — Other Ambulatory Visit: Payer: Self-pay

## 2021-01-31 ENCOUNTER — Ambulatory Visit (INDEPENDENT_AMBULATORY_CARE_PROVIDER_SITE_OTHER): Payer: 59 | Admitting: Family Medicine

## 2021-01-31 VITALS — BP 110/76 | HR 86 | Temp 96.4°F | Ht 62.01 in | Wt 269.4 lb

## 2021-01-31 DIAGNOSIS — R000 Tachycardia, unspecified: Secondary | ICD-10-CM | POA: Diagnosis not present

## 2021-01-31 DIAGNOSIS — E119 Type 2 diabetes mellitus without complications: Secondary | ICD-10-CM | POA: Diagnosis not present

## 2021-01-31 DIAGNOSIS — R350 Frequency of micturition: Secondary | ICD-10-CM

## 2021-01-31 DIAGNOSIS — I10 Essential (primary) hypertension: Secondary | ICD-10-CM | POA: Diagnosis not present

## 2021-01-31 DIAGNOSIS — L819 Disorder of pigmentation, unspecified: Secondary | ICD-10-CM

## 2021-01-31 DIAGNOSIS — L729 Follicular cyst of the skin and subcutaneous tissue, unspecified: Secondary | ICD-10-CM

## 2021-01-31 DIAGNOSIS — Z124 Encounter for screening for malignant neoplasm of cervix: Secondary | ICD-10-CM

## 2021-01-31 NOTE — Assessment & Plan Note (Signed)
Plan to check A1c.  They will continue metformin 1 tablet once daily.  Check urine microalbumin as well.

## 2021-01-31 NOTE — Assessment & Plan Note (Signed)
Improved on recheck.  She will continue metoprolol.  We will continue to monitor.

## 2021-01-31 NOTE — Assessment & Plan Note (Signed)
Sinus rhythm today.  She will continue metoprolol 50 mg twice daily.

## 2021-01-31 NOTE — Progress Notes (Addendum)
[name], MD Phone: [phone]  Amanda Snyder is a 25 y.o. female who presents today for follow-up.  Diabetes: Her mother notes her sugars have been less than 100.  She is taking the metformin once a day though occasionally takes it twice a day if she has high sugar foods.  She has had some polyuria.  They noticed a little cloudy today.  No hypoglycemia.  She is due to see ophthalmology.  Cyst: The mother reports a cyst on the patient's back over her midline.  There is no pain associated with this.  Her mother feels as though this is reduced in size.  Tachycardia: This is a chronic issue.  The patient's mother restarted her on metoprolol as she noted her heart rate had been elevated.  Skin lesion: This was noted on exam today.  She has a hyperpigmented skin lesion on the plantar surface of her right second toe.  Her mother has not noticed this previously.  Social History   Tobacco Use  Smoking Status Never  Smokeless Tobacco Never    Current Outpatient Medications on File Prior to Visit  Medication Sig Dispense Refill   blood glucose meter kit and supplies KIT Dispense one touch ultra. Use 3 times daily as directed. (FOR ICD-10 E11.9). 1 each 0   cetirizine (ZYRTEC) 10 MG tablet Take 1 tablet (10 mg total) by mouth daily. (Patient taking differently: Take 10 mg by mouth as needed for allergies.) 30 tablet 11   EPINEPHrine (EPIPEN 2-PAK) 0.3 mg/0.3 mL IJ SOAJ injection Inject 0.3 mLs (0.3 mg total) into the muscle once as needed (anaphylaxis). 1 Device 0   glucose blood test strip Dispense one touch ultra. Use 3 times daily as directed. (FOR ICD-10 E11.9). 100 each 12   ibuprofen (ADVIL) 400 MG tablet Take 400 mg by mouth as needed for mild pain or moderate pain.      ketoconazole (NIZORAL) 2 % shampoo Apply 5 to 10 mL to wet scalp, lather, leave on 3 to 5 minutes, and rinse; apply twice weekly for 2 to 4 weeks. 120 mL 0   Lancets (ONETOUCH DELICA PLUS [id number]) MISC Apply  topically 3 (three) times daily.     metFORMIN (GLUCOPHAGE-XR) 500 MG 24 hr tablet Take 1 tablet (500 mg total) by mouth daily with breakfast. 90 tablet 1   metoprolol tartrate (LOPRESSOR) 50 MG tablet TAKE 1 TABLET BY MOUTH TWICE A DAY 180 tablet 1   NON FORMULARY Take 1 tablet by mouth as needed (constipation). Stool softner     ondansetron (ZOFRAN) 4 MG tablet Take 1 tablet (4 mg total) by mouth every 6 (six) hours as needed for nausea. 20 tablet 0   polyethylene glycol (MIRALAX / GLYCOLAX) 17 g packet Take 17 g by mouth daily as needed. 14 each 0   senna-docusate (SENOKOT-S) 8.6-50 MG tablet Take 1 tablet by mouth at bedtime. 30 tablet 0   No current facility-administered medications on file prior to visit.     ROS see history of present illness  Objective  Physical Exam Vitals:   01/31/21 1114 01/31/21 1131  BP: (!) 146/86 110/76  Pulse: 86   Temp: (!) 96.4 F (35.8 C)   SpO2: 98%     BP Readings from Last 3 Encounters:  01/31/21 110/76  08/01/20 120/80  02/01/20 120/70   Wt Readings from Last 3 Encounters:  01/31/21 269 lb 6.4 oz (122.2 kg)  08/01/20 263 lb 6.4 oz (119.5 kg)  02/01/20 270 lb 6.4 oz (  122.7 kg)    Physical Exam Constitutional:      General: She is not in acute distress.    Appearance: She is not diaphoretic.  Cardiovascular:     Rate and Rhythm: Normal rate and regular rhythm.     Heart sounds: Normal heart sounds.  Pulmonary:     Effort: Pulmonary effort is normal.     Breath sounds: Normal breath sounds.  Skin:    General: Skin is warm and dry.          Comments: Right second toe plantar surface near the tip of the toe with a circular hyperpigmented lesion that is uniform in color and has regular borders  Neurological:     Mental Status: She is alert.   Diabetic Foot Exam - Simple   Simple Foot Form  01/31/2021 11:25 AM  Visual Inspection No deformities, no ulcerations, no other skin breakdown bilaterally: Yes Sensation Testing See  comments: Yes Pulse Check Posterior Tibialis and Dorsalis pulse intact bilaterally: Yes Comments Patient would not cooperate with sensation testing, hyperpigmented skin lesion noted on the second toe plantar surface, no other deformities, ulcerations, or skin breakdown noted      Assessment/Plan: Please see individual problem list.  Problem List Items Addressed This Visit     Hyperpigmented skin lesion    Likely benign nevus though given the location we will have her see dermatology for further evaluation.      Relevant Orders   Ambulatory referral to Dermatology   Hypertension - Primary    Improved on recheck.  She will continue metoprolol.  We will continue to monitor.      Relevant Orders   Comp Met (CMET) (Completed)   Lipid panel (Completed)   Inappropriate sinus tachycardia    Sinus rhythm today.  She will continue metoprolol 50 mg twice daily.      Skin cyst    Advised that the lesion is benign. The lesion is draining some fluid and they will monitor that. Discussed use of warm compresses and monitoring for enlargement and signs of infection. If the lesion continues to drain they will let us know.       Type 2 diabetes mellitus without complication, without long-term current use of insulin ([place])    Plan to check A1c.  They will continue metformin 1 tablet once daily.  Check urine microalbumin as well.      Relevant Orders   Urine Microalbumin w/creat. ratio (Completed)   HgB A1c (Completed)   Comp Met (CMET) (Completed)   Lipid panel (Completed)   Urinary frequency    We will check a urinalysis.  They will collect this at home.  I advised that they need to turn this in the day it is collected.      Relevant Orders   Urinalysis, Routine w reflex microscopic (Completed)   Other Visit Diagnoses     Cervical cancer screening       Relevant Orders   Ambulatory referral to Gynecology        Health Maintenance: GYN referral placed for Pap smear.  They were  encouraged to see the eye doctor as well.  Return in about 6 months (around 08/01/2021).  This visit occurred during the SARS-CoV-2 public health emergency.  Safety protocols were in place, including screening questions prior to the visit, additional usage of staff PPE, and extensive cleaning of exam room while observing appropriate contact time as indicated for disinfecting solutions.    [name], MD [facility]  [place]

## 2021-01-31 NOTE — Assessment & Plan Note (Signed)
Likely benign nevus though given the location we will have her see dermatology for further evaluation.

## 2021-01-31 NOTE — Assessment & Plan Note (Signed)
We will check a urinalysis.  They will collect this at home.  I advised that they need to turn this in the day it is collected.

## 2021-01-31 NOTE — Patient Instructions (Addendum)
Nice to see you. Please use warm compresses on the spot on her back. If it becomes red, larger, or painful please let me know.  Dermatology will call to set up an appointment.  Please have your labs done at the lab at [facility].

## 2021-02-06 ENCOUNTER — Encounter: Payer: Self-pay | Admitting: Family Medicine

## 2021-02-06 ENCOUNTER — Other Ambulatory Visit (INDEPENDENT_AMBULATORY_CARE_PROVIDER_SITE_OTHER): Payer: 59

## 2021-02-06 DIAGNOSIS — I10 Essential (primary) hypertension: Secondary | ICD-10-CM | POA: Diagnosis not present

## 2021-02-06 DIAGNOSIS — E119 Type 2 diabetes mellitus without complications: Secondary | ICD-10-CM

## 2021-02-06 DIAGNOSIS — R350 Frequency of micturition: Secondary | ICD-10-CM | POA: Diagnosis not present

## 2021-02-06 LAB — COMPREHENSIVE METABOLIC PANEL
ALT: 18 U/L (ref 0–35)
AST: 15 U/L (ref 0–37)
Albumin: 3.7 g/dL (ref 3.5–5.2)
Alkaline Phosphatase: 52 U/L (ref 39–117)
BUN: 12 mg/dL (ref 6–23)
CO2: 26 mEq/L (ref 19–32)
Calcium: 8.6 mg/dL (ref 8.4–10.5)
Chloride: 101 mEq/L (ref 96–112)
Creatinine, Ser: 0.46 mg/dL (ref 0.40–1.20)
GFR: 130.95 mL/min (ref >60.00)
Glucose, Bld: 105 mg/dL — ABNORMAL HIGH (ref 70–99)
Potassium: 3.8 mEq/L (ref 3.5–5.1)
Sodium: 135 mEq/L (ref 135–145)
Total Bilirubin: 0.3 mg/dL (ref 0.2–1.2)
Total Protein: 6.8 g/dL (ref 6.0–8.3)

## 2021-02-06 LAB — URINALYSIS, ROUTINE W REFLEX MICROSCOPIC
Bilirubin Urine: NEGATIVE
Hgb urine dipstick: NEGATIVE
Ketones, ur: NEGATIVE
Leukocytes,Ua: NEGATIVE
Nitrite: NEGATIVE
Specific Gravity, Urine: 1.025 (ref 1.000–1.030)
Total Protein, Urine: NEGATIVE
Urine Glucose: NEGATIVE
Urobilinogen, UA: 0.2 (ref 0.0–1.0)
pH: 6 (ref 5.0–8.0)

## 2021-02-06 LAB — LIPID PANEL
Cholesterol: 143 mg/dL (ref 0–200)
HDL: 37.4 mg/dL — ABNORMAL LOW (ref >39.00)
LDL Cholesterol: 77 mg/dL (ref 0–99)
NonHDL: 105.74
Total CHOL/HDL Ratio: 4
Triglycerides: 142 mg/dL (ref 0.0–149.0)
VLDL: 28.4 mg/dL (ref 0.0–40.0)

## 2021-02-06 LAB — MICROALBUMIN / CREATININE URINE RATIO
Creatinine,U: 167.4 mg/dL
Microalb Creat Ratio: 0.4 mg/g (ref 0.0–30.0)
Microalb, Ur: <0.7 mg/dL (ref 0.0–1.9)

## 2021-02-06 LAB — HEMOGLOBIN A1C: Hgb A1c MFr Bld: 5.9 % (ref 4.6–6.5)

## 2021-02-07 DIAGNOSIS — L729 Follicular cyst of the skin and subcutaneous tissue, unspecified: Secondary | ICD-10-CM | POA: Insufficient documentation

## 2021-02-07 NOTE — Assessment & Plan Note (Signed)
Advised that the lesion is benign. The lesion is draining some fluid and they will monitor that. Discussed use of warm compresses and monitoring for enlargement and signs of infection. If the lesion continues to drain they will let us know.

## 2021-02-08 ENCOUNTER — Telehealth: Payer: Self-pay | Admitting: Radiology

## 2021-02-08 NOTE — Telephone Encounter (Signed)
Left message for patient to call [facility] to schedule New GYN appointment for PAP from referral.

## 2021-02-12 ENCOUNTER — Other Ambulatory Visit: Payer: Self-pay

## 2021-02-12 ENCOUNTER — Ambulatory Visit (INDEPENDENT_AMBULATORY_CARE_PROVIDER_SITE_OTHER): Payer: 59 | Admitting: Dermatology

## 2021-02-12 ENCOUNTER — Encounter: Payer: Self-pay | Admitting: Dermatology

## 2021-02-12 DIAGNOSIS — L304 Erythema intertrigo: Secondary | ICD-10-CM | POA: Diagnosis not present

## 2021-02-12 DIAGNOSIS — L720 Epidermal cyst: Secondary | ICD-10-CM

## 2021-02-12 DIAGNOSIS — D2271 Melanocytic nevi of right lower limb, including hip: Secondary | ICD-10-CM

## 2021-02-12 DIAGNOSIS — D229 Melanocytic nevi, unspecified: Secondary | ICD-10-CM

## 2021-02-12 MED ORDER — MOMETASONE FUROATE 0.1 % EX CREA: 1.0000 "application " | TOPICAL_CREAM | Freq: Every day | CUTANEOUS | 2 refills | Status: DC | PRN

## 2021-02-12 NOTE — Patient Instructions (Addendum)

## 2021-02-12 NOTE — Progress Notes (Signed)
   New Patient Visit  Subjective  Amanda Snyder is a 25 y.o. female who presents for the following: Other (Mole of right foot 2nd toe that mother states has been there since she was little) and Cyst (Of back x few months that has drained in the past). Accompanied by mother who gives the entire history  The following portions of the chart were reviewed this encounter and updated as appropriate:   Tobacco  Allergies  Meds  Problems  Med Hx  Surg Hx  Fam Hx     Review of Systems:  No other skin or systemic complaints except as noted in HPI or Assessment and Plan.  Objective  Well appearing patient in no apparent distress; mood and affect are within normal limits.  A focused examination was performed including back, right foot, axillary areas. Relevant physical exam findings are noted in the Assessment and Plan.  Right Tip of 2nd Toe 0.7 x 0.4 cm brown macule       Mid Back Pinkness  Low back spinal Subcutaneous nodule with dilated pore 0.5 cm   Assessment & Plan  Nevus -patient's mother states its been there since she was a small child without change.  There is a slight irregularity.  Recommend rechecking on follow-up with photos today and if change at any point the future recommend removal. Right Tip of 2nd Toe Benign appearing, recheck on follow up. Discussed biopsy with any changes.  Erythema intertrigo Mid Back mometasone (ELOCON) 0.1 % cream - Mid Back Apply 1 application topically daily as needed (Rash of back). Intertrigo is a chronic recurrent rash that occurs in skin fold areas that may be associated with friction; heat; moisture; yeast; fungus; and bacteria.  It is exacerbated by increased movement / activity; sweating; and higher atmospheric temperature.  Epidermal inclusion cyst Low back spinal Benign. Do not recommend excision at this time.  Return in about 6 months (around 08/12/2021) for Follow up.  I, Ashok Cordia, CMA, am acting as scribe for  Sarina Ser, MD . Documentation: I have reviewed the above documentation for accuracy and completeness, and I agree with the above.  Sarina Ser, MD

## 2021-05-15 ENCOUNTER — Other Ambulatory Visit: Payer: Self-pay | Admitting: Family Medicine

## 2021-05-15 DIAGNOSIS — I10 Essential (primary) hypertension: Secondary | ICD-10-CM

## 2021-07-31 ENCOUNTER — Encounter: Payer: Self-pay | Admitting: Family Medicine

## 2021-07-31 ENCOUNTER — Ambulatory Visit (INDEPENDENT_AMBULATORY_CARE_PROVIDER_SITE_OTHER): Payer: 59 | Admitting: Family Medicine

## 2021-07-31 DIAGNOSIS — E119 Type 2 diabetes mellitus without complications: Secondary | ICD-10-CM | POA: Diagnosis not present

## 2021-07-31 DIAGNOSIS — I10 Essential (primary) hypertension: Secondary | ICD-10-CM | POA: Diagnosis not present

## 2021-07-31 DIAGNOSIS — R000 Tachycardia, unspecified: Secondary | ICD-10-CM

## 2021-07-31 DIAGNOSIS — R197 Diarrhea, unspecified: Secondary | ICD-10-CM | POA: Insufficient documentation

## 2021-07-31 DIAGNOSIS — L989 Disorder of the skin and subcutaneous tissue, unspecified: Secondary | ICD-10-CM | POA: Insufficient documentation

## 2021-07-31 MED ORDER — KETOCONAZOLE 2 % EX CREA: 1.0000 "application " | TOPICAL_CREAM | Freq: Every day | CUTANEOUS | 0 refills | Status: DC

## 2021-07-31 NOTE — Assessment & Plan Note (Signed)
Possible tinea versicolor on her face and tinea corporis on her chest.  We will treat with ketoconazole.  If not improving they will let us know. ?

## 2021-07-31 NOTE — Assessment & Plan Note (Signed)
Sinus rhythm and rate controlled.  She will continue metoprolol 50 mg twice daily. ?

## 2021-07-31 NOTE — Assessment & Plan Note (Signed)
Adequately controlled.  She will continue metoprolol 50 mg twice daily. ?

## 2021-07-31 NOTE — Assessment & Plan Note (Signed)
Likely related to artificial sugar intake.  I advised limiting this and monitoring for further diarrhea. ?

## 2021-07-31 NOTE — Patient Instructions (Signed)
Nice to see you. ?Please try the ketoconazole cream on your rash.  If its not improving in the next couple of weeks please let us know. ?Please get your lab work in the next week or so. ?Please monitor the laughing and if it recurs on a consistent basis please let me know. ?

## 2021-07-31 NOTE — Progress Notes (Signed)
?[name], MD ?Phone: [phone] ? ?Amanda Snyder is a 26 y.o. female who presents today for f/u. ? ?HYPERTENSION ?Disease Monitoring ?Home BP Monitoring not checking Chest pain- no    Dyspnea- no ?Medications ?Compliance-  taking metoprolol.  Edema- no ?BMET ?   ?Component Value Date/Time  ? NA 135 02/06/2021 1135  ? K 3.8 02/06/2021 1135  ? CL 101 02/06/2021 1135  ? CO2 26 02/06/2021 1135  ? GLUCOSE 105 (H) 02/06/2021 1135  ? BUN 12 02/06/2021 1135  ? CREATININE 0.46 02/06/2021 1135  ? CREATININE 0.63 06/20/2017 1618  ? CALCIUM 8.6 02/06/2021 1135  ? GFRNONAA >60 06/19/2019 0237  ? GFRAA >60 06/19/2019 0237  ? ?DIABETES ?Disease Monitoring: ?Blood Sugar ranges-not checking as frequently Polyuria/phagia/dipsia- no      Optho- due ?Medications: ?Compliance- taking metformin Hypoglycemic symptoms- no ? ?Diarrhea: Patient's mom notes the patient had some diarrhea after eating a large volume of sugar-free candy.  This resolved and has not recurred. ? ?Skin rash: Patient's mother notes a rash on her right face that is been there about 2 weeks.  There are 3 slightly hypopigmented spots.  They do not itch.  She has a slightly hyperpigmented rash on her right upper chest that has improved with Tinactin over-the-counter. ? ?The patient's mother reports that on occasion previously she would wake up in the middle the night and laugh uncontrollably.  This has not happened recently.  Seem to be related to her stomach bothering her after taking 2 tablets of metformin.  Has not recurred recently. ? ? ?Social History  ? ?Tobacco Use  ?Smoking Status Never  ?Smokeless Tobacco Never  ? ? ?Current Outpatient Medications on File Prior to Visit  ?Medication Sig Dispense Refill  ? blood glucose meter kit and supplies KIT Dispense one touch ultra. Use 3 times daily as directed. (FOR ICD-10 E11.9). 1 each 0  ? cetirizine (ZYRTEC) 10 MG tablet Take 1 tablet (10 mg total) by mouth daily. (Patient taking differently: Take 10 mg  by mouth as needed for allergies.) 30 tablet 11  ? EPINEPHrine (EPIPEN 2-PAK) 0.3 mg/0.3 mL IJ SOAJ injection Inject 0.3 mLs (0.3 mg total) into the muscle once as needed (anaphylaxis). 1 Device 0  ? glucose blood test strip Dispense one touch ultra. Use 3 times daily as directed. (FOR ICD-10 E11.9). 100 each 12  ? ibuprofen (ADVIL) 400 MG tablet Take 400 mg by mouth as needed for mild pain or moderate pain.     ? Lancets (ONETOUCH DELICA PLUS [id number]) MISC Apply topically 3 (three) times daily.    ? metFORMIN (GLUCOPHAGE-XR) 500 MG 24 hr tablet TAKE 1 TABLET BY MOUTH EVERY DAY WITH BREAKFAST 90 tablet 1  ? metoprolol tartrate (LOPRESSOR) 50 MG tablet TAKE 1 TABLET BY MOUTH TWICE A DAY 180 tablet 1  ? mometasone (ELOCON) 0.1 % cream Apply 1 application topically daily as needed (Rash of back). 45 g 2  ? NON FORMULARY Take 1 tablet by mouth as needed (constipation). Stool softner    ? ketoconazole (NIZORAL) 2 % shampoo Apply 5 to 10 mL to wet scalp, lather, leave on 3 to 5 minutes, and rinse; apply twice weekly for 2 to 4 weeks. (Patient not taking: Reported on 07/31/2021) 120 mL 0  ? ondansetron (ZOFRAN) 4 MG tablet Take 1 tablet (4 mg total) by mouth every 6 (six) hours as needed for nausea. (Patient not taking: Reported on 07/31/2021) 20 tablet 0  ? polyethylene glycol (MIRALAX / GLYCOLAX) 17  g packet Take 17 g by mouth daily as needed. (Patient not taking: Reported on 07/31/2021) 14 each 0  ? senna-docusate (SENOKOT-S) 8.6-50 MG tablet Take 1 tablet by mouth at bedtime. (Patient not taking: Reported on 07/31/2021) 30 tablet 0  ? ?No current facility-administered medications on file prior to visit.  ? ? ? ?ROS see history of present illness ? ?Objective ? ?Physical Exam ?Vitals:  ? 07/31/21 1106  ?BP: 130/70  ?Pulse: 96  ?Temp: (!) 97 ?F (36.1 ?C)  ?SpO2: 98%  ? ? ?BP Readings from Last 3 Encounters:  ?07/31/21 130/70  ?01/31/21 110/76  ?08/01/20 120/80  ? ?Wt Readings from Last 3 Encounters:  ?07/31/21 282 lb  (127.9 kg)  ?01/31/21 269 lb 6.4 oz (122.2 kg)  ?08/01/20 263 lb 6.4 oz (119.5 kg)  ? ? ?Physical Exam ?Constitutional:   ?   General: She is not in acute distress. ?   Appearance: She is not diaphoretic.  ?HENT:  ?   Head:  ? ?Cardiovascular:  ?   Rate and Rhythm: Normal rate and regular rhythm.  ?   Heart sounds: Normal heart sounds.  ?Pulmonary:  ?   Effort: Pulmonary effort is normal.  ?   Breath sounds: Normal breath sounds.  ?Skin: ?   General: Skin is warm and dry.  ? ?    ?Neurological:  ?   Mental Status: She is alert.  ? ? ? ?Assessment/Plan: Please see individual problem list. ? ?Problem List Items Addressed This Visit   ? ? Diarrhea  ?  Likely related to artificial sugar intake.  I advised limiting this and monitoring for further diarrhea. ? ?  ?  ? Hypertension  ?  Adequately controlled.  She will continue metoprolol 50 mg twice daily. ? ?  ?  ? Relevant Orders  ? Basic Metabolic Panel (BMET)  ? Inappropriate sinus tachycardia  ?  Sinus rhythm and rate controlled.  She will continue metoprolol 50 mg twice daily. ? ?  ?  ? Skin lesion  ?  Possible tinea versicolor on her face and tinea corporis on her chest.  We will treat with ketoconazole.  If not improving they will let us know. ? ?  ?  ? Relevant Medications  ? ketoconazole (NIZORAL) 2 % cream  ? Type 2 diabetes mellitus without complication, without long-term current use of insulin ([place])  ?  Check A1c.  Continue metformin 500 mg daily. ? ?  ?  ? Relevant Orders  ? HgB A1c  ? ?Laughing: This may be related to her severe intellectual disability though could have also been related to her expression of dealing with some stomach discomfort.  Advised to monitor and if it continues to be an issue they will let us know. ? ?Health Maintenance: Encouraged to see the eye doctor. ? ?Return in about 6 months (around 01/30/2022). ? ?This visit occurred during the SARS-CoV-2 public health emergency.  Safety protocols were in place, including screening questions  prior to the visit, additional usage of staff PPE, and extensive cleaning of exam room while observing appropriate contact time as indicated for disinfecting solutions.  ? ? ?[name], MD ?[place] ? ?

## 2021-07-31 NOTE — Assessment & Plan Note (Signed)
Check A1c.  Continue metformin 500 mg daily. ?

## 2021-08-06 ENCOUNTER — Other Ambulatory Visit: Payer: 59

## 2021-08-06 DIAGNOSIS — I10 Essential (primary) hypertension: Secondary | ICD-10-CM

## 2021-08-06 DIAGNOSIS — E119 Type 2 diabetes mellitus without complications: Secondary | ICD-10-CM

## 2021-08-06 LAB — BASIC METABOLIC PANEL
BUN: 11 mg/dL (ref 6–23)
CO2: 29 mEq/L (ref 19–32)
Calcium: 9.6 mg/dL (ref 8.4–10.5)
Chloride: 100 mEq/L (ref 96–112)
Creatinine, Ser: 0.6 mg/dL (ref 0.40–1.20)
GFR: 122.4 mL/min (ref >60.00)
Glucose, Bld: 86 mg/dL (ref 70–99)
Potassium: 3.9 mEq/L (ref 3.5–5.1)
Sodium: 137 mEq/L (ref 135–145)

## 2021-08-06 LAB — HEMOGLOBIN A1C: Hgb A1c MFr Bld: 5.8 % (ref 4.6–6.5)

## 2021-08-08 ENCOUNTER — Ambulatory Visit: Payer: 59 | Admitting: Dermatology

## 2021-08-22 ENCOUNTER — Ambulatory Visit (INDEPENDENT_AMBULATORY_CARE_PROVIDER_SITE_OTHER): Payer: 59 | Admitting: Dermatology

## 2021-08-22 DIAGNOSIS — L308 Other specified dermatitis: Secondary | ICD-10-CM

## 2021-08-22 DIAGNOSIS — H00014 Hordeolum externum left upper eyelid: Secondary | ICD-10-CM

## 2021-08-22 DIAGNOSIS — D229 Melanocytic nevi, unspecified: Secondary | ICD-10-CM

## 2021-08-22 DIAGNOSIS — D2271 Melanocytic nevi of right lower limb, including hip: Secondary | ICD-10-CM | POA: Diagnosis not present

## 2021-08-22 NOTE — Patient Instructions (Signed)

## 2021-08-22 NOTE — Progress Notes (Signed)
   Follow-Up Visit   Subjective  Amanda Snyder is a 26 y.o. female who presents for the following: Follow-up (6 months f/u on mole on the right tip of 2nd toe, pt mom report no changes, pt mom report this mole has been on her toe since birth. ). Pt mom concerned about a new spot on the left upper eyelid and right arm.   Mother with patient contributing to history   The following portions of the chart were reviewed this encounter and updated as appropriate:   Tobacco  Allergies  Meds  Problems  Med Hx  Surg Hx  Fam Hx     Review of Systems:  No other skin or systemic complaints except as noted in HPI or Assessment and Plan.  Objective  Well appearing patient in no apparent distress; mood and affect are within normal limits.  A focused examination was performed including face,right toe, right arm. Relevant physical exam findings are noted in the Assessment and Plan.  right tip of 2nd toe 0.7 x 0.4 cm brown macule     left upper lateral eyelid 0.5 x 0.3 papule within the eyelid  Right Upper Arm - Anterior Mild dry patch    Assessment & Plan  Nevus right tip of 2nd toe Nevus -patient's mother states its been there since birth without changes.  There is a slight irregularity.  Recommend rechecking on follow-up with photos today and if change at any point the future recommend removal. Benign-appearing.  Observation.  Call clinic for new or changing lesions.  Recommend daily use of broad spectrum spf 30+ sunscreen to sun-exposed areas.   Hordeolum externum of left upper eyelid left upper lateral eyelid Benign-appearing.  Observation.  Call clinic for new or changing lesions.  Recommend daily use of broad spectrum spf 30+ sunscreen to sun-exposed areas.   Discussed valuation by ophthalmologist if becomes inflamed or symptomatic or larger.  Other eczema Right Upper Arm - Anterior Atopic dermatitis (eczema) is a chronic, relapsing, pruritic condition that can significantly  affect quality of life. It is often associated with allergic rhinitis and/or asthma and can require treatment with topical medications, phototherapy, or in severe cases biologic injectable medication (Dupixent; Adbry) or Oral JAK inhibitors.   Use otc hydrocortisone cream qd prn   Return in about 6 months (around 02/22/2022) for recheck nevus .  IMarye Round, CMA, am acting as scribe for Sarina Ser, MD .  Documentation: I have reviewed the above documentation for accuracy and completeness, and I agree with the above.  Sarina Ser, MD

## 2021-09-01 ENCOUNTER — Encounter: Payer: Self-pay | Admitting: Dermatology

## 2021-10-18 ENCOUNTER — Other Ambulatory Visit: Payer: Self-pay

## 2021-10-18 ENCOUNTER — Encounter: Payer: Self-pay | Admitting: Family Medicine

## 2021-10-18 DIAGNOSIS — E119 Type 2 diabetes mellitus without complications: Secondary | ICD-10-CM

## 2021-10-18 MED ORDER — METFORMIN HCL ER 500 MG PO TB24: ORAL_TABLET | ORAL | 1 refills | Status: DC

## 2021-10-18 NOTE — Telephone Encounter (Signed)
I called [facility] & they stated that metformin was bale to be filled. It would be ready in approximately one hour. I called patient's mom to let her know this. She wanted to make sure she hd not hurt patient by giving her expired metformin & I explained no, but that medication loses its potency once expired. She was advise to let us know if patient's blood sugars increased or did not go back to ger her normal to let us know.

## 2021-10-18 NOTE — Telephone Encounter (Signed)
Patient mother called about mychart message below.

## 2021-10-25 ENCOUNTER — Encounter: Payer: Self-pay | Admitting: Family Medicine

## 2021-11-08 ENCOUNTER — Other Ambulatory Visit: Payer: Self-pay

## 2021-11-08 ENCOUNTER — Other Ambulatory Visit: Payer: Self-pay | Admitting: Family Medicine

## 2021-11-08 ENCOUNTER — Encounter: Payer: Self-pay | Admitting: Family Medicine

## 2021-11-08 DIAGNOSIS — E119 Type 2 diabetes mellitus without complications: Secondary | ICD-10-CM

## 2021-11-08 DIAGNOSIS — I10 Essential (primary) hypertension: Secondary | ICD-10-CM

## 2021-11-08 MED ORDER — METFORMIN HCL ER 500 MG PO TB24: 1500.0000 mg | ORAL_TABLET | Freq: Every day | ORAL | 1 refills | Status: DC

## 2021-11-09 NOTE — Telephone Encounter (Signed)
Please contact the pharmacy and see what the issue is with this. They should not have to hold it as it was a dose change.

## 2022-01-29 ENCOUNTER — Encounter: Payer: Self-pay | Admitting: Family Medicine

## 2022-01-29 ENCOUNTER — Ambulatory Visit (INDEPENDENT_AMBULATORY_CARE_PROVIDER_SITE_OTHER): Payer: 59 | Admitting: Family Medicine

## 2022-01-29 VITALS — BP 110/70 | HR 83 | Temp 99.1°F | Ht 62.0 in | Wt 272.0 lb

## 2022-01-29 DIAGNOSIS — F72 Severe intellectual disabilities: Secondary | ICD-10-CM

## 2022-01-29 DIAGNOSIS — G40909 Epilepsy, unspecified, not intractable, without status epilepticus: Secondary | ICD-10-CM

## 2022-01-29 DIAGNOSIS — D509 Iron deficiency anemia, unspecified: Secondary | ICD-10-CM

## 2022-01-29 DIAGNOSIS — E119 Type 2 diabetes mellitus without complications: Secondary | ICD-10-CM | POA: Diagnosis not present

## 2022-01-29 DIAGNOSIS — D75839 Thrombocytosis, unspecified: Secondary | ICD-10-CM | POA: Diagnosis not present

## 2022-01-29 DIAGNOSIS — L219 Seborrheic dermatitis, unspecified: Secondary | ICD-10-CM

## 2022-01-29 DIAGNOSIS — E559 Vitamin D deficiency, unspecified: Secondary | ICD-10-CM

## 2022-01-29 HISTORY — DX: Epilepsy, unspecified, not intractable, without status epilepticus: G40.909

## 2022-01-29 NOTE — Assessment & Plan Note (Signed)
Patient's mother reports her scalp issues have resolved at this time.

## 2022-01-29 NOTE — Assessment & Plan Note (Signed)
Check iron levels

## 2022-01-29 NOTE — Assessment & Plan Note (Signed)
We will check A1c.  She will continue metformin XR 1000-1500 mg daily.  I encouraged her to see her eye doctor.

## 2022-01-29 NOTE — Assessment & Plan Note (Signed)
Check CBC 

## 2022-01-29 NOTE — Progress Notes (Signed)
Amanda Rumps, MD Phone: [phone]  Ahley Bulls is a 26 y.o. female who presents today for f/u.  DIABETES Disease Monitoring: Blood Sugar ranges-93 this morning, typically similar to that Polyuria/phagia/dipsia- no      Optho- due soon Medications: Compliance- taking metformin XR 1000-1500 mg, patients mother gives her 1500 mg on days she eats something sugary Hypoglycemic symptoms- no   Social History   Tobacco Use  Smoking Status Never  Smokeless Tobacco Never    Current Outpatient Medications on File Prior to Visit  Medication Sig Dispense Refill   blood glucose meter kit and supplies KIT Dispense one touch ultra. Use 3 times daily as directed. (FOR ICD-10 E11.9). 1 each 0   EPINEPHrine (EPIPEN 2-PAK) 0.3 mg/0.3 mL IJ SOAJ injection Inject 0.3 mLs (0.3 mg total) into the muscle once as needed (anaphylaxis). 1 Device 0   glucose blood test strip Dispense one touch ultra. Use 3 times daily as directed. (FOR ICD-10 E11.9). 100 each 12   Lancets (ONETOUCH DELICA PLUS [id number]) MISC Apply topically 3 (three) times daily.     metFORMIN (GLUCOPHAGE-XR) 500 MG 24 hr tablet Take 3 tablets (1,500 mg total) by mouth daily with breakfast. 270 tablet 1   metoprolol tartrate (LOPRESSOR) 50 MG tablet TAKE 1 TABLET BY MOUTH TWICE A DAY 180 tablet 1   No current facility-administered medications on file prior to visit.     ROS see history of present illness  Objective  Physical Exam Vitals:   01/29/22 1142  BP: 110/70  Pulse: 83  Temp: 99.1 F (37.3 C)  SpO2: 97%    BP Readings from Last 3 Encounters:  01/29/22 110/70  07/31/21 130/70  01/31/21 110/76   Wt Readings from Last 3 Encounters:  01/29/22 272 lb (123.4 kg)  07/31/21 282 lb (127.9 kg)  01/31/21 269 lb 6.4 oz (122.2 kg)    Physical Exam Constitutional:      General: She is not in acute distress.    Appearance: She is not diaphoretic.  Cardiovascular:     Rate and Rhythm: Normal rate and regular  rhythm.     Heart sounds: Normal heart sounds.  Pulmonary:     Effort: Pulmonary effort is normal.     Breath sounds: Normal breath sounds.  Skin:    General: Skin is warm and dry.  Neurological:     Mental Status: She is alert.    Diabetic Foot Exam - Simple   Simple Foot Form Visual Inspection No deformities, no ulcerations, no other skin breakdown bilaterally: Yes Sensation Testing See comments: Yes Pulse Check Posterior Tibialis and Dorsalis pulse intact bilaterally: Yes Comments Patient's developmental disability limited sensation testing as she did not respond when asked if she felt light touch or monofilament sensation though her feet seemed to react as though she was feeling      Assessment/Plan: Please see individual problem list.  Problem List Items Addressed This Visit     Severe intellectual disabilities (Chronic)    Seemingly doing well at this time.  This does limit our ability to complete certain exam components and health maintenance activities.      Type 2 diabetes mellitus without complications (HCC) - Primary (Chronic)    We will check A1c.  She will continue metformin XR 1000-1500 mg daily.  I encouraged her to see her eye doctor.      Relevant Orders   Comp Met (CMET)   HgB A1c   Lipid panel   Urine Microalbumin w/creat. ratio  Iron deficiency anemia    Check iron levels.      Relevant Orders   CBC   IBC + Ferritin   RESOLVED: Seborrheic dermatitis of scalp    Patient's mother reports her scalp issues have resolved at this time.      Thrombocytosis    Check CBC      Vitamin D deficiency    Check vitamin D.      Relevant Orders   Vitamin D (25 hydroxy)     Health Maintenance: Patient's mom will schedule ophthalmology visit.  She will also schedule with the patient's gynecologist for her Pap smear.  Return in about 6 months (around 07/31/2022) for DM.   Amanda Rumps, MD [place]

## 2022-01-29 NOTE — Assessment & Plan Note (Signed)
Check vitamin D. 

## 2022-01-29 NOTE — Assessment & Plan Note (Signed)
Seemingly doing well at this time.  This does limit our ability to complete certain exam components and health maintenance activities.

## 2022-02-13 ENCOUNTER — Ambulatory Visit: Payer: 59 | Admitting: Dermatology

## 2022-02-22 ENCOUNTER — Other Ambulatory Visit: Payer: 59

## 2022-02-22 DIAGNOSIS — E119 Type 2 diabetes mellitus without complications: Secondary | ICD-10-CM

## 2022-02-22 DIAGNOSIS — E559 Vitamin D deficiency, unspecified: Secondary | ICD-10-CM

## 2022-02-22 DIAGNOSIS — D509 Iron deficiency anemia, unspecified: Secondary | ICD-10-CM

## 2022-02-22 LAB — IBC + FERRITIN
Ferritin: 25.2 ng/mL (ref 10.0–291.0)
Iron: 20 ug/dL — ABNORMAL LOW (ref 42–145)
Saturation Ratios: 7.1 % — ABNORMAL LOW (ref 20.0–50.0)
TIBC: 280 ug/dL (ref 250.0–450.0)
Transferrin: 200 mg/dL — ABNORMAL LOW (ref 212.0–360.0)

## 2022-02-22 LAB — COMPREHENSIVE METABOLIC PANEL
ALT: 15 U/L (ref 0–35)
AST: 11 U/L (ref 0–37)
Albumin: 3.7 g/dL (ref 3.5–5.2)
Alkaline Phosphatase: 47 U/L (ref 39–117)
BUN: 12 mg/dL (ref 6–23)
CO2: 26 mEq/L (ref 19–32)
Calcium: 8.8 mg/dL (ref 8.4–10.5)
Chloride: 103 mEq/L (ref 96–112)
Creatinine, Ser: 0.52 mg/dL (ref 0.40–1.20)
GFR: 126.21 mL/min (ref >60.00)
Glucose, Bld: 116 mg/dL — ABNORMAL HIGH (ref 70–99)
Potassium: 3.6 mEq/L (ref 3.5–5.1)
Sodium: 135 mEq/L (ref 135–145)
Total Bilirubin: 0.3 mg/dL (ref 0.2–1.2)
Total Protein: 7.1 g/dL (ref 6.0–8.3)

## 2022-02-22 LAB — CBC
HCT: 36 % (ref 36.0–46.0)
Hemoglobin: 11.4 g/dL — ABNORMAL LOW (ref 12.0–15.0)
MCHC: 31.8 g/dL (ref 30.0–36.0)
MCV: 69.9 fl — ABNORMAL LOW (ref 78.0–100.0)
Platelets: 369 10*3/uL (ref 150.0–400.0)
RBC: 5.15 Mil/uL — ABNORMAL HIGH (ref 3.87–5.11)
RDW: 14.6 % (ref 11.5–15.5)
WBC: 10.7 10*3/uL — ABNORMAL HIGH (ref 4.0–10.5)

## 2022-02-22 LAB — LIPID PANEL
Cholesterol: 138 mg/dL (ref 0–200)
HDL: 45.2 mg/dL (ref >39.00)
LDL Cholesterol: 80 mg/dL (ref 0–99)
NonHDL: 92.39
Total CHOL/HDL Ratio: 3
Triglycerides: 61 mg/dL (ref 0.0–149.0)
VLDL: 12.2 mg/dL (ref 0.0–40.0)

## 2022-02-22 LAB — VITAMIN D 25 HYDROXY (VIT D DEFICIENCY, FRACTURES): VITD: 38.44 ng/mL (ref 30.00–100.00)

## 2022-02-22 LAB — HEMOGLOBIN A1C: Hgb A1c MFr Bld: 5.8 % (ref 4.6–6.5)

## 2022-02-27 ENCOUNTER — Other Ambulatory Visit: Payer: Self-pay | Admitting: Family Medicine

## 2022-02-27 DIAGNOSIS — N924 Excessive bleeding in the premenopausal period: Secondary | ICD-10-CM

## 2022-02-27 DIAGNOSIS — D500 Iron deficiency anemia secondary to blood loss (chronic): Secondary | ICD-10-CM

## 2022-02-27 MED ORDER — FERROUS SULFATE 325 (65 FE) MG PO TABS: 325.0000 mg | ORAL_TABLET | Freq: Every day | ORAL | 1 refills | Status: DC

## 2022-03-05 ENCOUNTER — Telehealth: Payer: Self-pay

## 2022-03-05 NOTE — Telephone Encounter (Signed)
LMTCB

## 2022-03-12 ENCOUNTER — Encounter: Payer: Self-pay | Admitting: *Deleted

## 2022-03-25 ENCOUNTER — Other Ambulatory Visit: Payer: Self-pay | Admitting: Family Medicine

## 2022-03-25 DIAGNOSIS — D500 Iron deficiency anemia secondary to blood loss (chronic): Secondary | ICD-10-CM

## 2022-05-09 ENCOUNTER — Other Ambulatory Visit: Payer: Self-pay | Admitting: Family Medicine

## 2022-05-09 DIAGNOSIS — I10 Essential (primary) hypertension: Secondary | ICD-10-CM

## 2022-05-09 DIAGNOSIS — E119 Type 2 diabetes mellitus without complications: Secondary | ICD-10-CM

## 2022-05-28 ENCOUNTER — Encounter: Payer: Self-pay | Admitting: Family Medicine

## 2022-05-29 NOTE — Telephone Encounter (Signed)
She needs an appointment ASAP.

## 2022-05-30 NOTE — Telephone Encounter (Signed)
Patient is scheduled for 05/31/22 at 11:00.

## 2022-05-31 ENCOUNTER — Encounter: Payer: Self-pay | Admitting: Family Medicine

## 2022-05-31 ENCOUNTER — Ambulatory Visit (INDEPENDENT_AMBULATORY_CARE_PROVIDER_SITE_OTHER): Payer: Medicare HMO | Admitting: Family Medicine

## 2022-05-31 VITALS — BP 126/70 | HR 93 | Temp 98.0°F | Resp 15 | Ht 62.0 in | Wt 269.0 lb

## 2022-05-31 DIAGNOSIS — D500 Iron deficiency anemia secondary to blood loss (chronic): Secondary | ICD-10-CM

## 2022-05-31 DIAGNOSIS — R55 Syncope and collapse: Secondary | ICD-10-CM | POA: Diagnosis not present

## 2022-05-31 LAB — COMPREHENSIVE METABOLIC PANEL
ALT: 14 U/L (ref 0–35)
AST: 11 U/L (ref 0–37)
Albumin: 3.8 g/dL (ref 3.5–5.2)
Alkaline Phosphatase: 53 U/L (ref 39–117)
BUN: 14 mg/dL (ref 6–23)
CO2: 26 mEq/L (ref 19–32)
Calcium: 9.3 mg/dL (ref 8.4–10.5)
Chloride: 102 mEq/L (ref 96–112)
Creatinine, Ser: 0.49 mg/dL (ref 0.40–1.20)
GFR: 127.79 mL/min (ref >60.00)
Glucose, Bld: 92 mg/dL (ref 70–99)
Potassium: 3.9 mEq/L (ref 3.5–5.1)
Sodium: 136 mEq/L (ref 135–145)
Total Bilirubin: 0.3 mg/dL (ref 0.2–1.2)
Total Protein: 6.7 g/dL (ref 6.0–8.3)

## 2022-05-31 LAB — IBC + FERRITIN
Ferritin: 22.1 ng/mL (ref 10.0–291.0)
Iron: 83 ug/dL (ref 42–145)
Saturation Ratios: 28.1 % (ref 20.0–50.0)
TIBC: 295.4 ug/dL (ref 250.0–450.0)
Transferrin: 211 mg/dL — ABNORMAL LOW (ref 212.0–360.0)

## 2022-05-31 LAB — CBC WITH DIFFERENTIAL/PLATELET
Basophils Absolute: 0 10*3/uL (ref 0.0–0.1)
Basophils Relative: 0.3 % (ref 0.0–3.0)
Eosinophils Absolute: 0.2 10*3/uL (ref 0.0–0.7)
Eosinophils Relative: 3.2 % (ref 0.0–5.0)
HCT: 39.2 % (ref 36.0–46.0)
Hemoglobin: 12.1 g/dL (ref 12.0–15.0)
Lymphocytes Relative: 35.4 % (ref 12.0–46.0)
Lymphs Abs: 2.3 10*3/uL (ref 0.7–4.0)
MCHC: 31 g/dL (ref 30.0–36.0)
MCV: 71.3 fl — ABNORMAL LOW (ref 78.0–100.0)
Monocytes Absolute: 0.5 10*3/uL (ref 0.1–1.0)
Monocytes Relative: 8.1 % (ref 3.0–12.0)
Neutro Abs: 3.5 10*3/uL (ref 1.4–7.7)
Neutrophils Relative %: 53 % (ref 43.0–77.0)
Platelets: 425 10*3/uL — ABNORMAL HIGH (ref 150.0–400.0)
RBC: 5.49 Mil/uL — ABNORMAL HIGH (ref 3.87–5.11)
RDW: 15.7 % — ABNORMAL HIGH (ref 11.5–15.5)
WBC: 6.6 10*3/uL (ref 4.0–10.5)

## 2022-05-31 LAB — TSH: TSH: 1.19 u[IU]/mL (ref 0.35–5.50)

## 2022-05-31 NOTE — Assessment & Plan Note (Addendum)
Very brief slumping over with no loss of consciousness.  It is difficult to know what this may have been.  It is possible her blood pressure could have gotten low with sitting for so long and being on her menstrual cycle.  It is also very possible this could represent seizure activity.  Unlikely related to an acute cardiac cause other than low blood pressure.  Today we will check lab work to evaluate for underlying cause.  If her labs are unremarkable we will refer to neurology for further evaluation.  Advised if she has any recurrent episodes they need to take her to the hospital for evaluation.

## 2022-05-31 NOTE — Progress Notes (Signed)
[name], MD Phone: [phone]  Amanda Snyder is a 27 y.o. female who presents today for same-day visit.  Collapse: Patient's mother gives all of the history.  The patient was having her hair done and had been in the chair for about 2 hours.  She suddenly fell forward and had a little shaking as she was slumped over.  Her mom got her situated in a more comfortable manner and the patient recovered back to normal within a few minutes.  The patient's mother notes she never lost consciousness.  There has been no complaint of chest pain or breathing issues.  She did have a bowel movement during the timeframe from when she collapsed to after EMS evaluation.  EMS did evaluate her and noted everything has recovered back to normal and she did not end up going to the emergency department.  His mother reported EMS to do EKG.  The patient's mother notes she was on her.  At the time and had been bleeding fairly heavily.  She is no longer on her menstrual cycle.  She has not had any additional issues with collapsing.  She does have a history of seizures though has not had a seizure since she was 32 months old.  Social History   Tobacco Use  Smoking Status Never  Smokeless Tobacco Never    Current Outpatient Medications on File Prior to Visit  Medication Sig Dispense Refill   blood glucose meter kit and supplies KIT Dispense one touch ultra. Use 3 times daily as directed. (FOR ICD-10 E11.9). 1 each 0   EPINEPHrine (EPIPEN 2-PAK) 0.3 mg/0.3 mL IJ SOAJ injection Inject 0.3 mLs (0.3 mg total) into the muscle once as needed (anaphylaxis). 1 Device 0   ferrous sulfate 325 (65 FE) MG tablet TAKE 1 TABLET BY MOUTH EVERY DAY WITH BREAKFAST 90 tablet 1   glucose blood test strip Dispense one touch ultra. Use 3 times daily as directed. (FOR ICD-10 E11.9). 100 each 12   Lancets (ONETOUCH DELICA PLUS [id number]) MISC Apply topically 3 (three) times daily.     metFORMIN (GLUCOPHAGE-XR) 500 MG 24 hr tablet TAKE  1 TABLET BY MOUTH EVERY DAY WITH BREAKFAST 90 tablet 1   metoprolol tartrate (LOPRESSOR) 50 MG tablet TAKE 1 TABLET BY MOUTH TWICE A DAY 180 tablet 1   No current facility-administered medications on file prior to visit.     ROS see history of present illness  Objective  Physical Exam Vitals:   05/31/22 1110  BP: 126/70  Pulse: 93  Resp: 15  Temp: 98 F (36.7 C)  SpO2: 99%    BP Readings from Last 3 Encounters:  05/31/22 126/70  01/29/22 110/70  07/31/21 130/70   Wt Readings from Last 3 Encounters:  05/31/22 269 lb (122 kg)  01/29/22 272 lb (123.4 kg)  07/31/21 282 lb (127.9 kg)    Physical Exam Constitutional:      General: She is not in acute distress.    Appearance: She is not diaphoretic.  Cardiovascular:     Rate and Rhythm: Normal rate and regular rhythm.     Heart sounds: Normal heart sounds.  Pulmonary:     Effort: Pulmonary effort is normal.     Breath sounds: Normal breath sounds.  Musculoskeletal:     Right lower leg: No edema.     Left lower leg: No edema.  Skin:    General: Skin is warm and dry.  Neurological:     Mental Status: She is alert.  Assessment/Plan: Please see individual problem list.  Collapse Assessment & Plan: Very brief slumping over with no loss of consciousness.  It is difficult to know what this may have been.  It is possible her blood pressure could have gotten low with sitting for so long and being on her menstrual cycle.  It is also very possible this could represent seizure activity.  Unlikely related to an acute cardiac cause other than low blood pressure.  Today we will check lab work to evaluate for underlying cause.  If her labs are unremarkable we will refer to neurology for further evaluation.  Advised if she has any recurrent episodes they need to take her to the hospital for evaluation.  Orders: -     IBC + Ferritin -     CBC with Differential/Platelet -     Comprehensive metabolic panel -     TSH  Iron  deficiency anemia due to chronic blood loss -     IBC + Ferritin -     CBC with Differential/Platelet   Return if symptoms worsen or fail to improve, for As scheduled.   [name], MD [place]

## 2022-05-31 NOTE — Patient Instructions (Signed)
Nice to see you. Will get lab work today and contact you with the results. If she has any recurrent episodes like this please seek medical attention in the emergency department.

## 2022-06-03 NOTE — Telephone Encounter (Signed)
Already addressed via lab results given by [facility]

## 2022-06-04 ENCOUNTER — Other Ambulatory Visit: Payer: Self-pay | Admitting: Family

## 2022-06-04 DIAGNOSIS — R55 Syncope and collapse: Secondary | ICD-10-CM

## 2022-06-07 ENCOUNTER — Other Ambulatory Visit: Payer: Self-pay | Admitting: Family Medicine

## 2022-06-07 DIAGNOSIS — E119 Type 2 diabetes mellitus without complications: Secondary | ICD-10-CM

## 2022-07-13 DIAGNOSIS — R030 Elevated blood-pressure reading, without diagnosis of hypertension: Secondary | ICD-10-CM | POA: Diagnosis not present

## 2022-07-13 DIAGNOSIS — R0989 Other specified symptoms and signs involving the circulatory and respiratory systems: Secondary | ICD-10-CM | POA: Diagnosis not present

## 2022-07-13 DIAGNOSIS — J069 Acute upper respiratory infection, unspecified: Secondary | ICD-10-CM | POA: Diagnosis not present

## 2022-07-13 DIAGNOSIS — H1013 Acute atopic conjunctivitis, bilateral: Secondary | ICD-10-CM | POA: Diagnosis not present

## 2022-07-23 ENCOUNTER — Encounter: Payer: Self-pay | Admitting: Neurology

## 2022-07-23 ENCOUNTER — Ambulatory Visit (INDEPENDENT_AMBULATORY_CARE_PROVIDER_SITE_OTHER): Payer: Medicare HMO | Admitting: Neurology

## 2022-07-23 VITALS — BP 115/81 | HR 88 | Ht 61.0 in | Wt 274.0 lb

## 2022-07-23 DIAGNOSIS — F72 Severe intellectual disabilities: Secondary | ICD-10-CM | POA: Diagnosis not present

## 2022-07-23 DIAGNOSIS — R404 Transient alteration of awareness: Secondary | ICD-10-CM

## 2022-07-23 HISTORY — DX: Transient alteration of awareness: R40.4

## 2022-07-23 NOTE — Progress Notes (Unsigned)
Chief Complaint  Patient presents with   New Patient (Initial Visit)    Rm13 mother present  Seizure: had on second week of feb and fell out of a chair while getting hair done      ASSESSMENT AND PLAN  Amanda Snyder is a 27 y.o. female   Probable seizure Mental retardation Emotional outburst  It is very difficult for patient to go through MRI and EEG evaluation,  Discussed with her mother, decided to continue observe her symptoms, call clinic for recurrent spells, we will proceed with more evaluation at that time, will not initiate antiepileptic medication now  DIAGNOSTIC DATA (LABS, IMAGING, TESTING) - I reviewed patient records, labs, notes, testing and imaging myself where available.   MEDICAL HISTORY:  Amanda Snyder, seen in request by   Allegra Grana, FNP [address] [place],  [place] [address], Glori Luis, MD   I reviewed and summarized the referring note. PMHX.  Amanda Snyder is a 27 years old right-handed female, accompanied by her mother, seen in refer by  her primary care physician Dr. Glori Luis, MD for evaluation of emotional outbursts, initial evaluation was on June 26 2016.   She is #3 of 4 siblings, there was no family history of mental retardation no epilepsy, she was born premature at 70 weeks, mother had difficulty during pregnancy recalls cerclage, prolonged vaginal delivery require induction.   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   She has significant mental retardation, able to walk at age 34, only able to perform simple sentence after taking years old, she went through special education, has stereotypical behavior, like to suck her thumb, she could not carry on normal conversation, but seems to understand conversation better than she could  express herself, over the years she developed this outburst of cursing episode, usually when she is in physical pain, upset, frustrated.   She woke up almost every morning around 4-5 AM, Outburst of emotional angry cursing, she will be soothed if she was given milk and orange juice.   She has no significant worsening of her mental capacity or behavior issues.   I reviewed laboratory evaluation March 2018, A1c was 6.1, CBC showed mild anemia hemoglobin 11 point 6, CMP showed elevated glucose 120, vitamin D was 24, ferritin was 23.5, normal TSH   May 27 2022, around theer, do chair, sit in chair for awhile, she fell, she was shking a billte bit, then stop, call paramedicsl, she has not has that for many years, paramedica came, vitals signs was fine, blood pressure was low,   Then shem came back to her normal self, refused to ED,   Scheduled PCP, run test, iron level was low, iron pill   Labs in 2024, normal TSH,  CMP, hg 11.4, A1C 5.8, Vit D38,   She has hsitory of sizure weeks old, whole body jerking for few weeks,  east Sunday, a month late, she has it every night, that day,  they start come more ferquent, 6am, she has another one, only a month old, that everning, she has episode lasign for 2 minutes, it was fiferent, eyes flicking, made noise, called paremedics,  living in [place] at the times, she was havig sizure,  PD for3 years, inflant, did nto have sizure after 6 months old, left [place], seeing Dr. Thurston Hole,  took her off,   She has those spells when she is fearful, upset, she does not get what she wants, pee on herself, wet soidl, someimtes, out of blue, fuck you, sometimes, she can hungery cramp, otburst,   Suday, got up, late, she did nto atten her, she wil curse, fellowship on line, oldes daughter came over, schedule was diffferent, cursings, she hate   Spell last 5 minutes, let her vent, go away,      Past Surgical History:  Procedure Laterality Date   NO PAST SURGERIES        PHYSICAL EXAM:   Vitals:   07/23/22 1348  BP: 115/81  Pulse: 88  SpO2: 97%  Weight: 274 lb (124.3 kg)  Height:  (1.549 m)   Not recorded     Body mass index is 51.77 kg/m.  PHYSICAL EXAMNIATION:  Gen: NAD, conversant, well nourised, well groomed                     Cardiovascular: Regular rate rhythm, no peripheral edema, warm, nontender. Eyes: Conjunctivae clear without exudates or hemorrhage Neck: Supple, no carotid bruits. Pulmonary: Clear to auscultation bilaterally   NEUROLOGICAL EXAM:  MENTAL STATUS: Speech/cognition: Quiet, did not call, follows simple command, frequent body rocking slapping on her legs CRANIAL NERVES: CN II: Visual fields are full to confrontation. Pupils are round equal and briskly reactive to light. CN III, IV, VI: extraocular movement are normal. No ptosis. CN V: Facial sensation is intact to light touch CN VII: Face is symmetric with normal eye closure  CN VIII: Hearing is normal to causal conversation. CN IX, X: Phonation is normal. CN XI: Head turning and shoulder shrug are intact  MOTOR: Moving 4 extremities without difficulty  REFLEXES: Reflexes are 1 and symmetric at the biceps, triceps, knees, and ankles. Plantar responses are flexor.  SENSORY: Withdraws to pain  COORDINATION: There is no trunk or limb dysmetria noted.  GAIT/STANCE: Able to get up from seated position pushing on chair arm, wide-based, cautious, limited by her big body habitus  REVIEW OF SYSTEMS:  Full 14 system review of systems performed and notable only for as above All other review of systems were negative.   ALLERGIES: Allergies  Allergen Reactions   Clams [Shellfish Allergy]     And shrimp   Other Other (See Comments)    Eyes and nose running    HOME MEDICATIONS: Current Outpatient Medications  Medication Sig Dispense Refill   blood glucose meter kit and supplies KIT Dispense one touch ultra. Use 3 times daily as directed. (FOR  ICD-10 E11.9). 1 each 0   EPINEPHrine (EPIPEN 2-PAK) 0.3 mg/0.3 mL IJ SOAJ injection Inject 0.3 mLs (0.3 mg total) into the muscle once as needed (anaphylaxis). 1 Device 0   ferrous sulfate 325 (65 FE) MG tablet TAKE 1 TABLET BY MOUTH EVERY DAY WITH BREAKFAST 90 tablet 1   glucose blood test strip Dispense one touch ultra. Use 3 times daily as directed. (FOR ICD-10 E11.9). 100 each 12   Lancets (ONETOUCH DELICA PLUS LANCET33G) MISC Apply topically 3 (three) times daily.     metFORMIN (GLUCOPHAGE-XR) 500 MG 24 hr tablet TAKE 3 TABLETS (1,500 MG TOTAL) BY MOUTH DAILY WITH BREAKFAST 270 tablet 1   metoprolol tartrate (LOPRESSOR) 50 MG tablet TAKE 1 TABLET BY MOUTH TWICE A DAY 180 tablet 1   No current facility-administered medications for this visit.    PAST MEDICAL HISTORY: Past Medical History:  Diagnosis Date  ADHD (attention deficit hyperactivity disorder) 2000   mom opted not to give medications (failed Adderall - 'zombielike', failed Strattera - 'no difference')   Allergy    suspected allergy to pollen or dog(s)   Anemia    Diabetes mellitus without complication    Hypertension    Mental retardation 1997   Severe MR. did not walk until age 44-3 years, received Early Intervention.   Nondisplaced fracture of proximal phalanx of right great toe, initial encounter for closed fracture 10/06/2017   Obesity 12   Premature birth with gestation of 35-36 weeks    [redacted] weeks GA   Seizure disorder 01/29/2022   Formatting of this note might be different from the original. as infant and on phenobarbital until 1998   Seizures From age 74 month to 6 months   took Phenobarbital for a few years. weaned off after moving from [place] to [place]. Unknown etiology.   Shortness of breath 08/16/2019   Syncope 2006   witnessed by sister, no workup    PAST SURGICAL HISTORY: Past Surgical History:  Procedure Laterality Date   NO PAST SURGERIES      FAMILY HISTORY: Family History  Problem Relation Age of Onset    Stroke Mother 25   Psoriasis Mother    Alcohol abuse Mother    Hyperlipidemia Mother    Mental retardation Mother    Hypertension Mother    Alcohol abuse Brother    Other Father        Died from gunshot wound.   Cancer Maternal Grandmother 48       breast ca   Breast cancer Maternal Grandmother    Alcohol abuse Maternal Grandfather    Diabetes Paternal Grandmother     SOCIAL HISTORY: Social History   Socioeconomic History   Marital status: Single    Spouse name: Not on file   Number of children: 0   Years of education: Not on file   Highest education level: Not on file  Occupational History   Occupation: Unemployed  Tobacco Use   Smoking status: Never   Smokeless tobacco: Never  Substance and Sexual Activity   Alcohol use: No    Alcohol/week: 0.0 standard drinks of alcohol   Drug use: No   Sexual activity: Not on file  Other Topics Concern   Not on file  Social History Narrative   Lives at home with mom and youngest sister (2 years younger).      Attends [employer] on [employer] - will age out at 19 years. 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 Has IEP. She will transition into a day program in June.      Mother maintains guardianship (beyond 26 years of age).      Right-handed.      1-2 caffeine drinks per week.      Social Determinants of Health   Financial Resource Strain: Not on file  Food Insecurity: Not on file  Transportation Needs: Not on file  Physical Activity: Not on file  Stress: Not on file  Social Connections: Not on file  Intimate Partner Violence: Not on file      Levert Feinstein, M.D. Ph.D.  [facility] [address], [address] [place], [place] [address] Ph: [phone] Fax: [phone]  CC:  Allegra Grana, FNP [address] [place],  [place] [address]  Glori Luis, MD

## 2022-07-31 ENCOUNTER — Ambulatory Visit (INDEPENDENT_AMBULATORY_CARE_PROVIDER_SITE_OTHER): Payer: Medicaid Other | Admitting: Family Medicine

## 2022-07-31 ENCOUNTER — Encounter: Payer: Self-pay | Admitting: Family Medicine

## 2022-07-31 VITALS — BP 110/70 | HR 87 | Temp 98.6°F | Resp 17 | Ht 61.0 in | Wt 272.2 lb

## 2022-07-31 DIAGNOSIS — J069 Acute upper respiratory infection, unspecified: Secondary | ICD-10-CM | POA: Diagnosis not present

## 2022-07-31 DIAGNOSIS — I10 Essential (primary) hypertension: Secondary | ICD-10-CM | POA: Diagnosis not present

## 2022-07-31 DIAGNOSIS — E119 Type 2 diabetes mellitus without complications: Secondary | ICD-10-CM | POA: Diagnosis not present

## 2022-07-31 DIAGNOSIS — D509 Iron deficiency anemia, unspecified: Secondary | ICD-10-CM

## 2022-07-31 LAB — POCT GLYCOSYLATED HEMOGLOBIN (HGB A1C): Hemoglobin A1C: 5.5 % (ref 4.0–5.6)

## 2022-07-31 NOTE — Assessment & Plan Note (Signed)
Symptoms have resolved.  Monitor for recurrence. 

## 2022-07-31 NOTE — Progress Notes (Signed)
Amanda Alar, MD Phone: [phone]  Amanda Snyder is a 27 y.o. female who presents today for f/u.  Patient's mother provides a history.  HYPERTENSION Disease Monitoring Home BP Monitoring not checking Chest pain- no    Dyspnea- no Medications Compliance-  taking metoprolol, though recently started back on this at one pill daily. BMET    Component Value Date/Time   NA 136 05/31/2022 1128   K 3.9 05/31/2022 1128   CL 102 05/31/2022 1128   CO2 26 05/31/2022 1128   GLUCOSE 92 05/31/2022 1128   BUN 14 05/31/2022 1128   CREATININE 0.49 05/31/2022 1128   CREATININE 0.63 06/20/2017 1618   CALCIUM 9.3 05/31/2022 1128   GFRNONAA >60 06/19/2019 0237   GFRAA >60 06/19/2019 0237   Diabetes: Currently taking metformin.  Not checking sugars.  Notes she has been eating a little bit more sugary stuff recently.  Iron deficiency/heavy menstrual cycles: Patient is taking iron when she is on her cycle.  Most recent labs showed improved hemoglobin.  She has not scheduled with gynecology yet to evaluate her heavy menstrual cycles.  Upper respiratory infection: Patient recently had an upper respiratory infection.  Her mother notes that she was evaluated at urgent care and was given an inhaler and an antibiotic.  Notes the symptoms have resolved at this time.  Social History   Tobacco Use  Smoking Status Never  Smokeless Tobacco Never    Current Outpatient Medications on File Prior to Visit  Medication Sig Dispense Refill   blood glucose meter kit and supplies KIT Dispense one touch ultra. Use 3 times daily as directed. (FOR ICD-10 E11.9). 1 each 0   EPINEPHrine (EPIPEN 2-PAK) 0.3 mg/0.3 mL IJ SOAJ injection Inject 0.3 mLs (0.3 mg total) into the muscle once as needed (anaphylaxis). 1 Device 0   ferrous sulfate 325 (65 FE) MG tablet TAKE 1 TABLET BY MOUTH EVERY DAY WITH BREAKFAST 90 tablet 1   glucose blood test strip Dispense one touch ultra. Use 3 times daily as directed. (FOR ICD-10  E11.9). 100 each 12   Lancets (ONETOUCH DELICA PLUS LANCET33G) MISC Apply topically 3 (three) times daily.     metFORMIN (GLUCOPHAGE-XR) 500 MG 24 hr tablet TAKE 3 TABLETS (1,500 MG TOTAL) BY MOUTH DAILY WITH BREAKFAST 270 tablet 1   metoprolol tartrate (LOPRESSOR) 50 MG tablet TAKE 1 TABLET BY MOUTH TWICE A DAY 180 tablet 1   No current facility-administered medications on file prior to visit.     ROS see history of present illness  Objective  Physical Exam Vitals:   07/31/22 1132  BP: 110/70  Pulse: 87  Resp: 17  Temp: 98.6 F (37 C)  SpO2: 98%    BP Readings from Last 3 Encounters:  07/31/22 110/70  07/23/22 115/81  05/31/22 126/70   Wt Readings from Last 3 Encounters:  07/31/22 272 lb 4 oz (123.5 kg)  07/23/22 274 lb (124.3 kg)  05/31/22 269 lb (122 kg)    Physical Exam Constitutional:      General: She is not in acute distress.    Appearance: She is not diaphoretic.  Cardiovascular:     Rate and Rhythm: Normal rate and regular rhythm.     Heart sounds: Normal heart sounds.  Pulmonary:     Effort: Pulmonary effort is normal.     Breath sounds: Normal breath sounds.  Musculoskeletal:     Right lower leg: No edema.     Left lower leg: No edema.  Skin:  General: Skin is warm and dry.  Neurological:     Mental Status: She is alert.      Assessment/Plan: Please see individual problem list.  Type 2 diabetes mellitus without complication, without long-term current use of insulin Assessment & Plan: Chronic issue.  A1c now in the normal range.  Patient will continue metformin XR 1500 mg daily.  Orders: -     POCT glycosylated hemoglobin (Hb A1C)  Hypertension, unspecified type Assessment & Plan: Chronic issue.  Adequately controlled today.  She will continue metoprolol 50 mg daily.  She follows up with cardiology next week.   Iron deficiency anemia, unspecified iron deficiency anemia type Assessment & Plan: Chronic issue.  Most recent labs  improved.  I encouraged him to get her to see gynecology as her heavy menstrual cycles are likely the cause of her iron deficiency.   Viral upper respiratory tract infection Assessment & Plan: Symptoms have resolved.  Monitor for recurrence.     Return in about 6 months (around 01/30/2023).   Amanda Alar, MD [facility]

## 2022-07-31 NOTE — Assessment & Plan Note (Signed)
Chronic issue.  Adequately controlled today.  She will continue metoprolol 50 mg daily.  She follows up with cardiology next week.

## 2022-07-31 NOTE — Assessment & Plan Note (Signed)
Chronic issue.  A1c now in the normal range.  Patient will continue metformin XR 1500 mg daily.

## 2022-07-31 NOTE — Assessment & Plan Note (Signed)
Chronic issue.  Most recent labs improved.  I encouraged him to get her to see gynecology as her heavy menstrual cycles are likely the cause of her iron deficiency.

## 2022-08-06 ENCOUNTER — Ambulatory Visit: Payer: Medicare HMO | Admitting: Neurology

## 2022-08-07 ENCOUNTER — Other Ambulatory Visit: Payer: Self-pay

## 2022-08-07 ENCOUNTER — Other Ambulatory Visit (INDEPENDENT_AMBULATORY_CARE_PROVIDER_SITE_OTHER): Payer: Medicare HMO

## 2022-08-07 ENCOUNTER — Ambulatory Visit (INDEPENDENT_AMBULATORY_CARE_PROVIDER_SITE_OTHER): Payer: Medicare HMO | Admitting: Cardiovascular Disease

## 2022-08-07 ENCOUNTER — Encounter (HOSPITAL_BASED_OUTPATIENT_CLINIC_OR_DEPARTMENT_OTHER): Payer: Self-pay | Admitting: Cardiovascular Disease

## 2022-08-07 VITALS — BP 102/75 | HR 93 | Ht 61.0 in | Wt 274.0 lb

## 2022-08-07 DIAGNOSIS — R002 Palpitations: Secondary | ICD-10-CM

## 2022-08-07 DIAGNOSIS — R55 Syncope and collapse: Secondary | ICD-10-CM

## 2022-08-07 DIAGNOSIS — I4711 Inappropriate sinus tachycardia, so stated: Secondary | ICD-10-CM | POA: Diagnosis not present

## 2022-08-07 NOTE — Addendum Note (Signed)
Addended by: Regis Bill B on: 08/07/2022 12:41 PM   Modules accepted: Orders

## 2022-08-07 NOTE — Assessment & Plan Note (Signed)
Encouraged her to increase her walking and exercise.

## 2022-08-07 NOTE — Progress Notes (Signed)
Cardiology Office Note   Date:  08/07/2022   ID:  Amanda Snyder, DOB 1995/10/28, MRN [id number]  PCP:  Glori Luis, MD  Cardiologist:   Chilton Si, MD   No chief complaint on file.  History of Present Illness: Amanda Snyder is a 27 y.o. female with inappropriate sinus tachycardia, developmental delay, seizures, syncope, DM, obesity, OSA not on CPAP, and hypertension here for follow-up.  She was initially seen for the evaluation of tachycardia.  She saw her PCP for episodes of hypertensive readings and tachycardia.  Her blood pressure can be as high as the 200s systolic.  Her heart rate was going into the 110s or 120s.  Her mother also reported episodes where she slid down the wall and look like she was going to pass out but did not.  During these times her blood pressure was low.  Her primary care doctor started her on metoprolol.    Amanda Snyder had an echo 09/2019 that revealed LVEF 60 to 65% with grade 1 diastolic dysfunction and was otherwise normal.  There is concern for pheochromocytoma so she had plasma metanephrines checked which were normal.    At her visit 10/2019 she was doing well.  Her heart rate was better controlled on metoprolol.  Today, she is accompanied by her mother to provide history. Her mother states she recently had a cold and her heart rate had elevated to 102 bpm. Her mother reports a syncopal episode of in late February. While her mother was doing her hair she fell to the ground and began shaking. EMS was called, but by the time they arrived she was sitting up and had regained consciousness. Her heart rate was low at the time. Her mother reports a previous seizure when she was 4 months old. She followed up with neurology and suspected she had a seizure. An EEG and MRI was advised. She denies any palpitations, chest pain, shortness of breath, or peripheral edema. No lightheadedness, headaches, orthopnea, or PND.  Past Medical History:  Diagnosis Date    ADHD (attention deficit hyperactivity disorder) 2000   mom opted not to give medications (failed Adderall - 'zombielike', failed Strattera - 'no difference')   Allergy    suspected allergy to pollen or dog(s)   Anemia    Diabetes mellitus without complication (HCC)    Hypertension    Mental retardation 1997   Severe MR. did not walk until age 32-3 years, received Early Intervention.   Nondisplaced fracture of proximal phalanx of right great toe, initial encounter for closed fracture 10/06/2017   Obesity 12   Premature birth with gestation of 35-36 weeks    [redacted] weeks GA   Seizure disorder (HCC) 01/29/2022   Formatting of this note might be different from the original. as infant and on phenobarbital until 1998   Seizures (HCC) From age 3 month to 6 months   took Phenobarbital for a few years. weaned off after moving from [place] to [place]. Unknown etiology.   Shortness of breath 08/16/2019   Syncope 2006   witnessed by sister, no workup    Past Surgical History:  Procedure Laterality Date   NO PAST SURGERIES       Current Outpatient Medications  Medication Sig Dispense Refill   blood glucose meter kit and supplies KIT Dispense one touch ultra. Use 3 times daily as directed. (FOR ICD-10 E11.9). 1 each 0   EPINEPHrine (EPIPEN 2-PAK) 0.3 mg/0.3 mL IJ SOAJ injection Inject 0.3 mLs (0.3 mg  total) into the muscle once as needed (anaphylaxis). 1 Device 0   ferrous sulfate 325 (65 FE) MG tablet TAKE 1 TABLET BY MOUTH EVERY DAY WITH BREAKFAST 90 tablet 1   glucose blood test strip Dispense one touch ultra. Use 3 times daily as directed. (FOR ICD-10 E11.9). 100 each 12   Lancets (ONETOUCH DELICA PLUS LANCET33G) MISC Apply topically 3 (three) times daily.     metFORMIN (GLUCOPHAGE-XR) 500 MG 24 hr tablet TAKE 3 TABLETS (1,500 MG TOTAL) BY MOUTH DAILY WITH BREAKFAST 270 tablet 1   metoprolol tartrate (LOPRESSOR) 50 MG tablet TAKE 1 TABLET BY MOUTH TWICE A DAY 180 tablet 1   No current facility-administered  medications for this visit.    Allergies:   Clams [shellfish allergy] and Other    Social History:  The patient  reports that she has never smoked. She has never used smokeless tobacco. She reports that she does not drink alcohol and does not use drugs.   Family History:  The patient's family history includes Alcohol abuse in her brother, maternal grandfather, and mother; Breast cancer in her maternal grandmother; Cancer (age of onset: 75) in her maternal grandmother; Diabetes in her paternal grandmother; Hyperlipidemia in her mother; Hypertension in her mother; Mental retardation in her mother; Other in her father; Psoriasis in her mother; Stroke (age of onset: 18) in her mother.    ROS:  Please see the history of present illness.    (+) syncope All other systems are reviewed and negative.    PHYSICAL EXAM: VS:  BP 102/75 (BP Location: Right Arm, Patient Position: Sitting, Cuff Size: Large)   Pulse 93   Ht 5\' 1"  (1.549 m)   Wt 274 lb (124.3 kg)   LMP 06/24/2022 (Exact Date)   BMI 51.77 kg/m  , BMI Body mass index is 51.77 kg/m. GENERAL:  Well appearing.  Minimally verbal HEENT:  Pupils equal round and reactive, fundi not visualized, oral mucosa unremarkable NECK:  No jugular venous distention, waveform within normal limits, carotid upstroke brisk and symmetric, no bruits LUNGS:  Clear to auscultation bilaterally HEART:  RRR.  PMI not displaced or sustained,S1 and S2 within normal limits, no S3, no S4, no clicks, no rubs, no murmurs ABD:  Flat, positive bowel sounds normal in frequency in pitch, no bruits, no rebound, no guarding, no midline pulsatile mass, no hepatomegaly, no splenomegaly EXT:  2 plus pulses throughout, no edema, no cyanosis no clubbing SKIN:  No rashes no nodules NEURO:  Cranial nerves II through XII grossly intact.  Moves all 4 extremities freely. PSYCH:  Unable to assess.     EKG:  EKG is ordered today.  08/07/2022: Sinus rhythm. Rate 93 bpm. The ekg ordered  06/14/19: Sinus tachycardia.  Rate 111 bpm.  QTc 524 ms.  LAFB.  Echo 09/07/2019:  1. Left ventricular ejection fraction, by estimation, is 60 to 65%. The  left ventricle has normal function. The left ventricle has no regional  wall motion abnormalities. Left ventricular diastolic parameters are  consistent with Grade I diastolic  dysfunction (impaired relaxation).   2. Right ventricular systolic function is normal. The right ventricular  size is normal.   3. The mitral valve is normal in structure. No evidence of mitral valve  regurgitation. No evidence of mitral stenosis.   4. The aortic valve is normal in structure. Aortic valve regurgitation is  not visualized. No aortic stenosis is present.   5. The inferior vena cava is normal in size with greater  than 50%  respiratory variability, suggesting right atrial pressure of 3 mmHg.   Recent Labs: 05/31/2022: ALT 14; BUN 14; Creatinine, Ser 0.49; Hemoglobin 12.1; Platelets 425.0; Potassium 3.9; Sodium 136; TSH 1.19    Lipid Panel    Component Value Date/Time   CHOL 138 02/22/2022 1119   TRIG 61.0 02/22/2022 1119   HDL 45.20 02/22/2022 1119   CHOLHDL 3 02/22/2022 1119   VLDL 12.2 02/22/2022 1119   LDLCALC 80 02/22/2022 1119   LDLCALC 86 01/01/2017 0843      Wt Readings from Last 3 Encounters:  08/07/22 274 lb (124.3 kg)  07/31/22 272 lb 4 oz (123.5 kg)  07/23/22 274 lb (124.3 kg)      ASSESSMENT AND PLAN:  Inappropriate sinus tachycardia She has episodes of inappropriate sinus tachycardia.  This seems to have been better lately, though her mom hasn't been checking her heart rate as frequently lately.  She did note mild tachycardia when she had a cold, which is appropriate.  Continue to hold metoprolol.  We will get a 3 day Zio monitor to assess and if she needs it, change metoprolol to succinate.   Morbid obesity Encouraged her to increase her walking and exercise.   Syncope Syncope vs. Seizure. She is being followed by  neurology as well.  Prior echo was without any concern for structural heart disease.  She has no heart failure signs or symptoms.  We will get a 3 day Zio.  Labs are unremarkable.     Current medicines are reviewed at length with the patient today.  The patient does not have concerns regarding medicines.  The following changes have been made: Reduce metoprolol to 25 mg twice daily.  Labs/ tests ordered today include:   Orders Placed This Encounter  Procedures   LONG TERM MONITOR (3-14 DAYS)   EKG 12-Lead     Disposition:    - FU with Gillian Shields, NP in 2-3 months.  - Hold metoprolol - 3 day Zio monitor   I,Rachel Rivera,acting as a scribe for Chilton Si, MD.,have documented all relevant documentation on the behalf of Chilton Si, MD,as directed by  Chilton Si, MD while in the presence of Chilton Si, MD.  I, Raheen Capili C. Duke Salvia, MD have reviewed all documentation for this visit.  The documentation of the exam, diagnosis, procedures, and orders on 08/07/2022 are all accurate and complete.   Signed, Tocarra Gassen C. Duke Salvia, MD, [facility]  08/07/2022 12:34 PM    [facility]

## 2022-08-07 NOTE — Assessment & Plan Note (Signed)
Syncope vs. Seizure. She is being followed by neurology as well.  Prior echo was without any concern for structural heart disease.  She has no heart failure signs or symptoms.  We will get a 3 day Zio.  Labs are unremarkable.

## 2022-08-07 NOTE — Patient Instructions (Addendum)
Medication Instructions:  REMAIN OFF METOPROLOL   *If you need a refill on your cardiac medications before your next appointment, please call your pharmacy*  Lab Work: NONE  Testing/Procedures: 3 DAY ZIO  Follow-Up: At [facility], you and your health needs are our priority.  As part of our continuing mission to provide you with exceptional heart care, we have created designated Provider Care Teams.  These Care Teams include your primary Cardiologist (physician) and Advanced Practice Providers (APPs -  Physician Assistants and Nurse Practitioners) who all work together to provide you with the care you need, when you need it.  We recommend signing up for the patient portal called "MyChart".  Sign up information is provided on this After Visit Summary.  MyChart is used to connect with patients for Virtual Visits (Telemedicine).  Patients are able to view lab/test results, encounter notes, upcoming appointments, etc.  Non-urgent messages can be sent to your provider as well.   To learn more about what you can do with MyChart, go to [email or web address].    Your next appointment:   3 month(s)  Provider:   Gillian Shields, NP   Other Instructions  Christena Deem- Long Term Monitor Instructions  Your physician has requested you wear a ZIO patch monitor for 14 days.  This is a single patch monitor. [employer] supplies one patch monitor per enrollment. Additional stickers are not available. Please do not apply patch if you will be having a Nuclear Stress Test,  Echocardiogram, Cardiac CT, MRI, or Chest Xray during the period you would be wearing the  monitor. The patch cannot be worn during these tests. You cannot remove and re-apply the  ZIO XT patch monitor.  Your ZIO patch monitor will be mailed 3 day USPS to your address on file. It may take 3-5 days  to receive your monitor after you have been enrolled.  Once you have received your monitor, please review the enclosed instructions.  Your monitor  has already been registered assigning a specific monitor serial # to you.  Billing and Patient Assistance Program Information  We have supplied [employer] with any of your insurance information on file for billing purposes. [employer] offers a sliding scale Patient Assistance Program for patients that do not have  insurance, or whose insurance does not completely cover the cost of the ZIO monitor.  You must apply for the Patient Assistance Program to qualify for this discounted rate.  To apply, please call [employer] at [phone], select option 4, select option 2, ask to apply for  Patient Assistance Program. Meredeth Ide will ask your household income, and how many people  are in your household. They will quote your out-of-pocket cost based on that information.  [employer] will also be able to set up a 73-month, interest-free payment plan if needed.  Applying the monitor   Shave hair from upper left chest.  Hold abrader disc by orange tab. Rub abrader in 40 strokes over the upper left chest as  indicated in your monitor instructions.  Clean area with 4 enclosed alcohol pads. Let dry.  Apply patch as indicated in monitor instructions. Patch will be placed under collarbone on left  side of chest with arrow pointing upward.  Rub patch adhesive wings for 2 minutes. Remove white label marked "1". Remove the white  label marked "2". Rub patch adhesive wings for 2 additional minutes.  While looking in a mirror, press and release button in center of patch. A small green light will  flash 3-4  times. This will be your only indicator that the monitor has been turned on.  Do not shower for the first 24 hours. You may shower after the first 24 hours.  Press the button if you feel a symptom. You will hear a small click. Record Date, Time and  Symptom in the Patient Logbook.  When you are ready to remove the patch, follow instructions on the last 2 pages of Patient  Logbook. Stick patch monitor onto  the last page of Patient Logbook.  Place Patient Logbook in the blue and white box. Use locking tab on box and tape box closed  securely. The blue and white box has prepaid postage on it. Please place it in the mailbox as  soon as possible. Your physician should have your test results approximately 7 days after the  monitor has been mailed back to [facility].  Call [facility] Customer Care at [phone] if you have questions regarding  your ZIO XT patch monitor. Call them immediately if you see an orange light blinking on your  monitor.  If your monitor falls off in less than 4 days, contact our Monitor department at [phone].  If your monitor becomes loose or falls off after 4 days call [employer] at [phone] for  suggestions on securing your monitor

## 2022-08-07 NOTE — Assessment & Plan Note (Signed)
She has episodes of inappropriate sinus tachycardia.  This seems to have been better lately, though her mom hasn't been checking her heart rate as frequently lately.  She did note mild tachycardia when she had a cold, which is appropriate.  Continue to hold metoprolol.  We will get a 3 day Zio monitor to assess and if she needs it, change metoprolol to succinate.

## 2022-08-15 DIAGNOSIS — R002 Palpitations: Secondary | ICD-10-CM | POA: Diagnosis not present

## 2022-08-27 ENCOUNTER — Encounter (HOSPITAL_BASED_OUTPATIENT_CLINIC_OR_DEPARTMENT_OTHER): Payer: Self-pay | Admitting: Cardiovascular Disease

## 2022-10-02 MED ORDER — METOPROLOL SUCCINATE ER 50 MG PO TB24: 50.0000 mg | ORAL_TABLET | Freq: Every day | ORAL | 3 refills | Status: DC

## 2022-10-25 ENCOUNTER — Other Ambulatory Visit: Payer: Self-pay | Admitting: Family Medicine

## 2022-10-25 DIAGNOSIS — D500 Iron deficiency anemia secondary to blood loss (chronic): Secondary | ICD-10-CM

## 2022-10-30 ENCOUNTER — Ambulatory Visit (INDEPENDENT_AMBULATORY_CARE_PROVIDER_SITE_OTHER): Payer: Medicare HMO | Admitting: Dermatology

## 2022-10-30 DIAGNOSIS — Z7189 Other specified counseling: Secondary | ICD-10-CM

## 2022-10-30 DIAGNOSIS — D2271 Melanocytic nevi of right lower limb, including hip: Secondary | ICD-10-CM

## 2022-10-30 NOTE — Patient Instructions (Signed)

## 2022-10-30 NOTE — Progress Notes (Signed)
   Follow-Up Visit   Subjective  Amanda Snyder is a 27 y.o. female who presents for the following: recheck nevus at tip of right 2nd toe. Patient's mother advises there has been no change.   Patient accompanied by mother who contributes to history.  The following portions of the chart were reviewed this encounter and updated as appropriate: medications, allergies, medical history  Review of Systems:  No other skin or systemic complaints except as noted in HPI or Assessment and Plan.  Objective  Well appearing patient in no apparent distress; mood and affect are within normal limits.   A focused examination was performed of the following areas: Right foot.  Relevant exam findings are noted in the Assessment and Plan.    Assessment & Plan   NEVUS  Exam: 035 x 0.3 cm brown macule at tip of right 2nd toe  Nevus -patient's mother states its been there since birth without changes.  There is a slight irregularity. Recommend rechecking on follow-up with photos today and if change at any point the future recommend removal.  Treatment Plan: Benign-appearing. Stable compared to previous visit. Observation.  Call clinic for new or changing moles.  Recommend daily use of broad spectrum spf 30+ sunscreen to sun-exposed areas.      Return in about 4 months (around 03/02/2023) for recheck nevus.  Anise Salvo, RMA, am acting as scribe for Armida Sans, MD .   Documentation: I have reviewed the above documentation for accuracy and completeness, and I agree with the above.  Armida Sans, MD

## 2022-11-03 ENCOUNTER — Encounter: Payer: Self-pay | Admitting: Dermatology

## 2022-11-19 ENCOUNTER — Ambulatory Visit (INDEPENDENT_AMBULATORY_CARE_PROVIDER_SITE_OTHER): Payer: Medicare HMO | Admitting: Family

## 2022-11-19 ENCOUNTER — Encounter (HOSPITAL_BASED_OUTPATIENT_CLINIC_OR_DEPARTMENT_OTHER): Payer: Self-pay | Admitting: Family

## 2022-11-19 VITALS — BP 118/82 | HR 86 | Ht 61.0 in | Wt 277.0 lb

## 2022-11-19 DIAGNOSIS — I10 Essential (primary) hypertension: Secondary | ICD-10-CM | POA: Diagnosis not present

## 2022-11-19 DIAGNOSIS — I4711 Inappropriate sinus tachycardia, so stated: Secondary | ICD-10-CM

## 2022-11-19 NOTE — Patient Instructions (Signed)
Medication Instructions:  Your physician recommends that you continue on your current medications as directed. Please refer to the Current Medication list given to you today.  *If you need a refill on your cardiac medications before your next appointment, please call your pharmacy*  Follow-Up: At [facility], you and your health needs are our priority.  As part of our continuing mission to provide you with exceptional heart care, we have created designated Provider Care Teams.  These Care Teams include your primary Cardiologist (physician) and Advanced Practice Providers (APPs -  Physician Assistants and Nurse Practitioners) who all work together to provide you with the care you need, when you need it.  We recommend signing up for the patient portal called "MyChart".  Sign up information is provided on this After Visit Summary.  MyChart is used to connect with patients for Virtual Visits (Telemedicine).  Patients are able to view lab/test results, encounter notes, upcoming appointments, etc.  Non-urgent messages can be sent to your provider as well.   To learn more about what you can do with MyChart, go to [email or web address].    Your next appointment:   1 year with Dr. Jacques Navy or Gillian Shields, NP

## 2022-11-20 ENCOUNTER — Other Ambulatory Visit: Payer: Self-pay | Admitting: Family Medicine

## 2022-11-20 DIAGNOSIS — I10 Essential (primary) hypertension: Secondary | ICD-10-CM

## 2022-11-21 ENCOUNTER — Encounter (HOSPITAL_BASED_OUTPATIENT_CLINIC_OR_DEPARTMENT_OTHER): Payer: Self-pay | Admitting: Family

## 2022-11-21 NOTE — Progress Notes (Signed)
Cardiology Office Note:  .   Date:  11/21/2022  ID:  Amanda Snyder, DOB 07/22/1995, MRN [id number] PCP: Glori Luis, MD  [facility] Providers Cardiologist:  Chilton Si, MD    History of Present Illness: Marland Kitchen   Amanda Snyder is a 27 y.o. female with hx of in appropriate sinus tachycardia, developmental delay, seizures, syncope, DM, obesity, OSA not on CPAP, hypertension.   Established with Dr. Duke Salvia for hypertension and tachycardia. Echo 09/2019 LVEF 60-65%, gr1dd. Plasma metanephrines were normal. Seen 08/07/22 after episode during a cold with syncopal episode, fell to the ground, began shaking. Neurology suspected seizure, had previously had seizures as a child.3 day ZIO ordered  with predominantly sinus tachycardia average rage 108 bpm. Metoprolol tartrate was transitioned to Metoprolol succinate 50mg  daily.   Presents today for follow up with her mother who assists with history given her developmental delay. Tolerating Toprol without difficulty. No recurrent syncope-like episodes. Reports no shortness of breath nor dyspnea on exertion. Reports no chest pain, pressure, or tightness. No edema, orthopnea, PND. Reports no palpitations.  Monitoring BP/HR periodically at home, no recent concerns.   ROS: Please see the history of present illness.    All other systems reviewed and are negative.   Studies Reviewed: .        Cardiac Studies & Procedures       ECHOCARDIOGRAM  ECHOCARDIOGRAM LIMITED 09/07/2019  Narrative ECHOCARDIOGRAM LIMITED REPORT    Patient Name:   Amanda Snyder Date of Exam: 09/07/2019 Medical Rec #:  [id number]        Height:       62.0 in Accession #:    [id number]       Weight:       280.0 lb Date of Birth:  1996/04/07        BSA:          2.206 m Patient Age:    24 years         BP:           126/88 mmHg Patient Gender: F                HR:           84 bpm. Exam Location:  [address]  Procedure: 2D Echo, Limited Color Doppler and  Cardiac Doppler  Indications:    R00.0 Tachycardia R06.02 Shortness of breath  History:        Patient has no prior history of Echocardiogram examinations. ADHD. Syncope. Mental retardation. Seizures. Obstructive sleep apnea.  Sonographer:    Chanetta Marshall [facility], RDCS Referring Phys: [phone] [facility] [place]   Sonographer Comments: Technically difficult study due to poor echo windows. Limited echo secondary to mental capacity. IMPRESSIONS   1. Left ventricular ejection fraction, by estimation, is 60 to 65%. The left ventricle has normal function. The left ventricle has no regional wall motion abnormalities. Left ventricular diastolic parameters are consistent with Grade I diastolic dysfunction (impaired relaxation). 2. Right ventricular systolic function is normal. The right ventricular size is normal. 3. The mitral valve is normal in structure. No evidence of mitral valve regurgitation. No evidence of mitral stenosis. 4. The aortic valve is normal in structure. Aortic valve regurgitation is not visualized. No aortic stenosis is present. 5. The inferior vena cava is normal in size with greater than 50% respiratory variability, suggesting right atrial pressure of 3 mmHg.  FINDINGS Left Ventricle: Left ventricular ejection fraction, by estimation, is 60 to 65%. The left ventricle has  normal function. The left ventricle has no regional wall motion abnormalities. The left ventricular internal cavity size was normal in size. There is no left ventricular hypertrophy.  Right Ventricle: The right ventricular size is normal. No increase in right ventricular wall thickness. Right ventricular systolic function is normal.  Left Atrium: Left atrial size was normal in size.  Right Atrium: Right atrial size was normal in size.  Pericardium: There is no evidence of pericardial effusion.  Mitral Valve: The mitral valve is normal in structure. Normal mobility of the mitral valve leaflets. No evidence  of mitral valve stenosis.  Tricuspid Valve: The tricuspid valve is normal in structure. Tricuspid valve regurgitation is not demonstrated. No evidence of tricuspid stenosis.  Aortic Valve: The aortic valve is normal in structure. Aortic valve regurgitation is not visualized. No aortic stenosis is present.  Pulmonic Valve: The pulmonic valve was normal in structure. Pulmonic valve regurgitation is not visualized. No evidence of pulmonic stenosis.  Aorta: The aortic root is normal in size and structure.  Venous: The inferior vena cava is normal in size with greater than 50% respiratory variability, suggesting right atrial pressure of 3 mmHg.  IAS/Shunts: No atrial level shunt detected by color flow Doppler.   LEFT VENTRICLE PLAX 2D LVIDd:         4.15 cm  Diastology LVIDs:         2.10 cm  LV e' lateral:   7.51 cm/s LV PW:         1.00 cm  LV E/e' lateral: 9.0 LV IVS:        0.70 cm  LV e' medial:    6.74 cm/s LVOT diam:     1.90 cm  LV E/e' medial:  10.0 LV SV:         48 LV SV Index:   22 LVOT Area:     2.84 cm   LEFT ATRIUM         Index LA diam:    3.05 cm 1.38 cm/m AORTIC VALVE LVOT Vmax:   92.70 cm/s LVOT Vmean:  66.700 cm/s LVOT VTI:    0.169 m  AORTA Ao Root diam: 3.50 cm  MITRAL VALVE MV Area (PHT): 2.27 cm    SHUNTS MV Decel Time: 334 msec    Systemic VTI:  0.17 m MV E velocity: 67.50 cm/s  Systemic Diam: 1.90 cm MV A velocity: 69.40 cm/s MV E/A ratio:  0.97  Donato Schultz MD Electronically signed by Donato Schultz MD Signature Date/Time: 09/07/2019/2:25:18 PM    Final    MONITORS  LONG TERM MONITOR (3-14 DAYS) 08/19/2022  Narrative 3 day Zio Monitor  Quality: Fair.  Baseline artifact. Predominant rhythm: sinus tachycardia Average heart rate: 108 bpm Max heart rate: 155 bpm Min heart rate: 67 bpm  Rare (<1%) PACs and PVCs   Tiffany C. Duke Salvia, MD, [facility] 09/27/2022 9:17 AM           Risk Assessment/Calculations:          Physical Exam:    VS:  BP 118/82   Pulse 86   Ht 5\' 1"  (1.549 m)   Wt 277 lb (125.6 kg)   BMI 52.34 kg/m    Wt Readings from Last 3 Encounters:  11/19/22 277 lb (125.6 kg)  08/07/22 274 lb (124.3 kg)  07/31/22 272 lb 4 oz (123.5 kg)    GEN: Well nourished, overweight, well developed in no acute distress NECK: No JVD; No carotid bruits CARDIAC: RRR, no murmurs, rubs,  gallops RESPIRATORY:  Clear to auscultation without rales, wheezing or rhonchi  ABDOMEN: Soft, non-tender, non-distended EXTREMITIES:  No edema; No deformity   ASSESSMENT AND PLAN: .    Inappropriate sinus tachycardia - Continue Toprol 50mg  daily.   Obesity - Weight loss via diet and exercise encouraged. Discussed the impact being overweight would have on cardiovascular risk.   HTN - BP well controlled. Continue current antihypertensive regimen.         Dispo: follow up in 1 year  Signed, Alver Sorrow, NP

## 2022-12-29 ENCOUNTER — Other Ambulatory Visit: Payer: Self-pay | Admitting: Family Medicine

## 2022-12-29 DIAGNOSIS — E119 Type 2 diabetes mellitus without complications: Secondary | ICD-10-CM

## 2023-01-31 ENCOUNTER — Ambulatory Visit: Payer: Medicare HMO | Admitting: Family Medicine

## 2023-02-03 ENCOUNTER — Ambulatory Visit (INDEPENDENT_AMBULATORY_CARE_PROVIDER_SITE_OTHER): Payer: Medicare HMO | Admitting: Family Medicine

## 2023-02-03 VITALS — BP 114/68 | HR 85 | Temp 98.1°F | Ht 61.0 in | Wt 279.8 lb

## 2023-02-03 DIAGNOSIS — I10 Essential (primary) hypertension: Secondary | ICD-10-CM

## 2023-02-03 DIAGNOSIS — G479 Sleep disorder, unspecified: Secondary | ICD-10-CM | POA: Diagnosis not present

## 2023-02-03 DIAGNOSIS — D75839 Thrombocytosis, unspecified: Secondary | ICD-10-CM | POA: Diagnosis not present

## 2023-02-03 DIAGNOSIS — E119 Type 2 diabetes mellitus without complications: Secondary | ICD-10-CM | POA: Diagnosis not present

## 2023-02-03 DIAGNOSIS — E1159 Type 2 diabetes mellitus with other circulatory complications: Secondary | ICD-10-CM | POA: Diagnosis not present

## 2023-02-03 LAB — MICROALBUMIN / CREATININE URINE RATIO
Creatinine,U: 81.5 mg/dL
Microalb Creat Ratio: 6.9 mg/g (ref 0.0–30.0)
Microalb, Ur: 5.6 mg/dL — ABNORMAL HIGH (ref 0.0–1.9)

## 2023-02-03 LAB — HEMOGLOBIN A1C: Hgb A1c MFr Bld: 5.9 % (ref 4.6–6.5)

## 2023-02-03 NOTE — Assessment & Plan Note (Signed)
Chronic issue.  Check A1c.  Continue metformin XR 1500 mg daily.

## 2023-02-03 NOTE — Assessment & Plan Note (Signed)
Chronic issue.  Adequately controlled.  She will continue metoprolol 50 mg daily.

## 2023-02-03 NOTE — Assessment & Plan Note (Signed)
Intermittent issues with trouble sleeping.  Suspect this is related to her intellectual disability.  Advised to monitor and if it worsens we could have her see a specialist for this.

## 2023-02-03 NOTE — Progress Notes (Signed)
Amanda Alar, MD Phone: [phone]  Amanda Snyder is a 27 y.o. female who presents today for follow-up.  Diabetes: Typically running in the 90s.  Taking metformin.  No polyuria or polydipsia.  Hypertension: Not checking blood pressures.  She is taking metoprolol.  No chest pain or shortness of breath.  Patient's mom notes at times she does stay up at night.  She not doing it as frequently as she used to do it.  At 1 point her mom gave her Benadryl to help her sleep when she was up for greater than 48 hours.  Social History   Tobacco Use  Smoking Status Never  Smokeless Tobacco Never    Current Outpatient Medications on File Prior to Visit  Medication Sig Dispense Refill   blood glucose meter kit and supplies KIT Dispense one touch ultra. Use 3 times daily as directed. (FOR ICD-10 E11.9). 1 each 0   EPINEPHrine (EPIPEN 2-PAK) 0.3 mg/0.3 mL IJ SOAJ injection Inject 0.3 mLs (0.3 mg total) into the muscle once as needed (anaphylaxis). 1 Device 0   ferrous sulfate 325 (65 FE) MG tablet TAKE 1 TABLET BY MOUTH EVERY DAY WITH BREAKFAST 90 tablet 1   glucose blood test strip Dispense one touch ultra. Use 3 times daily as directed. (FOR ICD-10 E11.9). 100 each 12   Lancets (ONETOUCH DELICA PLUS LANCET33G) MISC Apply topically 3 (three) times daily.     metFORMIN (GLUCOPHAGE-XR) 500 MG 24 hr tablet TAKE 3 TABLETS (1,500 MG TOTAL) BY MOUTH DAILY WITH BREAKFAST 270 tablet 1   metoprolol succinate (TOPROL-XL) 50 MG 24 hr tablet Take 1 tablet (50 mg total) by mouth daily. Take with or immediately following a meal. 90 tablet 3   No current facility-administered medications on file prior to visit.     ROS see history of present illness  Objective  Physical Exam Vitals:   02/03/23 1258  BP: 114/68  Pulse: 85  Temp: 98.1 F (36.7 C)  SpO2: 99%    BP Readings from Last 3 Encounters:  02/03/23 114/68  11/19/22 118/82  08/07/22 102/75   Wt Readings from Last 3 Encounters:   02/03/23 279 lb 12.8 oz (126.9 kg)  11/19/22 277 lb (125.6 kg)  08/07/22 274 lb (124.3 kg)    Physical Exam Constitutional:      General: She is not in acute distress.    Appearance: She is not diaphoretic.  Cardiovascular:     Rate and Rhythm: Normal rate and regular rhythm.     Heart sounds: Normal heart sounds.  Pulmonary:     Effort: Pulmonary effort is normal.     Breath sounds: Normal breath sounds.  Skin:    General: Skin is warm and dry.  Neurological:     Mental Status: She is alert.      Assessment/Plan: Please see individual problem list.  Thrombocytosis Assessment & Plan: Mild elevation on last check.  Discussed with patient's mother today.  Recheck today.  Orders: -     CBC  Type 2 diabetes mellitus without complication, without long-term current use of insulin (HCC) Assessment & Plan: Chronic issue.  Check A1c.  Continue metformin XR 1500 mg daily.  Orders: -     Hemoglobin A1c -     Microalbumin / creatinine urine ratio  Hypertension, unspecified type Assessment & Plan: Chronic issue.  Adequately controlled.  She will continue metoprolol 50 mg daily.   Sleeping difficulty Assessment & Plan: Intermittent issues with trouble sleeping.  Suspect this is related  to her intellectual disability.  Advised to monitor and if it worsens we could have her see a specialist for this.     Return in about 6 months (around 08/04/2023) for transfer of care.   Amanda Alar, MD [facility]

## 2023-02-03 NOTE — Assessment & Plan Note (Signed)
Mild elevation on last check.  Discussed with patient's mother today.  Recheck today.

## 2023-02-04 ENCOUNTER — Encounter: Payer: Self-pay | Admitting: Family Medicine

## 2023-02-04 DIAGNOSIS — D75839 Thrombocytosis, unspecified: Secondary | ICD-10-CM

## 2023-02-04 LAB — CBC
HCT: 38.4 % (ref 36.0–46.0)
Hemoglobin: 11.6 g/dL — ABNORMAL LOW (ref 12.0–15.0)
MCHC: 30.2 g/dL (ref 30.0–36.0)
MCV: 72.6 fL — ABNORMAL LOW (ref 78.0–100.0)
Platelets: 449 10*3/uL — ABNORMAL HIGH (ref 150.0–400.0)
RBC: 5.3 Mil/uL — ABNORMAL HIGH (ref 3.87–5.11)
RDW: 14.9 % (ref 11.5–15.5)
WBC: 7.8 10*3/uL (ref 4.0–10.5)

## 2023-02-26 ENCOUNTER — Ambulatory Visit (INDEPENDENT_AMBULATORY_CARE_PROVIDER_SITE_OTHER): Payer: Medicare HMO | Admitting: Dermatology

## 2023-02-26 ENCOUNTER — Encounter: Payer: Self-pay | Admitting: Dermatology

## 2023-02-26 DIAGNOSIS — Z79899 Other long term (current) drug therapy: Secondary | ICD-10-CM | POA: Diagnosis not present

## 2023-02-26 DIAGNOSIS — L2089 Other atopic dermatitis: Secondary | ICD-10-CM

## 2023-02-26 DIAGNOSIS — D2271 Melanocytic nevi of right lower limb, including hip: Secondary | ICD-10-CM

## 2023-02-26 DIAGNOSIS — Z7189 Other specified counseling: Secondary | ICD-10-CM

## 2023-02-26 MED ORDER — MOMETASONE FUROATE 0.1 % EX CREA: TOPICAL_CREAM | CUTANEOUS | 2 refills | Status: AC

## 2023-02-26 MED ORDER — EUCRISA 2 % EX OINT: TOPICAL_OINTMENT | CUTANEOUS | 2 refills | Status: DC

## 2023-02-26 NOTE — Patient Instructions (Addendum)
Start Mometasone twice daily to affected areas rubbed by seatbelt.  Avoid applying to face, groin, and axilla. Use as directed. Long-term use can cause thinning of the skin.   Eucrisa not covered by insurance.    Due to recent changes in healthcare laws, you may see results of your pathology and/or laboratory studies on MyChart before the doctors have had a chance to review them. We understand that in some cases there may be results that are confusing or concerning to you. Please understand that not all results are received at the same time and often the doctors may need to interpret multiple results in order to provide you with the best plan of care or course of treatment. Therefore, we ask that you please give [place] 2 business days to thoroughly review all your results before contacting the office for clarification. Should we see a critical lab result, you will be contacted sooner.   If You Need Anything After Your Visit  If you have any questions or concerns for your doctor, please call our main line at [phone] and press option 4 to reach your doctor's medical assistant. If no one answers, please leave a voicemail as directed and we will return your call as soon as possible. Messages left after 4 pm will be answered the following business day.   You may also send [place] a message via MyChart. We typically respond to MyChart messages within 1-2 business days.  For prescription refills, please ask your pharmacy to contact our office. Our fax number is [phone].  If you have an urgent issue when the clinic is closed that cannot wait until the next business day, you can page your doctor at the number below.    Please note that while we do our best to be available for urgent issues outside of office hours, we are not available 24/7.   If you have an urgent issue and are unable to reach [place], you may choose to seek medical care at your doctor's office, retail clinic, urgent care center, or  emergency room.  If you have a medical emergency, please immediately call 911 or go to the emergency department.  Pager Numbers  - Dr. Gwen Pounds: [phone]  - Dr. Roseanne Reno: [phone]  - Dr. Katrinka Blazing: [phone]   In the event of inclement weather, please call our main line at [phone] for an update on the status of any delays or closures.  Dermatology Medication Tips: Please keep the boxes that topical medications come in in order to help keep track of the instructions about where and how to use these. Pharmacies typically print the medication instructions only on the boxes and not directly on the medication tubes.   If your medication is too expensive, please contact our office at [phone] option 4 or send [place] a message through MyChart.   We are unable to tell what your co-pay for medications will be in advance as this is different depending on your insurance coverage. However, we may be able to find a substitute medication at lower cost or fill out paperwork to get insurance to cover a needed medication.   If a prior authorization is required to get your medication covered by your insurance company, please allow [place] 1-2 business days to complete this process.  Drug prices often vary depending on where the prescription is filled and some pharmacies may offer cheaper prices.  The website www.goodrx.com contains coupons for medications through different pharmacies. The prices here do not account for what the cost  may be with help from insurance (it may be cheaper with your insurance), but the website can give you the price if you did not use any insurance.  - You can print the associated coupon and take it with your prescription to the pharmacy.  - You may also stop by our office during regular business hours and pick up a GoodRx coupon card.  - If you need your prescription sent electronically to a different pharmacy, notify our office through [employer] or by phone at  [phone] option 4.     Si Usted Necesita Algo Despus de Su Visita  Tambin puede enviarnos un mensaje a travs de Clinical cytogeneticist. Por lo general respondemos a los mensajes de MyChart en el transcurso de 1 a 2 das hbiles.  Para renovar recetas, por favor pida a su farmacia que se ponga en contacto con nuestra oficina. Annie Sable de fax es [place] [phone].  Si tiene un asunto urgente cuando la clnica est cerrada y que no puede esperar hasta el siguiente da hbil, puede llamar/localizar a su doctor(a) al nmero que aparece a continuacin.   Por favor, tenga en cuenta que aunque hacemos todo lo posible para estar disponibles para asuntos urgentes fuera del horario de [place], no estamos disponibles las 24 horas del da, los 7 [address]  [address] de la [place].   Si tiene un problema urgente y no puede comunicarse con nosotros, puede optar por buscar atencin mdica  en el consultorio de su doctor(a), en una clnica privada, en un centro de atencin urgente o en una sala de emergencias.  Si tiene Engineer, drilling, por favor llame inmediatamente al 911 o vaya a la sala de emergencias.  Nmeros de bper  - Dr. Gwen Pounds: [phone]  - Dra. Roseanne Reno: [phone]  - Dr. Katrinka Blazing: [phone]   En caso de inclemencias del tiempo, por favor llame a Lacy Duverney principal al [phone] para una actualizacin sobre el [place] de cualquier retraso o cierre.  Consejos para la medicacin en dermatologa: Por favor, guarde las cajas en las que vienen los medicamentos de uso tpico para ayudarle a seguir las instrucciones sobre dnde y cmo usarlos. Las farmacias generalmente imprimen las instrucciones del medicamento slo en las cajas y no directamente en los tubos del [place].   Si su medicamento es muy caro, por favor, pngase en contacto con Rolm Gala llamando al [phone] y presione la opcin 4 o envenos un mensaje a travs de Clinical cytogeneticist.   No podemos decirle cul ser su copago por los  medicamentos por adelantado ya que esto es diferente dependiendo de la cobertura de su seguro. Sin embargo, es posible que podamos encontrar un medicamento sustituto a Audiological scientist un formulario para que el seguro cubra el medicamento que se considera necesario.   Si se requiere una autorizacin previa para que su compaa de seguros [place] su medicamento, por favor permtanos de 1 a 2 das hbiles para completar [address]  Los precios de los medicamentos varan con frecuencia dependiendo del Environmental consultant de dnde se surte la receta y alguna farmacias pueden ofrecer precios ms baratos.  El sitio web www.goodrx.com tiene cupones para medicamentos de Health and safety inspector. Los precios aqu no tienen en cuenta lo que podra costar con la ayuda del seguro (puede ser ms barato con su seguro), pero el sitio web puede darle el precio si no utiliz Tourist information centre manager.  - Puede imprimir el cupn correspondiente y llevarlo con su receta a la farmacia.  - Tambin puede pasar por  nuestra oficina durante el horario de atencin regular y Education officer, museum una tarjeta de cupones de GoodRx.  - Si necesita que su receta se enve electrnicamente a una farmacia diferente, informe a nuestra oficina a travs de MyChart de [place] o por telfono llamando al [phone] y presione la opcin 4.

## 2023-02-26 NOTE — Progress Notes (Signed)
   Follow-Up Visit   Subjective  Amanda Snyder is a 27 y.o. female who presents for the following: 4 month recheck of mole on right second toe. Has been there since birth. Mother is unaware of any changes.   The patient has spots, moles and lesions to be evaluated, some may be new or changing and the patient may have concern these could be cancer.    The following portions of the chart were reviewed this encounter and updated as appropriate: medications, allergies, medical history  Review of Systems:  No other skin or systemic complaints except as noted in HPI or Assessment and Plan.  Objective  Well appearing patient in no apparent distress; mood and affect are within normal limits.  A focused examination was performed of the following areas: Right foot  Relevant physical exam findings are noted in the Assessment and Plan.    Assessment & Plan   Atopic Dermatitis Exam: Scaly pink papules coalescing to plaques Treatment Plan: Start Mometasone twice daily to affected areas rubbed by seatbelt.  Avoid applying to face, groin, and axilla. Use as directed. Long-term use can cause thinning of the skin.  Eucrisa not covered by insurance.  MELANOCYTIC NEVI Exam: 0.35 x 0.3 cm brown macule at tip of right 2nd toe   Nevus -patient's mother states its been there since birth without changes.  There is a slight irregularity. Recommend rechecking on follow-up with photos today and if change at any point the future recommend removal.   Treatment Plan: Benign-appearing. Stable compared to previous visit. Observation.  Call clinic for new or changing moles.  Recommend daily use of broad spectrum spf 30+ sunscreen to sun-exposed areas.    Return in about 1 year (around 02/26/2024) for [facility].  I, Lawson Radar, CMA, am acting as scribe for Armida Sans, MD.   Documentation: I have reviewed the above documentation for accuracy and completeness, and I agree with the  above.  Armida Sans, MD

## 2023-03-10 ENCOUNTER — Other Ambulatory Visit: Payer: Self-pay

## 2023-03-10 ENCOUNTER — Inpatient Hospital Stay: Payer: Medicare HMO | Attending: Hematology and Oncology | Admitting: Hematology and Oncology

## 2023-03-10 ENCOUNTER — Inpatient Hospital Stay: Payer: Medicare HMO

## 2023-03-10 VITALS — BP 116/68 | HR 97 | Temp 98.0°F | Resp 18 | Ht 61.0 in | Wt 287.8 lb

## 2023-03-10 DIAGNOSIS — D75839 Thrombocytosis, unspecified: Secondary | ICD-10-CM | POA: Insufficient documentation

## 2023-03-10 DIAGNOSIS — D509 Iron deficiency anemia, unspecified: Secondary | ICD-10-CM | POA: Diagnosis not present

## 2023-03-10 NOTE — Progress Notes (Signed)
Patient Care Team: Glori Luis, MD as PCP - General (Family Medicine) Chilton Si, MD as PCP - Cardiology (Cardiology) Sharene Skeans Deanna Artis, MD (Inactive) as Consulting Physician (Pediatrics)  DIAGNOSIS:  Encounter Diagnosis  Name Primary?   Thrombocytosis Yes      CHIEF COMPLIANT: Follow-up of thrombocytosis  HISTORY OF PRESENT ILLNESS: History was provided by patient's mother.   Patient has developmental disability.  History of Present Illness   The patient, with a history of elevated platelet count, presents for a follow-up visit. The patient's platelet count has been consistently between 400-470 for the past five years. The patient has no symptoms related to this condition. She also has microcytic anemia which is felt to be related to Iron def and she is on iron supplement.         ALLERGIES:  is allergic to clams [shellfish allergy] and other.  MEDICATIONS:  Current Outpatient Medications  Medication Sig Dispense Refill   blood glucose meter kit and supplies KIT Dispense one touch ultra. Use 3 times daily as directed. (FOR ICD-10 E11.9). 1 each 0   Crisaborole (EUCRISA) 2 % OINT Apply twice daily to affected areas rubbed by seatbelt 60 g 2   EPINEPHrine (EPIPEN 2-PAK) 0.3 mg/0.3 mL IJ SOAJ injection Inject 0.3 mLs (0.3 mg total) into the muscle once as needed (anaphylaxis). 1 Device 0   ferrous sulfate 325 (65 FE) MG tablet TAKE 1 TABLET BY MOUTH EVERY DAY WITH BREAKFAST 90 tablet 1   glucose blood test strip Dispense one touch ultra. Use 3 times daily as directed. (FOR ICD-10 E11.9). 100 each 12   Lancets (ONETOUCH DELICA PLUS LANCET33G) MISC Apply topically 3 (three) times daily.     metFORMIN (GLUCOPHAGE-XR) 500 MG 24 hr tablet TAKE 3 TABLETS (1,500 MG TOTAL) BY MOUTH DAILY WITH BREAKFAST 270 tablet 1   metoprolol succinate (TOPROL-XL) 50 MG 24 hr tablet Take 1 tablet (50 mg total) by mouth daily. Take with or immediately following a meal. 90 tablet 3    mometasone (ELOCON) 0.1 % cream Apply twice daily to affected areas rubbed by seatbelt. Avoid applying to face, groin, and axilla. 15 g 2   No current facility-administered medications for this visit.    PHYSICAL EXAMINATION: ECOG PERFORMANCE STATUS: 1 - Symptomatic but completely ambulatory  Vitals:   03/10/23 1304  BP: 116/68  Pulse: 97  Resp: 18  Temp: 98 F (36.7 C)  SpO2: 100%   Filed Weights   03/10/23 1304  Weight: 287 lb 12.8 oz (130.5 kg)      LABORATORY DATA:  I have reviewed the data as listed    Latest Ref Rng & Units 05/31/2022   11:28 AM 02/22/2022   11:19 AM 08/06/2021    9:18 AM  CMP  Glucose 70 - 99 mg/dL 92  119  86   BUN 6 - 23 mg/dL 14  12  11    Creatinine 0.40 - 1.20 mg/dL 1.47  8.29  5.62   Sodium 135 - 145 mEq/L 136  135  137   Potassium 3.5 - 5.1 mEq/L 3.9  3.6  3.9   Chloride 96 - 112 mEq/L 102  103  100   CO2 19 - 32 mEq/L 26  26  29    Calcium 8.4 - 10.5 mg/dL 9.3  8.8  9.6   Total Protein 6.0 - 8.3 g/dL 6.7  7.1    Total Bilirubin 0.2 - 1.2 mg/dL 0.3  0.3    Alkaline Phos 39 -  117 U/L 53  47    AST 0 - 37 U/L 11  11    ALT 0 - 35 U/L 14  15      Lab Results  Component Value Date   WBC 7.8 02/03/2023   HGB 11.6 (L) 02/03/2023   HCT 38.4 02/03/2023   MCV 72.6 (L) 02/03/2023   PLT 449.0 (H) 02/03/2023   NEUTROABS 3.5 05/31/2022    ASSESSMENT & PLAN:  Thrombocytosis (HCC) Lab review: 06/19/2019: Platelets 397, MCV 71.7 05/31/2022: Platelets 425, MCV 71.3, hemoglobin 12.1, iron saturation 28%, ferritin 22.1 02/03/2023: Platelets 449, MCV 72.6, hemoglobin 11.6, WBC 7.8  Thrombocytosis: Mild thrombocytosis  Differential diagnosis 1. Primary thrombocytosis: Unlikely since the platelet count has been fairly stable for so many years. 2. Secondary/reactive thrombocytosis Different causes including infections, inflammation, iron deficiency.        Elevated Platelet Count Persistent elevation in platelet count (400-470) likely secondary  to underlying inflammation, possibly related to fatty liver and also Iron deficiency. No significant change over the past 5 years. No concern for bone marrow conditions such as essential thrombocytosis as platelet count has not escalated .   -No intervention required at this time. Monitor platelet count and consult me back if platelet count exceeds 700.      No orders of the defined types were placed in this encounter.  The patient has a good understanding of the overall plan. she agrees with it. she will call with any problems that may develop before the next visit here. Total time spent: 30 mins including face to face time and time spent for planning, charting and co-ordination of care   Tamsen Meek, MD 03/10/23

## 2023-03-10 NOTE — Assessment & Plan Note (Signed)
Lab review: 06/19/2019: Platelets 397, MCV 71.7 05/31/2022: Platelets 425, MCV 71.3, hemoglobin 12.1, iron saturation 28%, ferritin 22.1 02/03/2023: Platelets 449, MCV 72.6, hemoglobin 11.6, WBC 7.8  Thrombocytosis: Mild thrombocytosis  Differential diagnosis 1. Primary thrombocytosis: Related to myeloproliferative disorders of the bone marrow especially essential thrombocytosis and CML. I would like to send for JAK-2 mutation testings. Patient understands that JAK2 mutation is only present in 50% of essential thrombocytosis so the test is advantageous only if it is positive. If it is negative, it does not rule out. 2. Secondary/reactive thrombocytosis Different causes including infections, inflammation, iron deficiency.  I would like to send out for C-reactive protein, iron studies with ferritin to complete the workup.  Treatment options: 1. If it is primary essential thrombocytosis, treatment would depend on platelet count level as well as history of thrombosis. A. For low risk patients, (platelet counts less than 1000 and no history of blood clots) the treatment would be with aspirin therapy B. for high risk patients(platelet counts greater than 1000/history of blood clot) the treatment would be platelet lowering therapy with aspirin 2. Treatment of secondary thrombocytosis would be to treat underlying cause. There would not be any risk of thrombosis with secondary thrombocytosis.  I discussed with the patient that the microcytosis could be the result of thalassemia.  Therefore I would like to send for hemoglobin after pheresis as well. Return to clinic in 3 weeks to discuss the results of these tests.

## 2023-03-26 ENCOUNTER — Ambulatory Visit: Payer: Medicare HMO | Admitting: *Deleted

## 2023-03-26 VITALS — Ht 61.0 in | Wt 283.0 lb

## 2023-03-26 DIAGNOSIS — Z0000 Encounter for general adult medical examination without abnormal findings: Secondary | ICD-10-CM

## 2023-03-26 NOTE — Patient Instructions (Signed)
Amanda Snyder , Thank you for taking time to come for your Medicare Wellness Visit. I appreciate your ongoing commitment to your health goals. Please review the following plan we discussed and let me know if I can assist you in the future.   Referrals/Orders/Follow-Ups/Clinician Recommendations: Consider updating vaccines.  This is a list of the screening recommended for you and due dates:  Health Maintenance  Topic Date Due   COVID-19 Vaccine (1) Never done   Eye exam for diabetics  Never done   Flu Shot  Never done   Complete foot exam   01/30/2023   Pap Smear  06/01/2023*   Yearly kidney function blood test for diabetes  06/01/2023   Hemoglobin A1C  08/04/2023   Yearly kidney health urinalysis for diabetes  02/03/2024   Medicare Annual Wellness Visit  03/25/2024   Hepatitis C Screening  Completed   HIV Screening  Completed   HPV Vaccine  Aged Out   DTaP/Tdap/Td vaccine  Discontinued  *Topic was postponed. The date shown is not the original due date.    Advanced directives: (In Chart) A copy of your advanced directives are scanned into your chart should your provider ever need it.  Next Medicare Annual Wellness Visit scheduled for next year: Yes 03/30/24 @ 10:10

## 2023-03-26 NOTE — Progress Notes (Signed)
Subjective:   Amanda Snyder is a 27 y.o. female who presents for Medicare Annual (Subsequent) preventive examination.  Visit Complete: Virtual I connected with  Amanda Snyder on 03/26/23 by a audio enabled telemedicine application and verified that I am speaking with the correct person using two identifiers.Visit was completed by patient's mom and guardian Amanda Snyder. Patient has disability.  Patient Location: Home  Provider Location: Office/Clinic  I discussed the limitations of evaluation and management by telemedicine. The patient expressed understanding and agreed to proceed.  Vital Signs: Because this visit was a virtual/telehealth visit, some criteria may be missing or patient reported. Any vitals not documented were not able to be obtained and vitals that have been documented are patient reported.   Cardiac Risk Factors include: diabetes mellitus;hypertension;obesity (BMI >30kg/m2)     Objective:    Today's Vitals   03/26/23 0952  Weight: 283 lb (128.4 kg)  Height: 5\' 1"  (1.549 m)   Body mass index is 53.47 kg/m.     03/26/2023   10:09 AM 06/19/2019    1:01 PM 06/17/2019    3:00 PM 06/15/2019    1:00 PM 07/05/2016    2:57 PM  Advanced Directives  Does Patient Have a Medical Advance Directive? Yes No No No No  Type of Estate agent of [place];Living will      Does patient want to make changes to medical advance directive? No - Patient declined      Copy of Healthcare Power of Attorney in Chart? Yes - validated most recent copy scanned in chart (See row information)      Would patient like information on creating a medical advance directive?    Yes (Inpatient - patient requests chaplain consult to create a medical advance directive) No - Patient declined    Current Medications (verified) Outpatient Encounter Medications as of 03/26/2023  Medication Sig   bismuth subsalicylate (PEPTO BISMOL) 262 MG/15ML suspension Take 30 mLs by mouth  daily as needed.   blood glucose meter kit and supplies KIT Dispense one touch ultra. Use 3 times daily as directed. (FOR ICD-10 E11.9).   EPINEPHrine (EPIPEN 2-PAK) 0.3 mg/0.3 mL IJ SOAJ injection Inject 0.3 mLs (0.3 mg total) into the muscle once as needed (anaphylaxis).   ferrous sulfate 325 (65 FE) MG tablet TAKE 1 TABLET BY MOUTH EVERY DAY WITH BREAKFAST   glucose blood test strip Dispense one touch ultra. Use 3 times daily as directed. (FOR ICD-10 E11.9).   Lancets (ONETOUCH DELICA PLUS LANCET33G) MISC Apply topically 3 (three) times daily.   metFORMIN (GLUCOPHAGE-XR) 500 MG 24 hr tablet TAKE 3 TABLETS (1,500 MG TOTAL) BY MOUTH DAILY WITH BREAKFAST   mometasone (ELOCON) 0.1 % cream Apply twice daily to affected areas rubbed by seatbelt. Avoid applying to face, groin, and axilla.   Pseudoeph-Doxylamine-DM-APAP (NYQUIL PO) Take by mouth daily as needed.   Crisaborole (EUCRISA) 2 % OINT Apply twice daily to affected areas rubbed by seatbelt   metoprolol succinate (TOPROL-XL) 50 MG 24 hr tablet Take 1 tablet (50 mg total) by mouth daily. Take with or immediately following a meal.   No facility-administered encounter medications on file as of 03/26/2023.    Allergies (verified) Clams [shellfish allergy] and Other   History: Past Medical History:  Diagnosis Date   ADHD (attention deficit hyperactivity disorder) 2000   mom opted not to give medications (failed Adderall - 'zombielike', failed Strattera - 'no difference')   Allergy    suspected allergy to pollen or  dog(s)   Anemia    Diabetes mellitus without complication (HCC)    Hypertension    Mental retardation 1997   Severe MR. did not walk until age 37-3 years, received Early Intervention.   Nondisplaced fracture of proximal phalanx of right great toe, initial encounter for closed fracture 10/06/2017   Obesity 12   Premature birth with gestation of 35-36 weeks    [redacted] weeks GA   Seizure disorder (HCC) 01/29/2022   Formatting of this  note might be different from the original. as infant and on phenobarbital until 1998   Seizures (HCC) From age 38 month to 6 months   took Phenobarbital for a few years. weaned off after moving from [place] to [place]. Unknown etiology.   Shortness of breath 08/16/2019   Syncope 2006   witnessed by sister, no workup   Past Surgical History:  Procedure Laterality Date   NO PAST SURGERIES     Family History  Problem Relation Age of Onset   Stroke Mother 68   Psoriasis Mother    Alcohol abuse Mother    Hyperlipidemia Mother    Mental retardation Mother    Hypertension Mother    Alcohol abuse Brother    Other Father        Died from gunshot wound.   Cancer Maternal Grandmother 48       breast ca   Breast cancer Maternal Grandmother    Alcohol abuse Maternal Grandfather    Diabetes Paternal Grandmother    Social History   Socioeconomic History   Marital status: Single    Spouse name: Not on file   Number of children: 0   Years of education: Not on file   Highest education level: 12th grade  Occupational History   Occupation: Unemployed  Tobacco Use   Smoking status: Never   Smokeless tobacco: Never  Substance and Sexual Activity   Alcohol use: No    Alcohol/week: 0.0 standard drinks of alcohol   Drug use: No   Sexual activity: Not on file  Other Topics Concern   Not on file  Social History Narrative   Lives at home with mom and youngest sister (2 years younger).      Attends [employer] on [employer] - will age out at 19 years. 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 Has IEP. She will transition into a day program in June.      Mother maintains guardianship (beyond 72 years of age).      Right-handed.      1-2 caffeine drinks per week.      Social Drivers of Corporate investment banker Strain: Low Risk  (03/26/2023)   Overall Financial Resource Strain (CARDIA)    Difficulty of Paying Living Expenses: Not very  hard  Recent Concern: Financial Resource Strain - High Risk (02/03/2023)   Overall Financial Resource Strain (CARDIA)    Difficulty of Paying Living Expenses: Very hard  Food Insecurity: No Food Insecurity (03/26/2023)   Hunger Vital Sign    Worried About Running Out of Food in the Last Year: Never true    Ran Out of Food in the Last Year: Never true  Transportation Needs: No Transportation Needs (03/26/2023)   PRAPARE - Administrator, Civil Service (Medical): No    Lack of Transportation (Non-Medical): No  Physical Activity: Inactive (03/26/2023)   Exercise Vital Sign    Days of Exercise per Week: 0  days    Minutes of Exercise per Session: 0 min  Stress: No Stress Concern Present (03/26/2023)   [employer] of Occupational Health - Occupational Stress Questionnaire    Feeling of Stress : Not at all  Social Connections: Socially Isolated (03/26/2023)   Social Connection and Isolation Panel [NHANES]    Frequency of Communication with Friends and Family: More than three times a week    Frequency of Social Gatherings with Friends and Family: More than three times a week    Attends Religious Services: Never    Database administrator or Organizations: No    Attends Engineer, structural: Never    Marital Status: Never married    Tobacco Counseling Counseling given: Not Answered   Clinical Intake:  Pre-visit preparation completed: Yes  Pain : No/denies pain     BMI - recorded: 53.47 Nutritional Status: BMI > 30  Obese Nutritional Risks: None Diabetes: Yes CBG done?: No Did pt. bring in CBG monitor from home?: No  How often do you need to have someone help you when you read instructions, pamphlets, or other written materials from your doctor or pharmacy?: 1 - Never  Interpreter Needed?: No  Information entered by :: R. Jaedon Siler LPN   Activities of Daily Living    03/26/2023    9:55 AM  In your present state of health, do you have any difficulty  performing the following activities:  Hearing? 0  Vision? 0  Difficulty concentrating or making decisions? 0  Walking or climbing stairs? 0  Dressing or bathing? 1  Doing errands, shopping? 1  Preparing Food and eating ? Y  Comment unable to prepare food  Using the Toilet? Y  In the past six months, have you accidently leaked urine? Y  Comment wears pull ups  Do you have problems with loss of bowel control? Y  Managing your Medications? Y  Managing your Finances? Y  Housekeeping or managing your Housekeeping? Y    Patient Care Team: Glori Luis, MD as PCP - General (Family Medicine) Chilton Si, MD as PCP - Cardiology (Cardiology) Sharene Skeans Deanna Artis, MD (Inactive) as Consulting Physician (Pediatrics)  Indicate any recent Medical Services you may have received from other than [facility] providers in the past year (date may be approximate).     Assessment:   This is a routine wellness examination for Maiko.  Hearing/Vision screen Hearing Screening - Comments:: No issues Vision Screening - Comments:: Vision okay   Goals Addressed             This Visit's Progress    Patient Stated       Lose some weight and keep sugar under control       Depression Screen    03/26/2023   10:02 AM 02/03/2023    1:08 PM 07/31/2022   12:58 PM 01/29/2022   11:44 AM 07/31/2021   11:08 AM 01/31/2021   11:15 AM 02/01/2020   11:05 AM  PHQ 2/9 Scores  PHQ - 2 Score 0 0 0 0 0 0 0  PHQ- 9 Score 4 1 0        Fall Risk    03/26/2023    9:58 AM 02/03/2023    1:08 PM 07/31/2022   12:58 PM 01/31/2021   11:15 AM 02/01/2020   11:05 AM  Fall Risk   Falls in the past year? 0 0 0 0 0  Number falls in past yr: 0 0 0 0 0  Injury with  Fall? 0 0 0 0   Risk for fall due to : No Fall Risks No Fall Risks No Fall Risks    Follow up Falls prevention discussed;Falls evaluation completed Falls evaluation completed Falls evaluation completed Falls evaluation completed Falls evaluation  completed    MEDICARE RISK AT HOME: Medicare Risk at Home Any stairs in or around the home?: No If so, are there any without handrails?: No Home free of loose throw rugs in walkways, pet beds, electrical cords, etc?: Yes Adequate lighting in your home to reduce risk of falls?: Yes Life alert?: No Use of a cane, walker or w/c?: No Grab bars in the bathroom?: Yes Shower chair or bench in shower?: No Elevated toilet seat or a handicapped toilet?: Yes   Cognitive Function:        03/26/2023   10:10 AM  6CIT Screen  What Year? 4 points  What month? 3 points  What time? 3 points  Count back from 20 4 points  Months in reverse 4 points  Repeat phrase 10 points  Total Score 28 points    Immunizations Immunization History  Administered Date(s) Administered   DTaP 08/15/1993, 10/29/1993, 01/09/1994, 01/11/1995   HIB (PRP-OMP) 08/15/1993, 10/29/1993, 01/09/1994, 01/11/1995   Hepatitis B [birth year], 09/22/1993, 03/11/1994   IPV 08/15/1993, 10/29/1993, 01/09/1994   MMR 05/24/1997    TDAP status: Due, Education has been provided regarding the importance of this vaccine. Advised may receive this vaccine at local pharmacy or Health Dept. Aware to provide a copy of the vaccination record if obtained from local pharmacy or Health Dept. Verbalized acceptance and understanding.  Flu Vaccine status: Declined, Education has been provided regarding the importance of this vaccine but patient still declined. Advised may receive this vaccine at local pharmacy or Health Dept. Aware to provide a copy of the vaccination record if obtained from local pharmacy or Health Dept. Verbalized acceptance and understanding.   Covid-19 vaccine status: Declined, Education has been provided regarding the importance of this vaccine but patient still declined. Advised may receive this vaccine at local pharmacy or Health Dept.or vaccine clinic. Aware to provide a copy of the vaccination record if obtained from  local pharmacy or Health Dept. Verbalized acceptance and understanding.  Qualifies for Shingles Vaccine? No   Zostavax completed No   Shingrix Completed?: No.    Education has been provided regarding the importance of this vaccine. Patient has been advised to call insurance company to determine out of pocket expense if they have not yet received this vaccine. Advised may also receive vaccine at local pharmacy or Health Dept. Verbalized acceptance and understanding.  Screening Tests Health Maintenance  Topic Date Due   Medicare Annual Wellness (AWV)  Never done   COVID-19 Vaccine (1) Never done   OPHTHALMOLOGY EXAM  Never done   INFLUENZA VACCINE  Never done   FOOT EXAM  01/30/2023   Cervical Cancer Screening (Pap smear)  06/01/2023 (Originally 06/22/2014)   Diabetic kidney evaluation - eGFR measurement  06/01/2023   HEMOGLOBIN A1C  08/04/2023   Diabetic kidney evaluation - Urine ACR  02/03/2024   Hepatitis C Screening  Completed   HIV Screening  Completed   HPV VACCINES  Aged Out   DTaP/Tdap/Td  Discontinued    Health Maintenance  Health Maintenance Due  Topic Date Due   Medicare Annual Wellness (AWV)  Never done   COVID-19 Vaccine (1) Never done   OPHTHALMOLOGY EXAM  Never done   INFLUENZA VACCINE  Never done  FOOT EXAM  01/30/2023     Lung Cancer Screening: (Low Dose CT Chest recommended if Age 56-80 years, 20 pack-year currently smoking OR have quit w/in 15years.) does not qualify.     Additional Screening:  Hepatitis C Screening: does qualify; Completed 12/2017  Vision Screening: Recommended annual ophthalmology exams for early detection of glaucoma and other disorders of the eye. Is the patient up to date with their annual eye exam?  No  Who is the provider or what is the name of the office in which the patient attends annual eye exams? Does not have one Mom will check with her eye doctor [facility] If pt is not established with a provider, would they like to be  referred to a provider to establish care? No .   Dental Screening: Recommended annual dental exams for proper oral hygiene  Diabetic Foot Exam: Diabetic Foot Exam: Overdue, Pt has been advised about the importance in completing this exam. Pt is scheduled for diabetic foot exam on Needs to be done at next .office visit  Community Resource Referral / Chronic Care Management: CRR required this visit?  No   CCM required this visit?  No     Plan:     I have personally reviewed and noted the following in the patient's chart:   Medical and social history Use of alcohol, tobacco or illicit drugs  Current medications and supplements including opioid prescriptions. Patient is not currently taking opioid prescriptions. Functional ability and status Nutritional status Physical activity Advanced directives List of other physicians Hospitalizations, surgeries, and ER visits in previous 12 months Vitals Screenings to include cognitive, depression, and falls Referrals and appointments  In addition, I have reviewed and discussed with patient certain preventive protocols, quality metrics, and best practice recommendations. A written personalized care plan for preventive services as well as general preventive health recommendations were provided to patient.     Sydell Axon, LPN   [id number]   After Visit Summary: (MyChart) Due to this being a telephonic visit, the after visit summary with patients personalized plan was offered to patient via MyChart   Nurse Notes:Visit was completed by patient's mom Felicia because of patient's disability.

## 2023-05-30 ENCOUNTER — Telehealth: Payer: Self-pay

## 2023-05-30 NOTE — Telephone Encounter (Signed)
 Forms already signed. Received 2. Original forms where signed on 05/26/23 will fax today 05/30/23

## 2023-05-30 NOTE — Telephone Encounter (Signed)
 Received orders from [facility] for medical equipment order form. Placed in folder for review and signature

## 2023-07-17 ENCOUNTER — Other Ambulatory Visit: Payer: Self-pay

## 2023-07-17 DIAGNOSIS — E119 Type 2 diabetes mellitus without complications: Secondary | ICD-10-CM

## 2023-07-17 MED ORDER — METFORMIN HCL ER 500 MG PO TB24: ORAL_TABLET | ORAL | 0 refills | Status: DC

## 2023-07-21 ENCOUNTER — Encounter: Payer: Medicare HMO | Admitting: Family Medicine

## 2023-07-31 ENCOUNTER — Ambulatory Visit (INDEPENDENT_AMBULATORY_CARE_PROVIDER_SITE_OTHER)

## 2023-07-31 DIAGNOSIS — Z6841 Body Mass Index (BMI) 40.0 and over, adult: Secondary | ICD-10-CM | POA: Diagnosis not present

## 2023-07-31 DIAGNOSIS — Z13220 Encounter for screening for lipoid disorders: Secondary | ICD-10-CM | POA: Diagnosis not present

## 2023-07-31 DIAGNOSIS — E119 Type 2 diabetes mellitus without complications: Secondary | ICD-10-CM | POA: Diagnosis not present

## 2023-07-31 DIAGNOSIS — R55 Syncope and collapse: Secondary | ICD-10-CM | POA: Diagnosis not present

## 2023-07-31 DIAGNOSIS — E0849 Diabetes mellitus due to underlying condition with other diabetic neurological complication: Secondary | ICD-10-CM | POA: Insufficient documentation

## 2023-07-31 DIAGNOSIS — Z7984 Long term (current) use of oral hypoglycemic drugs: Secondary | ICD-10-CM

## 2023-07-31 DIAGNOSIS — D500 Iron deficiency anemia secondary to blood loss (chronic): Secondary | ICD-10-CM

## 2023-07-31 DIAGNOSIS — T782XXD Anaphylactic shock, unspecified, subsequent encounter: Secondary | ICD-10-CM

## 2023-07-31 DIAGNOSIS — T7800XA Anaphylactic reaction due to unspecified food, initial encounter: Secondary | ICD-10-CM

## 2023-07-31 LAB — CBC WITH DIFFERENTIAL/PLATELET
Basophils Absolute: 0 10*3/uL (ref 0.0–0.1)
Basophils Relative: 0.3 % (ref 0.0–3.0)
Eosinophils Absolute: 0.2 10*3/uL (ref 0.0–0.7)
Eosinophils Relative: 3.5 % (ref 0.0–5.0)
HCT: 38.5 % (ref 36.0–46.0)
Hemoglobin: 11.9 g/dL — ABNORMAL LOW (ref 12.0–15.0)
Lymphocytes Relative: 35.8 % (ref 12.0–46.0)
Lymphs Abs: 2.2 10*3/uL (ref 0.7–4.0)
MCHC: 30.8 g/dL (ref 30.0–36.0)
MCV: 72.2 fl — ABNORMAL LOW (ref 78.0–100.0)
Monocytes Absolute: 0.6 10*3/uL (ref 0.1–1.0)
Monocytes Relative: 9.2 % (ref 3.0–12.0)
Neutro Abs: 3.2 10*3/uL (ref 1.4–7.7)
Neutrophils Relative %: 51.2 % (ref 43.0–77.0)
Platelets: 405 10*3/uL — ABNORMAL HIGH (ref 150.0–400.0)
RBC: 5.33 Mil/uL — ABNORMAL HIGH (ref 3.87–5.11)
RDW: 14.6 % (ref 11.5–15.5)
WBC: 6.2 10*3/uL (ref 4.0–10.5)

## 2023-07-31 LAB — TSH: TSH: 1.29 u[IU]/mL (ref 0.35–5.50)

## 2023-07-31 LAB — IBC + FERRITIN
Ferritin: 24.9 ng/mL (ref 10.0–291.0)
Iron: 81 ug/dL (ref 42–145)
Saturation Ratios: 28.6 % (ref 20.0–50.0)
TIBC: 282.8 ug/dL (ref 250.0–450.0)
Transferrin: 202 mg/dL — ABNORMAL LOW (ref 212.0–360.0)

## 2023-07-31 LAB — COMPREHENSIVE METABOLIC PANEL WITH GFR
ALT: 22 U/L (ref 0–35)
AST: 16 U/L (ref 0–37)
Albumin: 3.9 g/dL (ref 3.5–5.2)
Alkaline Phosphatase: 51 U/L (ref 39–117)
BUN: 13 mg/dL (ref 6–23)
CO2: 25 meq/L (ref 19–32)
Calcium: 9.3 mg/dL (ref 8.4–10.5)
Chloride: 104 meq/L (ref 96–112)
Creatinine, Ser: 0.44 mg/dL (ref 0.40–1.20)
GFR: 130.08 mL/min (ref >60.00)
Glucose, Bld: 84 mg/dL (ref 70–99)
Potassium: 3.9 meq/L (ref 3.5–5.1)
Sodium: 137 meq/L (ref 135–145)
Total Bilirubin: 0.3 mg/dL (ref 0.2–1.2)
Total Protein: 7.3 g/dL (ref 6.0–8.3)

## 2023-07-31 LAB — LIPID PANEL
Cholesterol: 157 mg/dL (ref 0–200)
HDL: 46.4 mg/dL (ref >39.00)
LDL Cholesterol: 92 mg/dL (ref 0–99)
NonHDL: 110.9
Total CHOL/HDL Ratio: 3
Triglycerides: 97 mg/dL (ref 0.0–149.0)
VLDL: 19.4 mg/dL (ref 0.0–40.0)

## 2023-07-31 LAB — VITAMIN D 25 HYDROXY (VIT D DEFICIENCY, FRACTURES): VITD: 27.81 ng/mL — ABNORMAL LOW (ref 30.00–100.00)

## 2023-07-31 LAB — HEMOGLOBIN A1C: Hgb A1c MFr Bld: 6.1 % (ref 4.6–6.5)

## 2023-07-31 MED ORDER — FERROUS SULFATE 325 (65 FE) MG PO TABS: 325.0000 mg | ORAL_TABLET | Freq: Every day | ORAL | 1 refills | Status: DC

## 2023-07-31 MED ORDER — EPINEPHRINE 0.3 MG/0.3ML IJ SOAJ: 0.3000 mg | Freq: Once | INTRAMUSCULAR | 1 refills | Status: AC | PRN

## 2023-07-31 MED ORDER — EPINEPHRINE 0.3 MG/0.3ML IJ SOAJ: 0.3000 mg | Freq: Once | INTRAMUSCULAR | 0 refills | Status: DC | PRN

## 2023-07-31 MED ORDER — METFORMIN HCL ER 500 MG PO TB24: ORAL_TABLET | ORAL | 2 refills | Status: AC

## 2023-07-31 NOTE — Assessment & Plan Note (Signed)
 F/U with heme/onc. Continue management per heme/onc.

## 2023-07-31 NOTE — Assessment & Plan Note (Addendum)
 Counseled mom on increasing daily moderate intensity exercise, cutting on carbohydrate rich food. Check vitamin D  and TSH today.

## 2023-07-31 NOTE — Progress Notes (Signed)
 Established Patient Office Visit   Subjective  Patient ID: Amanda Snyder, female    DOB: [birth year]  Age: 28 y.o. MRN: [id number]  Chief Complaint  Patient presents with   Transitions Of Care    She  has a past medical history of ADHD (attention deficit hyperactivity disorder) (2000), Allergy, Anemia, Diabetes mellitus without complication (HCC), Hypertension, Mental retardation (1997), Nondisplaced fracture of proximal phalanx of right great toe, initial encounter for closed fracture (10/06/2017), Obesity (12), Premature birth with gestation of 35-36 weeks, Screening for lipoid disorders (08/08/2010), Seizure disorder (HCC) (01/29/2022), Seizures (HCC) (From age 80 month to 6 months), Shortness of breath (08/16/2019), and Syncope (2006).  Established patient of Dr. Lovetta Rucks presenting with her guardian and mother Amanda Snyder for transfer of care.    HPI 1) Patient was born premature at 58 weeks, had seizures causing mental disability. She is not able to maintain conversation. Only uses few words for communication. She lives with her mom who is also patient's primary care giver and guardian.  2) Patient has h/o diabetes: On Metformin  500 mg, twice a day. Patient sometimes get 3 tablets of Metformin  (1500 mg daily) when she eats more carbohydrate rich food. Patient is due for diabetic eye exam and mom plans on taking patient to her ophthalmologist for eye exam. Mom does not check blood glucose at home.   3) Tachycardia and hypertension: She is established with cardiology and take Metoprolol  50 mg daily.   4) She has a h/o thrombocytosis and sees heme/onc for it.   5) There was concern for patient's sleep pattern. She was referred to neurology with concern that sleep problem may have been related to seizure. She was seen by neurologist Dr. Gracie Lav on 07/23/22. Patient has not had seizure like episode since 05/27/22. She has been sleeping through the night, is getting about 7-8 hours of sleep  per night.   6) Iron deficiency: Patient was found to have low iron  on 02/22/22. She was started on oral iron supplement which showed improvement in iron level. She needs refill for iron. Mom reports patient has regular menses, bleeding for about 3 days. She is due for PAP. Mom plans on following up with ob/gyn for PAP.   7) H/O allergic reaction to sea food: Patient has epipen  prescribed to her. Mom is asking for a refill. Patient breaks out in hives when she ears certain sea food.   8) Patient goes for 20-30 minutes walk, twice a week. She does not drink alcohol. She like to spend time with her family, watch TV.   ROS As per HPI    Objective:     BP 116/70   Pulse 86   Temp (!) 97.3 F (36.3 C) (Axillary)   Ht 5\' 1"  (1.549 m)   Wt 287 lb (130.2 kg)   SpO2 97%   BMI 54.23 kg/m      03/26/2023   10:02 AM 02/03/2023    1:08 PM 07/31/2022   12:58 PM  Depression screen PHQ 2/9  Decreased Interest 0 0 0  Down, Depressed, Hopeless 0 0 0  PHQ - 2 Score 0 0 0  Altered sleeping 1 1 0  Tired, decreased energy 0 0 0  Change in appetite 0 0 0  Feeling bad or failure about yourself  0 0 0  Trouble concentrating 3 0 0  Moving slowly or fidgety/restless 0 0 0  Suicidal thoughts 0 0 0  PHQ-9 Score 4 1 0  Difficult doing  work/chores Not difficult at all Not difficult at all Not difficult at all      02/03/2023    1:08 PM 07/31/2022   12:58 PM  GAD 7 : Generalized Anxiety Score  Nervous, Anxious, on Edge 0 0  Control/stop worrying 0 0  Worry too much - different things 0 0  Trouble relaxing 0 0  Restless 0 0  Easily annoyed or irritable 0 0  Afraid - awful might happen 0 0  Total GAD 7 Score 0 0  Anxiety Difficulty Not difficult at all Not difficult at all    Physical Exam HENT:     Head: Normocephalic and atraumatic.  Eyes:     Extraocular Movements: Extraocular movements intact.  Cardiovascular:     Rate and Rhythm: Normal rate.     Pulses: Normal pulses.   Pulmonary:     Effort: Pulmonary effort is normal.     Breath sounds: No wheezing.  Abdominal:     General: Abdomen is protuberant.     Palpations: Abdomen is soft.  Musculoskeletal:     Cervical back: Neck supple.     Right lower leg: No edema.     Left lower leg: No edema.  Skin:    General: Skin is warm.  Neurological:     Mental Status: Mental status is at baseline.      Diabetic foot exam was performed with the following findings:   No deformities, ulcerations, or other skin breakdown Normal sensation of 10g monofilament Intact posterior tibialis and dorsalis pedis pulses Patient is non-verbal, when doing monofilament test patient reacted appropriately affirming intact sensation on b/l feet.      No results found for any visits on 07/31/23.  The ASCVD Risk score (Arnett DK, et al., 2019) failed to calculate for the following reasons:   The 2019 ASCVD risk score is only valid for ages 83 to 56    Assessment & Plan:  Morbid obesity (HCC) Assessment & Plan: Counseled mom on increasing daily moderate intensity exercise, cutting on carbohydrate rich food. Check vitamin D  and TSH today.  Orders: -     TSH -     VITAMIN D  25 Hydroxy (Vit-D Deficiency, Fractures)  Iron deficiency anemia due to chronic blood loss Assessment & Plan: Chronic issue.   Likely secondary to heavy menstrual cycles. Mom plans on scheduling appointment with ob/gyn for PAP and discuss measures to help reduce heavy menses. Mom is not interested in contraception to help reduce menstrual bleeding at this time. Check iron level, CBC today.   Orders: -     Ferrous Sulfate ; Take 1 tablet (325 mg total) by mouth daily with breakfast.  Dispense: 90 tablet; Refill: 1 -     CBC with Differential/Platelet -     IBC + Ferritin  Type 2 diabetes mellitus without complication, without long-term current use of insulin  (HCC) Assessment & Plan: Chronic issue.  Check A1c today. Continue metformin  XR 1000 mg  daily. Refill sent.   Orders: -     metFORMIN  HCl ER; Take one tablet twice a day  Dispense: 180 tablet; Refill: 2 -     Comprehensive metabolic panel with GFR -     Hemoglobin A1c  Collapse -     IBC + Ferritin  Anaphylaxis, subsequent encounter -     EPINEPHrine ; Inject 0.3 mg into the muscle once as needed (anaphylaxis).  Dispense: 1 each; Refill: 1  Screening for lipoid disorders Assessment & Plan: Check lipid panel today. Given  patient's h/o diabetes she should be on a moderate intensity statin. Once lipid panel is back I will reach out to her guardian with recommendation to start Rosuvastatin 10 mg daily.    Orders: -     Lipid panel  Anaphylaxis due to food Assessment & Plan: She has not had to use an EpiPen .  Her current EpiPen  is out of date.  Refill sent today.      Return in about 6 months (around 01/30/2024).   Jacklin Mascot, MD

## 2023-07-31 NOTE — Assessment & Plan Note (Signed)
 Check lipid panel today. Given patient's h/o diabetes she should be on a moderate intensity statin. Once lipid panel is back I will reach out to her guardian with recommendation to start Rosuvastatin 10 mg daily.

## 2023-07-31 NOTE — Assessment & Plan Note (Signed)
 She has not had to use an EpiPen .  Her current EpiPen  is out of date.  Refill sent today.

## 2023-07-31 NOTE — Assessment & Plan Note (Signed)
 Chronic issue.   Likely secondary to heavy menstrual cycles. Mom plans on scheduling appointment with ob/gyn for PAP and discuss measures to help reduce heavy menses. Mom is not interested in contraception to help reduce menstrual bleeding at this time. Check iron level, CBC today.

## 2023-07-31 NOTE — Assessment & Plan Note (Signed)
 Chronic issue.  Check A1c today. Continue metformin  XR 1000 mg daily. Refill sent.

## 2023-09-21 ENCOUNTER — Other Ambulatory Visit (HOSPITAL_BASED_OUTPATIENT_CLINIC_OR_DEPARTMENT_OTHER): Payer: Self-pay | Admitting: Cardiovascular Disease

## 2023-12-20 ENCOUNTER — Other Ambulatory Visit (HOSPITAL_BASED_OUTPATIENT_CLINIC_OR_DEPARTMENT_OTHER): Payer: Self-pay | Admitting: Cardiovascular Disease

## 2024-01-01 DIAGNOSIS — E119 Type 2 diabetes mellitus without complications: Secondary | ICD-10-CM | POA: Diagnosis not present

## 2024-01-01 LAB — HM DIABETES EYE EXAM

## 2024-01-15 ENCOUNTER — Other Ambulatory Visit (HOSPITAL_BASED_OUTPATIENT_CLINIC_OR_DEPARTMENT_OTHER): Payer: Self-pay | Admitting: Cardiovascular Disease

## 2024-01-15 ENCOUNTER — Encounter: Payer: Self-pay | Admitting: Pharmacist

## 2024-01-15 NOTE — Progress Notes (Signed)
 Pharmacy Quality Measure Review  This patient is appearing on a report for being at risk of failing the adherence measure for diabetes medications this calendar year.   Medication: metformin  XR 500 mg Last fill date: 07/17/23 for 90 day supply  Contact pharmacy to confirm refill status. No refill shown since April. [facility]: [phone]

## 2024-01-20 ENCOUNTER — Encounter: Payer: Self-pay | Admitting: Dermatology

## 2024-01-20 ENCOUNTER — Ambulatory Visit: Admitting: Dermatology

## 2024-01-20 DIAGNOSIS — Z7189 Other specified counseling: Secondary | ICD-10-CM

## 2024-01-20 DIAGNOSIS — D2271 Melanocytic nevi of right lower limb, including hip: Secondary | ICD-10-CM

## 2024-01-20 NOTE — Progress Notes (Signed)
   Follow-Up Visit   Subjective  Amanda Snyder is a 28 y.o. female who presents for the following: recheck a mole on her right foot, no changes per patient mom.    Mother  is with patient and contributes to history.   The following portions of the chart were reviewed this encounter and updated as appropriate: medications, allergies, medical history  Review of Systems:  No other skin or systemic complaints except as noted in HPI or Assessment and Plan.  Objective  Well appearing patient in no apparent distress; mood and affect are within normal limits.  A focused examination was performed of the following areas: Right foot  Relevant exam findings are noted in the Assessment and Plan.    Assessment & Plan   MELANOCYTIC NEVI Exam: 0.4 x 0.3 cm brown macule at tip of right 2nd toe   Nevus -patient's mother states its been there since birth without changes.  There is a slight irregularity. Recommend rechecking on follow-up with photos today and if change at any point the future recommend removal. Treatment Plan: Benign-appearing. Stable compared to previous visit. Observation.  Call clinic for new or changing moles.  Recommend daily use of broad spectrum spf 30+ sunscreen to sun-exposed areas.    MELANOCYTIC NEVUS OF RIGHT LOWER EXTREMITY   COUNSELING AND COORDINATION OF CARE    Return in about 1 year (around 01/19/2025).  IFay Kirks, CMA, am acting as scribe for Alm Rhyme, MD .   Documentation: I have reviewed the above documentation for accuracy and completeness, and I agree with the above.  Alm Rhyme, MD

## 2024-01-20 NOTE — Patient Instructions (Signed)

## 2024-01-25 ENCOUNTER — Other Ambulatory Visit: Payer: Self-pay

## 2024-01-25 DIAGNOSIS — D500 Iron deficiency anemia secondary to blood loss (chronic): Secondary | ICD-10-CM

## 2024-02-03 ENCOUNTER — Ambulatory Visit

## 2024-02-03 VITALS — BP 110/70 | HR 89 | Temp 97.9°F | Ht 60.0 in | Wt 297.0 lb

## 2024-02-03 DIAGNOSIS — E559 Vitamin D deficiency, unspecified: Secondary | ICD-10-CM | POA: Diagnosis not present

## 2024-02-03 DIAGNOSIS — Z7984 Long term (current) use of oral hypoglycemic drugs: Secondary | ICD-10-CM

## 2024-02-03 DIAGNOSIS — E1169 Type 2 diabetes mellitus with other specified complication: Secondary | ICD-10-CM | POA: Diagnosis not present

## 2024-02-03 DIAGNOSIS — D509 Iron deficiency anemia, unspecified: Secondary | ICD-10-CM | POA: Diagnosis not present

## 2024-02-03 DIAGNOSIS — D473 Essential (hemorrhagic) thrombocythemia: Secondary | ICD-10-CM

## 2024-02-03 NOTE — Assessment & Plan Note (Signed)
 Mildly low hemoglobin with low MCV on 07/2023. Iron deficiency suspected due to menstrual bleeding, managed with iron supplementation. Continue Ferrous sulfate  325 mg oral daily. Follow up with gynecology for further evaluation of menstrual bleeding.  Orders:   CBC w/Diff; Future

## 2024-02-03 NOTE — Assessment & Plan Note (Deleted)
 SABRA

## 2024-02-03 NOTE — Assessment & Plan Note (Signed)
 Previously seen by hematologist, last OV on 03/10/23. Suspected primary essential thrombocytosis and no treatment recommended during last visit.

## 2024-02-03 NOTE — Progress Notes (Signed)
 Established Patient Office Visit   Subjective  Patient ID: Amanda Snyder, female    DOB: 1995/10/06  Age: 28 y.o. MRN: [id number]  Chief Complaint  Patient presents with   Cough   Diabetes    Discussed the use of AI scribe software for clinical note transcription with the patient, who gave verbal consent to proceed.  History of Present Illness Amanda Snyder is a 28 year old female with diabetes who presents for a follow-up visit with her mom Amanda Snyder, patient born premature with complications contributing to communicate)    She has been managing her diabetes with metformin  500 mg twice daily.  Labs from 07/31/23 with A1c 6.1% discussed. She also takes vitamin D  2000 IU daily due to previously low levels and iron supplements due to concerns about iron deficiency potentially related to menstrual bleeding. She has upcoming appointment with ob/gyn on 03/25/24.  She has been experiencing a cough for the past few weeks, particularly noticeable when lying down. Her mother has been administering DayQuil and NyQuil, which have provided some relief as the cough was not present the previous night. There is a concern about her not expelling phlegm when coughing. No nasal discharge, fevers, or chills.  She had an eye exam in September with no issues and a dermatology follow-up for a benign skin condition that remains unchanged.  Her social activities have increased, and she is engaging more with the community, which she enjoys. She participates in yoga daily and is reducing her intake of sweets as part of her lifestyle adjustments.  ROS As per HPI    Objective:     BP 110/70 (BP Location: Right Arm, Patient Position: Sitting, Cuff Size: Normal)   Pulse 89   Temp 97.9 F (36.6 C) (Oral)   Ht 5' (1.524 m)   Wt 297 lb (134.7 kg)   SpO2 99%   BMI 58.00 kg/m      02/03/2024    1:13 PM 03/26/2023   10:02 AM 02/03/2023    1:08 PM  Depression screen PHQ 2/9  Decreased  Interest 0 0 0  Down, Depressed, Hopeless 0 0 0  PHQ - 2 Score 0 0 0  Altered sleeping 0 1 1  Tired, decreased energy 0 0 0  Change in appetite 0 0 0  Feeling bad or failure about yourself  0 0 0  Trouble concentrating 0 3 0  Moving slowly or fidgety/restless 0 0 0  Suicidal thoughts 0 0 0  PHQ-9 Score 0 4 1  Difficult doing work/chores Not difficult at all Not difficult at all Not difficult at all      02/03/2024    1:14 PM 02/03/2023    1:08 PM 07/31/2022   12:58 PM  GAD 7 : Generalized Anxiety Score  Nervous, Anxious, on Edge 0 0 0  Control/stop worrying 0 0 0  Worry too much - different things 0 0 0  Trouble relaxing 0 0 0  Restless 0 0 0  Easily annoyed or irritable 0 0 0  Afraid - awful might happen 0 0 0  Total GAD 7 Score 0 0 0  Anxiety Difficulty Not difficult at all Not difficult at all Not difficult at all      02/03/2024    1:13 PM 03/26/2023   10:02 AM 02/03/2023    1:08 PM  Depression screen PHQ 2/9  Decreased Interest 0 0 0  Down, Depressed, Hopeless 0 0 0  PHQ - 2 Score 0 0 0  Altered sleeping 0 1 1  Tired, decreased energy 0 0 0  Change in appetite 0 0 0  Feeling bad or failure about yourself  0 0 0  Trouble concentrating 0 3 0  Moving slowly or fidgety/restless 0 0 0  Suicidal thoughts 0 0 0  PHQ-9 Score 0 4 1  Difficult doing work/chores Not difficult at all Not difficult at all Not difficult at all      02/03/2024    1:14 PM 02/03/2023    1:08 PM 07/31/2022   12:58 PM  GAD 7 : Generalized Anxiety Score  Nervous, Anxious, on Edge 0 0 0  Control/stop worrying 0 0 0  Worry too much - different things 0 0 0  Trouble relaxing 0 0 0  Restless 0 0 0  Easily annoyed or irritable 0 0 0  Afraid - awful might happen 0 0 0  Total GAD 7 Score 0 0 0  Anxiety Difficulty Not difficult at all Not difficult at all Not difficult at all   SDOH Screenings   Food Insecurity: No Food Insecurity (07/31/2023)  Housing: Low Risk  (07/31/2023)  Transportation  Needs: No Transportation Needs (07/31/2023)  Utilities: Not At Risk (03/26/2023)  Alcohol Screen: Low Risk  (03/26/2023)  Depression (PHQ2-9): Low Risk  (02/03/2024)  Financial Resource Strain: Low Risk  (07/31/2023)  Physical Activity: Insufficiently Active (07/31/2023)  Social Connections: Moderately Isolated (07/31/2023)  Stress: No Stress Concern Present (07/31/2023)  Tobacco Use: Low Risk  (02/03/2024)  Health Literacy: Inadequate Health Literacy (03/26/2023)     Physical Exam Constitutional:      Appearance: She is obese.  HENT:     Head: Normocephalic and atraumatic.     Mouth/Throat:     Mouth: Mucous membranes are moist.  Neck:     Thyroid : No thyroid  mass or thyroid  tenderness.  Cardiovascular:     Rate and Rhythm: Normal rate and regular rhythm.  Pulmonary:     Effort: Pulmonary effort is normal.     Breath sounds: Normal breath sounds. No wheezing or rales.  Abdominal:     General: Bowel sounds are normal.     Palpations: Abdomen is soft.     Tenderness: There is no abdominal tenderness. There is no guarding.  Musculoskeletal:     Cervical back: Neck supple. No rigidity.     Right lower leg: No edema.     Left lower leg: No edema.  Skin:    General: Skin is warm.  Neurological:     Mental Status: She is alert. Mental status is at baseline.  Psychiatric:        Behavior: Behavior is cooperative.        No results found for any visits on 02/03/24.  The ASCVD Risk score (Arnett DK, et al., 2019) failed to calculate for the following reasons:   The 2019 ASCVD risk score is only valid for ages 64 to 96     Assessment & Plan:  Patient is a pleasant 28 year old female presenting with her mother/guardian  for diabetes follow up. Cough is resolved now. Recommend contact us  if cough returns. Assessment and plan discussed with patient's guardian.  Assessment & Plan Type 2 diabetes mellitus with other specified complication, without long-term current use of insulin   (HCC) Type 2 diabetes well-controlled, stable A1c levels. Continue Metformin  500 mg oral twice a day.  Follow up in six months for routine evaluation and labs including urine microalbumin, A1c. Check B12 level given use of  Metformin  during next visit.   Orders:   Urine Microalbumin w/creat. ratio; Future   HgB A1c; Future   Lipid panel; Future   B12; Future  Vitamin D  deficiency Vitamin D  deficiency managed with supplementation, advised continued use during winter. Continue Vitamin D  2000 IU daily, especially during winter months. Check vitamin D  level during f/u visit.  Orders:   Vitamin D  (25 hydroxy); Future  Iron deficiency anemia, unspecified iron deficiency anemia type Mildly low hemoglobin with low MCV on 07/2023. Iron deficiency suspected due to menstrual bleeding, managed with iron supplementation. Continue Ferrous sulfate  325 mg oral daily. Follow up with gynecology for further evaluation of menstrual bleeding.  Orders:   CBC w/Diff; Future  Essential thrombocytosis (HCC) Previously seen by hematologist, last OV on 03/10/23. Suspected primary essential thrombocytosis and no treatment recommended during last visit.     Morbid obesity (HCC) Chronic,  encourage regular physical activity. Advise reduction in sweets consumption.       Return in about 6 months (around 08/03/2024) for Chronic follow up and labs.   Luke Shade, MD

## 2024-02-03 NOTE — Assessment & Plan Note (Signed)
 Vitamin D  deficiency managed with supplementation, advised continued use during winter. Continue Vitamin D  2000 IU daily, especially during winter months. Check vitamin D  level during f/u visit.  Orders:   Vitamin D  (25 hydroxy); Future

## 2024-02-03 NOTE — Assessment & Plan Note (Addendum)
 Type 2 diabetes well-controlled, stable A1c levels. Continue Metformin  500 mg oral twice a day.  Follow up in six months for routine evaluation and labs including urine microalbumin, A1c. Check B12 level given use of Metformin  during next visit.   Orders:   Urine Microalbumin w/creat. ratio; Future   HgB A1c; Future   Lipid panel; Future   B12; Future

## 2024-02-03 NOTE — Assessment & Plan Note (Addendum)
 Chronic,  encourage regular physical activity. Advise reduction in sweets consumption.

## 2024-03-03 ENCOUNTER — Encounter (HOSPITAL_BASED_OUTPATIENT_CLINIC_OR_DEPARTMENT_OTHER): Payer: Self-pay | Admitting: Cardiovascular Disease

## 2024-03-03 ENCOUNTER — Ambulatory Visit (INDEPENDENT_AMBULATORY_CARE_PROVIDER_SITE_OTHER): Admitting: Cardiovascular Disease

## 2024-03-03 VITALS — BP 124/82 | HR 110 | Ht 61.0 in | Wt 296.0 lb

## 2024-03-03 DIAGNOSIS — G4733 Obstructive sleep apnea (adult) (pediatric): Secondary | ICD-10-CM | POA: Diagnosis not present

## 2024-03-03 DIAGNOSIS — I10 Essential (primary) hypertension: Secondary | ICD-10-CM

## 2024-03-03 DIAGNOSIS — I4711 Inappropriate sinus tachycardia, so stated: Secondary | ICD-10-CM | POA: Diagnosis not present

## 2024-03-03 DIAGNOSIS — K760 Fatty (change of) liver, not elsewhere classified: Secondary | ICD-10-CM

## 2024-03-03 NOTE — Patient Instructions (Signed)
 Medication Instructions:   Your physician recommends that you continue on your current medications as directed. Please refer to the Current Medication list given to you today.   *If you need a refill on your cardiac medications before your next appointment, please call your pharmacy*  Lab Work:  None ordered.  If you have labs (blood work) drawn today and your tests are completely normal, you will receive your results only by: MyChart Message (if you have MyChart) OR A paper copy in the mail If you have any lab test that is abnormal or we need to change your treatment, we will call you to review the results.  Testing/Procedures:  None ordered.  Follow-Up: At [facility], you and your health needs are our priority.  As part of our continuing mission to provide you with exceptional heart care, our providers are all part of one team.  This team includes your primary Cardiologist (physician) and Advanced Practice Providers or APPs (Physician Assistants and Nurse Practitioners) who all work together to provide you with the care you need, when you need it.  Your next appointment:   6 month(s)  Provider:   Annabella Scarce, MD    We recommend signing up for the patient portal called MyChart.  Sign up information is provided on this After Visit Summary.  MyChart is used to connect with patients for Virtual Visits (Telemedicine).  Patients are able to view lab/test results, encounter notes, upcoming appointments, etc.  Non-urgent messages can be sent to your provider as well.   To learn more about what you can do with MyChart, go to [email or web address].   Other Instructions  Your physician wants you to follow-up in: 6 months.  You will receive a reminder letter in the mail two months in advance. If you don't receive a letter, please call our office to schedule the follow-up appointment.

## 2024-03-03 NOTE — Progress Notes (Signed)
 q Cardiology Office Note:  .   Date:  03/03/2024  ID:  Amanda Snyder, DOB [birth year], MRN [id number] PCP: Abbey Bruckner, MD   HeartCare Providers Cardiologist:  Annabella Scarce, MD    History of Present Illness: Amanda Snyder    Amanda Snyder is a 28 y.o. female with inappropriate sinus tachycardia, developmental delay, seizures, syncope, DM, obesity, OSA not on CPAP, and hypertension here for follow-up.  She was initially seen for the evaluation of tachycardia.  She saw her PCP for episodes of hypertensive readings and tachycardia.  Her blood pressure can be as high as the 200s systolic.  Her heart rate was going into the 110s or 120s.  Her mother also reported episodes where she slid down the wall and look like she was going to pass out but did not.  During these times her blood pressure was low.  Her primary care doctor started her on metoprolol .     Amanda Snyder had an echo 09/2019 that revealed LVEF 60 to 65% with grade 1 diastolic dysfunction and was otherwise normal.  There is concern for pheochromocytoma so she had plasma metanephrines checked which were normal.     At her visit 10/2019 she was doing well.  Her heart rate was better controlled on metoprolol .  At her visit 08/2022 she reported a syncopal episode that occurred 05/2022.  It was unclear whether there was seizure activity after the syncope.  She wore a 3-day ZIO monitor that revealed rare PACs and PVCs and average heart rate of 108 bpm.  Metoprolol  was increased and transition to succinate.  She followed up with Reche Finder, NP 11/2022 and was doing well.  Heart rate was 86 bpm.    Discussed the use of AI scribe software for clinical note transcription with the patient, who gave verbal consent to proceed.  History of Present Illness Amanda Snyder's heart rate was elevated today, although her EKG and blood pressure were stable. She is on metoprolol , which she takes regularly without missing doses. She takes her medication regularly  without missing doses.  She has been engaging in more physical activities, including attending church on Sundays. She has started walking around her neighborhood for about 30 minutes per session. However, she experiences shortness of breath during these walks, which may be related to her current weight and lack of regular exercise.  Her diet includes regular consumption of [employer] breakfast sandwiches, which her wife suspects may contribute to weight gain. She also snacks on various items, and her wife intends to make healthier snacks more accessible.  Her cholesterol levels have improved, with total cholesterol decreasing from 168 to 151 and LDL from 98 to 86. Her wife has been making dietary changes, such as reducing butter intake, which may have contributed to these improvements.  Her A1c was 6.1 in April, and she is due for repeat blood work in six months.   ROS:  As per HPI  Studies Reviewed: Amanda Snyder   EKG Interpretation Date/Time:  Wednesday March 03 2024 10:25:56 EST Ventricular Rate:  110 PR Interval:  126 QRS Duration:  70 QT Interval:  320 QTC Calculation: 433 R Axis:   -22  Text Interpretation: Sinus tachycardia Since last tracing QT has shortened Confirmed by Scarce Annabella ([id number]) on 03/03/2024 10:30:19 AM   Echo 08/19/22: 3 day Zio Monitor   Quality: Fair.  Baseline artifact. Predominant rhythm: sinus tachycardia Average heart rate: 108 bpm Max heart rate: 155 bpm Min heart rate: 67 bpm   Rare (<  1%) PACs and PVCs   Risk Assessment/Calculations:             Physical Exam:   VS:  BP 124/82 (BP Location: Left Arm, Patient Position: Sitting, Cuff Size: Large)   Pulse (!) 110   Ht 5' 1 (1.549 m)   Wt 296 lb (134.3 kg)   SpO2 95%   BMI 55.93 kg/m  , BMI Body mass index is 55.93 kg/m. GENERAL:  Well appearing HEENT: Pupils equal round and reactive, fundi not visualized, oral mucosa unremarkable NECK:  No jugular venous distention, waveform within normal  limits, carotid upstroke brisk and symmetric, no bruits, no thyromegaly LUNGS:  Clear to auscultation bilaterally HEART:  RRR.  PMI not displaced or sustained,S1 and S2 within normal limits, no S3, no S4, no clicks, no rubs, no murmurs ABD:  Obese. Positive bowel sounds normal in frequency in pitch, no bruits, no rebound, no guarding, no midline pulsatile mass, no hepatomegaly, no splenomegaly EXT:  2 plus pulses throughout, no edema, no cyanosis no clubbing SKIN:  No rashes no nodules NEURO:  Cranial nerves II through XII grossly intact, motor grossly intact throughout PSYCH:  Intellectual disability.  Unable to assess.   ASSESSMENT AND PLAN: .    Assessment & Plan # Inappropriate sinus tachycardia Heart rate improved with metoprolol . EKG normal. Blood pressure controlled. - Continue metoprolol  25 mg daily.  # Primary hypertension Blood pressure well-controlled. - Continue current antihypertensive regimen.  # Morbid obesity Discussed dietary habits and impact of high-calorie foods. Encouraged increased physical activity. - Encouraged reduction in high-calorie food intake, such as [employer] sandwiches. - Promoted increased physical activity, including walking and leg exercises.  # Hyperlipidemia Cholesterol levels improved with dietary changes. Total cholesterol decreased from 168 to 151, LDL from 98 to 86. - Continue current dietary modifications to maintain cholesterol levels.  # Diabetes: A1c 6.1 %  Continue metformin  and increase exercise.     Dispo: f/u in 6 months  Signed, Annabella Scarce, MD

## 2024-03-25 ENCOUNTER — Other Ambulatory Visit (HOSPITAL_COMMUNITY)
Admission: RE | Admit: 2024-03-25 | Discharge: 2024-03-25 | Disposition: A | Discharge status: Home / Self Care | Source: Ambulatory Visit | Attending: Obstetrics and Gynecology | Admitting: Obstetrics and Gynecology

## 2024-03-25 ENCOUNTER — Ambulatory Visit: Admitting: Obstetrics and Gynecology

## 2024-03-25 VITALS — BP 114/83 | HR 87 | Wt 297.0 lb

## 2024-03-25 DIAGNOSIS — Z01419 Encounter for gynecological examination (general) (routine) without abnormal findings: Secondary | ICD-10-CM | POA: Insufficient documentation

## 2024-03-25 DIAGNOSIS — Z1151 Encounter for screening for human papillomavirus (HPV): Secondary | ICD-10-CM | POA: Diagnosis not present

## 2024-03-25 DIAGNOSIS — N939 Abnormal uterine and vaginal bleeding, unspecified: Secondary | ICD-10-CM

## 2024-03-25 DIAGNOSIS — Z124 Encounter for screening for malignant neoplasm of cervix: Secondary | ICD-10-CM

## 2024-03-25 NOTE — Progress Notes (Signed)
 NEW GYNECOLOGIC EXAM Patient name: Amanda Snyder MRN [id number]  Date of birth: [birth year] Chief Complaint:   Establish Care  History of Present Illness:   Amanda Snyder is a 28 y.o. No obstetric history on file. being seen today for a routine annual exam.  Current complaints: last pap was 3 years ago and was negative  Mother here providing history  Menstrual concerns? Yes  heavy periods - had to start iron tables, 4 days monthly, changes pull every 2 hours.  LMP earlier this month Boil on top of vaginal that appears intermittently - this one present since last month. Appears to be painful due to wince/flinch when the area is touched. Not sure if she is scratching. Will occasionally  Breast or nipple changes? No  Contraception use? abstinence Sexually active? No   At last attempt at pap, did ok but noted to have screamed/yelp at a point and examination topped.   Also reports heavy periods. Heavy on the first 2 days and passing blood clots and iron has gotten low prompting PCP to start iron supplementation and recommendation to be evaluated by GYN. No prior treatment for menses, does not display any pain with menses, but mom gives her meds when the clots are noticed for presumed discomfort. Has a boil that shows up on the same spot. Hair bearing part, top of mons - mom will sometimes put toothpase and bring it to a head and then drain. Unsure if there is itching; has been wearing pull ups. No other skin changes that have been noticed. Last abdomino-pelvic scan was in the ED. Hx of seizures, previously on phenobarb, but was stopped in childhood and has been without seizures since. Previously need anesthesia for imaging as well as dental evaluation. No IUD due to concern if it's lost, patient wouldn't be able to say there is an issue. Will take things off the skin, therefore patch incompatible. Tolerating iron and mom ok with continuing with just iron therapy.  Patient's last menstrual  period was 03/08/2024 (within days).   The pregnancy intention screening data noted above was reviewed. Potential methods of contraception were discussed. The patient elected to proceed with No data recorded.   Last pap 09/14/15 NILM Last mammogram: n/a.  Last colonoscopy: n/a.      02/03/2024    1:13 PM 03/26/2023   10:02 AM 02/03/2023    1:08 PM 07/31/2022   12:58 PM 01/29/2022   11:44 AM  Depression screen PHQ 2/9  Decreased Interest 0 0 0 0 0  Down, Depressed, Hopeless 0 0 0 0 0  PHQ - 2 Score 0 0 0 0 0  Altered sleeping 0 1 1 0   Tired, decreased energy 0 0 0 0   Change in appetite 0 0 0 0   Feeling bad or failure about yourself  0 0 0 0   Trouble concentrating 0 3 0 0   Moving slowly or fidgety/restless 0 0 0 0   Suicidal thoughts 0 0 0 0   PHQ-9 Score 0  4  1  0    Difficult doing work/chores Not difficult at all Not difficult at all Not difficult at all Not difficult at all      Data saved with a previous flowsheet row definition        02/03/2024    1:14 PM 02/03/2023    1:08 PM 07/31/2022   12:58 PM  GAD 7 : Generalized Anxiety Score  Nervous, Anxious, on Edge 0 0 0  Control/stop worrying 0 0 0  Worry too much - different things 0 0 0  Trouble relaxing 0 0 0  Restless 0 0 0  Easily annoyed or irritable 0 0 0  Afraid - awful might happen 0 0 0  Total GAD 7 Score 0 0 0  Anxiety Difficulty Not difficult at all Not difficult at all Not difficult at all     Review of Systems:   Pertinent items are noted in HPI Denies any headaches, blurred vision, fatigue, shortness of breath, chest pain, abdominal pain, abnormal vaginal discharge/itching/odor/irritation, problems with periods, bowel movements, urination, or intercourse unless otherwise stated above. Pertinent History Reviewed:  Reviewed past medical,surgical, social and family history.  Reviewed problem list, medications and allergies. Physical Assessment:   Vitals:   03/25/24 0932  BP: 114/83  Pulse:  87  Weight: 297 lb (134.7 kg)  Body mass index is 56.12 kg/m.        Physical Examination:   General appearance - well appearing, and in no distress  Mental status - alert  Psych:  She has a normal mood and affect  Skin - warm and dry, normal color, no suspicious lesions noted  Chest - effort normal  Pelvic -  VULVA: normal appearing vulva with 2cm and 5mm areas of induration without overlying erythema, tender to palpation, normal introitus VAGINA: normal appearing vagina with normal color and discharge, no lesions   CERVIX: not visualized   Thin prep pap is done with HR HPV cotesting  Extremities:  No swelling or varicosities noted  Chaperone present for exam  No results found for this or any previous visit (from the past 24 hours).    Assessment & Plan:  1. Abnormal uterine bleeding (AUB) (Primary) Discussed evaluation and management options. Ideally pelvic imaging to assess for structural contribution. Normal TSH and A1c within the last year. Given significant discomfort with attempted pap, likely will not tolerate transvaginal US . Will plan for exam under anesthesia with likely repeat pap smear, completion of pelvic US  and endometrial sampling. Will continue to monitor vulvar skin changes and recommend application of warm compress to help reduce area, may be secondary to irritation, no collection appreciated.  - Ambulatory Referral For Surgery Scheduling  2. Screening for cervical cancer Blind pap obtained will follow up results. If pap normal, can certainly space out to 5 years or extend if not tolerated since not sexually active and not being exposed to HPV - Cytology - PAP    Orders Placed This Encounter  Procedures   Ambulatory Referral For Surgery Scheduling    Meds: No orders of the defined types were placed in this encounter.   Follow-up: No follow-ups on file.  Carter Quarry, MD 03/25/2024 10:12 AM

## 2024-03-29 ENCOUNTER — Ambulatory Visit: Payer: Self-pay | Admitting: Obstetrics and Gynecology

## 2024-03-29 ENCOUNTER — Telehealth: Payer: Self-pay

## 2024-03-29 ENCOUNTER — Encounter (HOSPITAL_BASED_OUTPATIENT_CLINIC_OR_DEPARTMENT_OTHER): Payer: Self-pay | Admitting: Cardiovascular Disease

## 2024-03-29 LAB — CYTOLOGY - PAP
Adequacy: ABSENT
Comment: NEGATIVE
Diagnosis: NEGATIVE
High risk HPV: NEGATIVE

## 2024-03-29 MED ORDER — METOPROLOL SUCCINATE ER 50 MG PO TB24: 50.0000 mg | ORAL_TABLET | Freq: Every day | ORAL | 3 refills | Status: AC

## 2024-03-29 NOTE — Telephone Encounter (Signed)
 I called patient to see if she's available for surgery w/ Dr. Jeralyn on 04/12/24. I left a voicemail requesting a call back.

## 2024-03-30 ENCOUNTER — Ambulatory Visit: Payer: Medicare HMO

## 2024-03-30 VITALS — Wt 297.0 lb

## 2024-03-30 DIAGNOSIS — Z0000 Encounter for general adult medical examination without abnormal findings: Secondary | ICD-10-CM | POA: Diagnosis not present

## 2024-03-30 NOTE — Patient Instructions (Signed)
 Amanda Snyder,  Thank you for taking the time for your Medicare Wellness Visit. I appreciate your continued commitment to your health goals. Please review the care plan we discussed, and feel free to reach out if I can assist you further.  Please note that Annual Wellness Visits do not include a physical exam. Some assessments may be limited, especially if the visit was conducted virtually. If needed, we may recommend an in-person follow-up with your provider.  Ongoing Care Seeing your primary care provider every 3 to 6 months helps us  monitor your health and provide consistent, personalized care.   Referrals If a referral was made during today's visit and you haven't received any updates within two weeks, please contact the referred provider directly to check on the status.  Recommended Screenings:  Health Maintenance  Topic Date Due   COVID-19 Vaccine (1) Never done   Yearly kidney health urinalysis for diabetes  Never done   Pneumococcal Vaccine (1 of 2 - PCV) Never done   HPV Vaccine (1 - Risk 3-dose SCDM series) Never done   Hemoglobin A1C  01/30/2024   Medicare Annual Wellness Visit  03/25/2024   Flu Shot  07/06/2024*   Yearly kidney function blood test for diabetes  07/30/2024   Complete foot exam   07/30/2024   Eye exam for diabetics  12/31/2024   Pap with HPV screening  03/25/2029   Hepatitis C Screening  Completed   HIV Screening  Completed   Meningitis B Vaccine  Aged Out   DTaP/Tdap/Td vaccine  Discontinued   Hepatitis B Vaccine  Discontinued  *Topic was postponed. The date shown is not the original due date.       03/30/2024   10:34 AM  Advanced Directives  Does Patient Have a Medical Advance Directive? No  Would patient like information on creating a medical advance directive? No - Patient declined    Vision: Annual vision screenings are recommended for early detection of glaucoma, cataracts, and diabetic retinopathy. These exams can also reveal signs of  chronic conditions such as diabetes and high blood pressure.  Dental: Annual dental screenings help detect early signs of oral cancer, gum disease, and other conditions linked to overall health, including heart disease and diabetes.  Please see the attached documents for additional preventive care recommendations.

## 2024-03-30 NOTE — Progress Notes (Signed)
 "  Chief Complaint  Patient presents with   Medicare Wellness    SUBSEQUENT     Subjective:   Amanda Snyder is a 28 y.o. female who presents for a Medicare Annual Wellness Visit.  Visit info / Clinical Intake: Medicare Wellness Visit Type:: Subsequent Annual Wellness Visit Persons participating in visit and providing information:: patient & caregiver Medicare Wellness Visit Mode:: Telephone If telephone:: video error Since this visit was completed virtually, some vitals may be partially provided or unavailable. Missing vitals are due to the limitations of the virtual format.: Documented vitals are patient reported If Telephone or Video please confirm:: I connected with patient using audio/video enable telemedicine. I verified patient identity with two identifiers, discussed telehealth limitations, and patient agreed to proceed. Patient Location:: HOME Provider Location:: OFFICE Interpreter Needed?: No Pre-visit prep was completed: yes AWV questionnaire completed by patient prior to visit?: no Living arrangements:: with family/others Patient's Overall Health Status Rating: good Typical amount of pain: none Does pain affect daily life?: no Are you currently prescribed opioids?: no  Dietary Habits and Nutritional Risks How many meals a day?: 3 Eats fruit and vegetables daily?: yes (SOME) Most meals are obtained by: preparing own meals In the last 2 weeks, have you had any of the following?: none Diabetic:: no  Functional Status Activities of Daily Living (to include ambulation/medication): (!) Needs Assist Feeding: Independent Dressing/Grooming: Independent Bathing: Needs assistance Toileting: Independent Transfer: Independent Ambulation: Independent Medication Administration: Needs assistance (comment) Home Management (perform basic housework or laundry): Needs assistance (comment) Manage your own finances?: (!) no Primary transportation is: family / friends Concerns  about vision?: no *vision screening is required for [facility]* (NO VISION ISSUES; EYE DOCTOR: [facility]) Concerns about hearing?: no  Fall Screening Falls in the past year?: 0 Number of falls in past year: 0 Was there an injury with Fall?: 0 Fall Risk Category Calculator: 0 Patient Fall Risk Level: Low Fall Risk  Fall Risk Patient at Risk for Falls Due to: No Fall Risks Fall risk Follow up: Falls evaluation completed; Education provided  Home and Transportation Safety: All rugs have non-skid backing?: N/A, no rugs All stairs or steps have railings?: N/A, no stairs Grab bars in the bathtub or shower?: yes Have non-skid surface in bathtub or shower?: yes Good home lighting?: yes Regular seat belt use?: yes Hospital stays in the last year:: no  Cognitive Assessment Difficulty concentrating, remembering, or making decisions? : yes Will 6CIT or Mini Cog be Completed: no 6CIT or Mini Cog Declined: patient has a diagnosis of dementia or cognitive impairment  Advance Directives (For Healthcare) Does Patient Have a Medical Advance Directive?: No Would patient like information on creating a medical advance directive?: No - Patient declined  Reviewed/Updated  Reviewed/Updated: Reviewed All (Medical, Surgical, Family, Medications, Allergies, Care Teams, Patient Goals)    Allergies (verified) Clams [shellfish allergy] and Other   Current Medications (verified) Outpatient Encounter Medications as of 03/30/2024  Medication Sig   bismuth subsalicylate (PEPTO BISMOL) 262 MG/15ML suspension Take 30 mLs by mouth daily as needed.   blood glucose meter kit and supplies KIT Dispense one touch ultra. Use 3 times daily as directed. (FOR ICD-10 E11.9).   EPINEPHrine  (EPIPEN  2-PAK) 0.3 mg/0.3 mL IJ SOAJ injection Inject 0.3 mg into the muscle once as needed (anaphylaxis).   ferrous sulfate  325 (65 FE) MG tablet TAKE 1 TABLET BY MOUTH EVERY DAY WITH BREAKFAST   glucose blood test strip  Dispense one touch ultra. Use 3 times  daily as directed. (FOR ICD-10 E11.9).   Lancets (ONETOUCH DELICA PLUS LANCET33G) MISC Apply topically 3 (three) times daily.   metFORMIN  (GLUCOPHAGE -XR) 500 MG 24 hr tablet Take one tablet twice a day   mometasone  (ELOCON ) 0.1 % cream Apply twice daily to affected areas rubbed by seatbelt. Avoid applying to face, groin, and axilla.   metoprolol  succinate (TOPROL -XL) 50 MG 24 hr tablet Take 1 tablet (50 mg total) by mouth daily. Take with or immediately following a meal.   No facility-administered encounter medications on file as of 03/30/2024.    History: Past Medical History:  Diagnosis Date   ADHD (attention deficit hyperactivity disorder) 2000   mom opted not to give medications (failed Adderall - 'zombielike', failed Strattera - 'no difference')   Allergy    suspected allergy to pollen or dog(s)   Alteration of consciousness 07/23/2022   Anemia    Diabetes mellitus without complication (HCC)    Hypertension    Mental retardation 1997   Severe MR. did not walk until age 575-3 years, received Early Intervention.   Nondisplaced fracture of proximal phalanx of right great toe, initial encounter for closed fracture 10/06/2017   Obesity 2014   Premature birth with gestation of 35-36 weeks    [redacted] weeks GA   Screening for lipoid disorders 08/08/2010   Seizure disorder (HCC) 01/29/2022   Formatting of this note might be different from the original. as infant and on phenobarbital until 1998   Seizures (HCC) From age 57 month to 6 months   took Phenobarbital for a few years. weaned off after moving from [place] to [place]. Unknown etiology.   Shortness of breath 08/16/2019   Syncope 2006   witnessed by sister, no workup   Past Surgical History:  Procedure Laterality Date   NO PAST SURGERIES     Family History  Problem Relation Age of Onset   Stroke Mother 42   Psoriasis Mother    Alcohol abuse Mother    Hyperlipidemia Mother    Mental retardation Mother     Hypertension Mother    Alcohol abuse Brother    Other Father        Died from gunshot wound.   Cancer Maternal Grandmother 48       breast ca   Breast cancer Maternal Grandmother    Alcohol abuse Maternal Grandfather    Diabetes Paternal Grandmother    Social History   Occupational History   Occupation: Unemployed  Tobacco Use   Smoking status: Never    Passive exposure: Past   Smokeless tobacco: Never  Substance and Sexual Activity   Alcohol use: No    Alcohol/week: 0.0 standard drinks of alcohol   Drug use: No   Sexual activity: Not on file   Tobacco Counseling Counseling given: Not Answered  SDOH Screenings   Food Insecurity: No Food Insecurity (03/30/2024)  Housing: Low Risk (03/30/2024)  Transportation Needs: No Transportation Needs (03/30/2024)  Utilities: Not At Risk (03/30/2024)  Alcohol Screen: Low Risk (03/30/2024)  Depression (PHQ2-9): Low Risk (03/30/2024)  Financial Resource Strain: Low Risk (03/30/2024)  Physical Activity: Insufficiently Active (03/30/2024)  Social Connections: Moderately Isolated (03/30/2024)  Stress: No Stress Concern Present (03/30/2024)  Tobacco Use: Low Risk (03/30/2024)  Health Literacy: Patient Unable To Answer (03/30/2024)   See flowsheets for full screening details  Depression Screen Depression Screening Exception Documentation Depression Screening Exception:: Medical reason  PHQ 2 & 9 Depression Scale- Over the past 2 weeks, how often have you been bothered  by any of the following problems? Little interest or pleasure in doing things: 0 Feeling down, depressed, or hopeless (PHQ Adolescent also includes...irritable): 0 PHQ-2 Total Score: 0 Trouble falling or staying asleep, or sleeping too much: 0 Feeling tired or having little energy: 0 Poor appetite or overeating (PHQ Adolescent also includes...weight loss): 0 Feeling bad about yourself - or that you are a failure or have let yourself or your family down: 0 Trouble  concentrating on things, such as reading the newspaper or watching television (PHQ Adolescent also includes...like school work): 0 Moving or speaking so slowly that other people could have noticed. Or the opposite - being so fidgety or restless that you have been moving around a lot more than usual: 0 Thoughts that you would be better off dead, or of hurting yourself in some way: 0 PHQ-9 Total Score: 0 If you checked off any problems, how difficult have these problems made it for you to do your work, take care of things at home, or get along with other people?: Not difficult at all     Goals Addressed             This Visit's Progress    Client's mother understands the importance of follow-up with providers by attending scheduled visits.               Objective:    Today's Vitals   03/30/24 1033  Weight: 297 lb (134.7 kg)  PainSc: 0-No pain   Body mass index is 56.12 kg/m.  Hearing/Vision screen No results found. Immunizations and Health Maintenance Health Maintenance  Topic Date Due   COVID-19 Vaccine (1) Never done   Diabetic kidney evaluation - Urine ACR  Never done   Pneumococcal Vaccine (1 of 2 - PCV) Never done   HPV VACCINES (1 - Risk 3-dose SCDM series) Never done   HEMOGLOBIN A1C  01/30/2024   Influenza Vaccine  07/06/2024 (Originally 11/07/2023)   Diabetic kidney evaluation - eGFR measurement  07/30/2024   FOOT EXAM  07/30/2024   OPHTHALMOLOGY EXAM  12/31/2024   Medicare Annual Wellness (AWV)  03/30/2025   Cervical Cancer Screening (HPV/Pap Cotest)  03/25/2029   Hepatitis C Screening  Completed   HIV Screening  Completed   Meningococcal B Vaccine  Aged Out   DTaP/Tdap/Td  Discontinued   Hepatitis B Vaccines 19-59 Average Risk  Discontinued        Assessment/Plan:  This is a routine wellness examination for Amanda Snyder.  Patient Care Team: Bair, Kalpana, MD as PCP - General (Family Medicine) Raford Riggs, MD as PCP - Cardiology  (Cardiology) Susen Elsie DEL, MD (Inactive) as Consulting Physician (Pediatrics) Jeralyn Crutch, MD as Consulting Physician (Obstetrics and Gynecology) Hester Alm BROCKS, MD as Consulting Physician (Dermatology) Joshua Rush, OD as Consulting Physician (Optometry)  I have personally reviewed and noted the following in the patients chart:   Medical and social history Use of alcohol, tobacco or illicit drugs  Current medications and supplements including opioid prescriptions. Functional ability and status Nutritional status Physical activity Advanced directives List of other physicians Hospitalizations, surgeries, and ER visits in previous 12 months Vitals Screenings to include cognitive, depression, and falls Referrals and appointments  No orders of the defined types were placed in this encounter.  In addition, I have reviewed and discussed with patient certain preventive protocols, quality metrics, and best practice recommendations. A written personalized care plan for preventive services as well as general preventive health recommendations were provided to patient.   Amanda  LOISE Fuller, LPN   [id number]   Return in 1 year (on 03/30/2025).  After Visit Summary: (MyChart) Due to this being a telephonic visit, the after visit summary with patients personalized plan was offered to patient via MyChart   Nurse Notes: Patient's mother declined vaccines. "

## 2024-04-22 ENCOUNTER — Telehealth: Payer: Self-pay

## 2024-04-22 NOTE — Telephone Encounter (Signed)
 I called patient to see if she's available for surgery w Dr. Jeralyn on 04/29/24 or 05/06/24. I left a second voicemail requesting a call back to schedule.

## 2024-04-30 ENCOUNTER — Telehealth: Payer: Self-pay

## 2024-04-30 NOTE — Telephone Encounter (Signed)
 I called patient to see if she's available for surgery with Dr. Jeralyn on 05/06/24 at 12:15 pm. I left a detailed message and requested patient call back to schedule.

## 2024-08-04 ENCOUNTER — Ambulatory Visit
